# Patient Record
Sex: Female | Born: 1980 | Race: White | Hispanic: No | Smoking: Never smoker
Health system: Southern US, Community
[De-identification: ages and names within clinical notes are randomized; demographics above are authoritative.]

## PROBLEM LIST (undated history)

## (undated) DIAGNOSIS — IMO0002 Reserved for concepts with insufficient information to code with codable children: Secondary | ICD-10-CM

## (undated) DIAGNOSIS — D649 Anemia, unspecified: Secondary | ICD-10-CM

## (undated) DIAGNOSIS — Z800 Family history of malignant neoplasm of digestive organs: Secondary | ICD-10-CM

## (undated) DIAGNOSIS — D219 Benign neoplasm of connective and other soft tissue, unspecified: Secondary | ICD-10-CM

## (undated) DIAGNOSIS — R519 Headache, unspecified: Secondary | ICD-10-CM

## (undated) DIAGNOSIS — Z803 Family history of malignant neoplasm of breast: Secondary | ICD-10-CM

## (undated) DIAGNOSIS — R87619 Unspecified abnormal cytological findings in specimens from cervix uteri: Secondary | ICD-10-CM

## (undated) DIAGNOSIS — Z1501 Genetic susceptibility to malignant neoplasm of breast: Secondary | ICD-10-CM

## (undated) DIAGNOSIS — Z1509 Genetic susceptibility to other malignant neoplasm: Secondary | ICD-10-CM

## (undated) DIAGNOSIS — T7840XA Allergy, unspecified, initial encounter: Secondary | ICD-10-CM

## (undated) DIAGNOSIS — D500 Iron deficiency anemia secondary to blood loss (chronic): Secondary | ICD-10-CM

## (undated) DIAGNOSIS — I82890 Acute embolism and thrombosis of other specified veins: Secondary | ICD-10-CM

## (undated) HISTORY — DX: Headache, unspecified: R51.9

## (undated) HISTORY — DX: Allergy, unspecified, initial encounter: T78.40XA

## (undated) HISTORY — DX: Genetic susceptibility to other malignant neoplasm: Z15.09

## (undated) HISTORY — DX: Family history of malignant neoplasm of breast: Z80.3

## (undated) HISTORY — DX: Genetic susceptibility to malignant neoplasm of breast: Z15.01

## (undated) HISTORY — DX: Iron deficiency anemia secondary to blood loss (chronic): D50.0

## (undated) HISTORY — DX: Benign neoplasm of connective and other soft tissue, unspecified: D21.9

## (undated) HISTORY — PX: ABDOMINAL HYSTERECTOMY: SHX81

## (undated) HISTORY — DX: Family history of malignant neoplasm of digestive organs: Z80.0

## (undated) HISTORY — DX: Acute embolism and thrombosis of other specified veins: I82.890

## (undated) HISTORY — DX: Anemia, unspecified: D64.9

---

## 2004-10-27 LAB — RUBELLA IGG AB(REFL): Rubella: IMMUNE

## 2004-10-27 LAB — RUBEOLA ANTIBODY IGG: MEASLES (RUBEOLA) IGG,IFA: IMMUNE

## 2004-10-27 LAB — VARICELLA ZOSTER ANTIBODY, IGG: Varicella Zoster Ab IgM: IMMUNE

## 2004-10-27 LAB — CHG ANTIBODY MUMPS: Mumps IgG: IMMUNE

## 2006-09-08 ENCOUNTER — Other Ambulatory Visit: Admission: RE | Admit: 2006-09-08 | Discharge: 2006-09-08 | Payer: Self-pay | Admitting: Obstetrics and Gynecology

## 2006-10-12 ENCOUNTER — Emergency Department (HOSPITAL_COMMUNITY): Admission: EM | Admit: 2006-10-12 | Discharge: 2006-10-12 | Payer: Self-pay | Admitting: Emergency Medicine

## 2007-01-08 ENCOUNTER — Other Ambulatory Visit: Admission: RE | Admit: 2007-01-08 | Discharge: 2007-01-08 | Payer: Self-pay | Admitting: Obstetrics and Gynecology

## 2007-02-08 HISTORY — PX: COLPOSCOPY: SHX161

## 2008-05-14 ENCOUNTER — Ambulatory Visit: Payer: Self-pay | Admitting: Family Medicine

## 2008-05-14 DIAGNOSIS — J309 Allergic rhinitis, unspecified: Secondary | ICD-10-CM | POA: Insufficient documentation

## 2008-05-14 DIAGNOSIS — K219 Gastro-esophageal reflux disease without esophagitis: Secondary | ICD-10-CM | POA: Insufficient documentation

## 2008-05-22 ENCOUNTER — Telehealth: Payer: Self-pay | Admitting: Family Medicine

## 2008-05-22 DIAGNOSIS — R1084 Generalized abdominal pain: Secondary | ICD-10-CM | POA: Insufficient documentation

## 2008-06-18 ENCOUNTER — Ambulatory Visit: Payer: Self-pay | Admitting: Gastroenterology

## 2008-06-18 DIAGNOSIS — R1013 Epigastric pain: Secondary | ICD-10-CM | POA: Insufficient documentation

## 2008-06-18 DIAGNOSIS — K644 Residual hemorrhoidal skin tags: Secondary | ICD-10-CM | POA: Insufficient documentation

## 2008-06-18 DIAGNOSIS — K5900 Constipation, unspecified: Secondary | ICD-10-CM | POA: Insufficient documentation

## 2008-06-27 ENCOUNTER — Ambulatory Visit (HOSPITAL_COMMUNITY): Admission: RE | Admit: 2008-06-27 | Discharge: 2008-06-27 | Payer: Self-pay | Admitting: Gastroenterology

## 2008-11-28 ENCOUNTER — Ambulatory Visit: Payer: Self-pay | Admitting: Family Medicine

## 2008-11-28 DIAGNOSIS — R5383 Other fatigue: Secondary | ICD-10-CM

## 2008-11-28 DIAGNOSIS — R5381 Other malaise: Secondary | ICD-10-CM | POA: Insufficient documentation

## 2008-12-01 LAB — CONVERTED CEMR LAB
Albumin: 4.4 g/dL (ref 3.5–5.2)
BUN: 9 mg/dL (ref 6–23)
Basophils Absolute: 0 10*3/uL (ref 0.0–0.1)
Basophils Relative: 0 % (ref 0–1)
Calcium: 9.4 mg/dL (ref 8.4–10.5)
Direct LDL: 59 mg/dL
Eosinophils Relative: 1 % (ref 0–5)
Glucose, Bld: 96 mg/dL (ref 70–99)
HCT: 36.3 % (ref 36.0–46.0)
Hemoglobin: 12.1 g/dL (ref 12.0–15.0)
MCHC: 33.3 g/dL (ref 30.0–36.0)
Monocytes Absolute: 0.4 10*3/uL (ref 0.1–1.0)
Neutro Abs: 5.2 10*3/uL (ref 1.7–7.7)
RDW: 13.2 % (ref 11.5–15.5)
Total Bilirubin: 0.4 mg/dL (ref 0.3–1.2)

## 2009-01-19 ENCOUNTER — Telehealth: Payer: Self-pay | Admitting: Family Medicine

## 2009-01-19 ENCOUNTER — Ambulatory Visit: Payer: Self-pay | Admitting: Family Medicine

## 2009-01-19 DIAGNOSIS — J020 Streptococcal pharyngitis: Secondary | ICD-10-CM | POA: Insufficient documentation

## 2009-01-21 ENCOUNTER — Ambulatory Visit: Payer: Self-pay | Admitting: Family Medicine

## 2009-01-21 ENCOUNTER — Telehealth: Payer: Self-pay | Admitting: Family Medicine

## 2009-02-02 ENCOUNTER — Ambulatory Visit: Payer: Self-pay | Admitting: Family Medicine

## 2009-02-02 DIAGNOSIS — K120 Recurrent oral aphthae: Secondary | ICD-10-CM | POA: Insufficient documentation

## 2009-02-02 LAB — CONVERTED CEMR LAB
Specific Gravity, Urine: 1.02
Urobilinogen, UA: 0.2
WBC Urine, dipstick: NEGATIVE

## 2009-03-03 ENCOUNTER — Ambulatory Visit: Payer: Self-pay | Admitting: Family Medicine

## 2009-03-03 ENCOUNTER — Encounter: Payer: Self-pay | Admitting: Family Medicine

## 2009-06-04 ENCOUNTER — Ambulatory Visit: Payer: Self-pay | Admitting: Family Medicine

## 2009-06-04 DIAGNOSIS — S139XXA Sprain of joints and ligaments of unspecified parts of neck, initial encounter: Secondary | ICD-10-CM | POA: Insufficient documentation

## 2009-06-24 ENCOUNTER — Ambulatory Visit: Payer: Self-pay | Admitting: Family Medicine

## 2009-09-02 ENCOUNTER — Ambulatory Visit: Payer: Self-pay | Admitting: Family Medicine

## 2009-09-02 DIAGNOSIS — M25819 Other specified joint disorders, unspecified shoulder: Secondary | ICD-10-CM | POA: Insufficient documentation

## 2009-09-02 DIAGNOSIS — M7580 Other shoulder lesions, unspecified shoulder: Secondary | ICD-10-CM

## 2009-10-14 LAB — CONVERTED CEMR LAB: Pap Smear: NORMAL

## 2009-10-26 ENCOUNTER — Telehealth: Payer: Self-pay | Admitting: Family Medicine

## 2009-11-04 ENCOUNTER — Ambulatory Visit: Payer: Self-pay | Admitting: Family Medicine

## 2009-11-04 LAB — CONVERTED CEMR LAB
Basophils Absolute: 0 10*3/uL (ref 0.0–0.1)
Calcium: 9.2 mg/dL (ref 8.4–10.5)
Chloride: 105 meq/L (ref 96–112)
Cholesterol: 151 mg/dL (ref 0–200)
Creatinine, Ser: 0.8 mg/dL (ref 0.4–1.2)
Eosinophils Absolute: 0.1 10*3/uL (ref 0.0–0.7)
GFR calc non Af Amer: 98.35 mL/min (ref >60)
HCT: 36.4 % (ref 36.0–46.0)
LDL Cholesterol: 76 mg/dL (ref 0–99)
Lymphs Abs: 1.9 10*3/uL (ref 0.7–4.0)
MCHC: 33.9 g/dL (ref 30.0–36.0)
Monocytes Relative: 6.4 % (ref 3.0–12.0)
Platelets: 213 10*3/uL (ref 150.0–400.0)
RDW: 13.3 % (ref 11.5–14.6)
TSH: 1.36 microintl units/mL (ref 0.35–5.50)
Triglycerides: 49 mg/dL (ref 0.0–149.0)

## 2009-11-05 ENCOUNTER — Encounter: Payer: Self-pay | Admitting: Family Medicine

## 2010-02-07 NOTE — L&D Delivery Note (Signed)
Patient was C/C/+2 and pushed for 0.1 hours with epidural.   NSVD  female infant, Apgars 8,8, weight P.   The patient had no lacerations. Fundus was firm. EBL was expected. Placenta was delivered intact. Vagina was clear.  Baby was vigorous to bedside.

## 2010-03-04 LAB — RUBELLA ANTIBODY, IGM: Rubella: IMMUNE

## 2010-03-04 LAB — ABO/RH: RH Type: POSITIVE

## 2010-03-04 LAB — HIV ANTIBODY (ROUTINE TESTING W REFLEX): HIV: NONREACTIVE

## 2010-03-04 LAB — TYPE AND SCREEN: Antibody Screen: NEGATIVE

## 2010-03-09 NOTE — Progress Notes (Signed)
Summary: ? About lab work  Phone Note Call from Patient Call back at [phone]   Caller: Patient Call For: Dr. Dayton Martes Summary of Call: Patient is scheduled for a physical with you on 11/04/09 at 8:30 AM. Patient wants to know if you want her to have labs prior to this appt. or do you just want her to have them when she comes in for that appt? Please advise. Initial call taken by: Sydell Axon LPN,  October 26, 2009 1:46 PM  Follow-up for Phone Call        Since it's in the morning, we can just do them on the day of her appointment. Ruthe Mannan MD  October 26, 2009 1:51 PM   Additional Follow-up for Phone Call Additional follow up Details #1::        Patient notified as instructed by telephone. Additional Follow-up by: Sydell Axon LPN,  October 26, 2009 1:53 PM

## 2010-03-09 NOTE — Assessment & Plan Note (Signed)
Summary: CPX/rl   Vital Signs:  Patient profile:   30 year old female Height:      64 inches Weight:      130 pounds BMI:     22.40 Temp:     98.2 degrees F oral Pulse rate:   72 / minute Pulse rhythm:   regular BP sitting:   102 / 80  (left arm) Cuff size:   regular  Vitals Entered By: Linde Gillis CMA Duncan Dull) (November 04, 2009 8:17 AM) CC: adult physicial, no pap   History of Present Illness: 30 yo here for CPX.  Doing really well. Her son Ree Kida, 19 year old, doing well in school. Lurena Joiner and her husband are thinking about having another child.  Just had her pap at OBGYN, normal. No complaints.   Still tired at times.  No SOB, CP, LE edema. Denies any symptoms of hypo or hyperthyroidism.  Current Medications (verified): 1)  Multivitamins   Tabs (Multiple Vitamin) .... Take 1 Tablet By Mouth Once A Day 2)  Fish Oil 1000 Mg Caps (Omega-3 Fatty Acids) .... Take One Tablet By Mouth Daily 3)  Vitamin B-12 1000 Mcg Tabs (Cyanocobalamin) .... Take One Tablet By Mouth Daily  Allergies (verified): No Known Drug Allergies  Past History:  Past Medical History: Last updated: 11/28/2008 Allergic rhinitis h/o abnormal Paps in past, normal colpo  Past Surgical History: Last updated: 05/14/2008 NSVD x 1  Family History: Last updated: 06/18/2008 Alcoholism, Drug Addiction: 0 Colon CA: GP Ovarian/Uterine CA: 0 Breast CA: GP Lung CA: GP Prostate CA: 0 CAD: 0 CVA: 0 Sudden death < 50: 0 DM: 0 Mental Illness: 0  Colon cancer: MGM age 61 Pancreatic cancer: M aunt  Social History: Last updated: 11/04/2009 Marital Status: Married Children:62 year old son, Ree Kida Occupation: student,  CMA program Never Smoked Alcohol use-yes rare Drug use-no Regular exercise-yes  Risk Factors: Alcohol Use: <1 (11/28/2008) Diet: Good diet,  balanced, minimal junk food (11/28/2008) Exercise: yes (11/28/2008)  Risk Factors: Smoking Status: never (11/28/2008)  Social  History: Marital Status: Married Children:57 year old son, Ree Kida Occupation: Consulting civil engineer,  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 Eyes:  Denies blurring. ENT:  Denies difficulty swallowing. CV:  Denies chest pain or discomfort. Resp:  Denies shortness of breath. GI:  Denies abdominal pain, change in bowel habits, and dark tarry stools. GU:  Denies discharge, incontinence, urinary frequency, and urinary hesitancy. MS:  Denies joint pain, joint redness, and joint swelling. Derm:  Denies rash. Neuro:  Denies headaches. Psych:  Denies anxiety and depression. Endo:  Denies cold intolerance, heat intolerance, polyuria, and weight change. Heme:  Denies abnormal bruising and bleeding.  Physical Exam  General:  Well-developed,well-nourished,in no acute distress; alert,appropriate and cooperative throughout examination Head:  Normocephalic and atraumatic without obvious abnormalities. No apparent alopecia or balding. Eyes:  vision grossly intact.   Ears:  External ear exam shows no significant lesions or deformities.  Otoscopic examination reveals clear canals, tympanic membranes are intact bilaterally without bulging, retraction, inflammation or discharge. Hearing is grossly normal bilaterally. Nose:  External nasal examination shows no deformity or inflammation. Nasal mucosa are pink and moist without lesions or exudates. Mouth:  pharyngeal erythema approved. no exudates now. gums look less red, no foul odor. apthous ulcers on roof of mouth, nothing on gums. Neck:  No deformities, masses, or tenderness noted. Lungs:  Normal respiratory effort, chest expands symmetrically. Lungs are clear to auscultation, no crackles or wheezes. Heart:  Normal rate and regular rhythm. S1 and S2 normal without gallop, murmur, click, rub or other extra sounds. Abdomen:  Bowel sounds  positive,abdomen soft and non-tender without masses, organomegaly or hernias noted. Msk:  Left arm:  FROM, neg arch, neg empty can.  Extremities:  No clubbing, cyanosis, edema, or deformity noted with normal full range of motion of all joints.   Neurologic:  No cranial nerve deficits noted. Station and gait are normal. Plantar reflexes are down-going bilaterally. DTRs are symmetrical throughout. Sensory, motor and coordinative functions appear intact. Skin:  multiple nevi on shoulders and back, none appear dysplastic Cervical Nodes:  No lymphadenopathy noted Psych:  Cognition and judgment appear intact. Alert and cooperative with normal attention span and concentration. No apparent delusions, illusions, hallucinations   Impression & Recommendations:  Problem # 1:  PHYSICAL EXAMINATION (ICD-V70.0) Reviewed preventive care protocols, scheduled due services, and updated immunizations Discussed nutrition, exercise, diet, and healthy lifestyle.  FLP, BMET today. Deferred flu shot.  Problem # 2:  FATIGUE (ICD-780.79) Assessment: Unchanged Likely due to busy lifestyle but will check CBC, TSH, BMET, VIt D to rule out other reversible causes. Orders: TLB-CBC Platelet - w/Differential (85025-CBCD) TLB-TSH (Thyroid Stimulating Hormone) (84443-TSH) TLB-BMP (Basic Metabolic Panel-BMET) (80048-METABOL)  Complete Medication List: 1)  Multivitamins Tabs (Multiple vitamin) .... Take 1 tablet by mouth once a day 2)  Fish Oil 1000 Mg Caps (Omega-3 fatty acids) .... Take one tablet by mouth daily 3)  Vitamin B-12 1000 Mcg Tabs (Cyanocobalamin) .... Take one tablet by mouth daily  Other Orders: Venipuncture (18841) TLB-Lipid Panel (80061-LIPID) T-Vitamin D (25-Hydroxy) (66063-01601)  Current Allergies (reviewed today): No known allergies   TD Result Date:  10/23/2001 TD Result:  historical Last PAP:  normal (10/08/2008 3:49:57 PM) PAP Result Date:  10/14/2009 PAP Result:  normal

## 2010-03-09 NOTE — Letter (Signed)
Summary: Proof of TB Test Form  Proof of TB Test Form   Imported By: Lanelle Bal 03/11/2009 14:12:22  _____________________________________________________________________  External Attachment:    Type:   Image     Comment:   External Document

## 2010-03-09 NOTE — Letter (Signed)
Summary: Generic Letter  [facility]  [address] [place], [place] [address]   Phone: [phone]  Fax: [phone]    11/05/2009  [facility] [place] [address] [place], [place]  [address]  Dear Ms. Shells,     We have received your lab results and your Vitamin D level is normal.      Sincerely,        Linde Gillis CMA (AAMA)for Dr. Ruthe Mannan

## 2010-03-09 NOTE — Assessment & Plan Note (Signed)
Summary: 3:15 arm pain/dlo   Vital Signs:  Patient profile:   30 year old female Height:      64 inches Weight:      133 pounds BMI:     22.91 Temp:     98.3 degrees F oral Pulse rate:   72 / minute Pulse rhythm:   regular BP sitting:   122 / 70  (left arm) Cuff size:   regular  Vitals Entered By: Linde Gillis CMA Duncan Dull) (September 02, 2009 12:11 PM) CC: left arm pain   History of Present Illness: 30 yo here for left arm pain.  Was using a garden tool 2 weeks ago when pain started. started in left shoulder, pain radiates into 2nd and 3rd left finger. Sometimes some tingling, no numbness. Improved since she stopped doing same repetitive movement. Ibuprofen helps a little. No arm weakness.  Current Medications (verified): 1)  Multivitamins   Tabs (Multiple Vitamin) .... Take 1 Tablet By Mouth Once A Day 2)  Fish Oil 1000 Mg Caps (Omega-3 Fatty Acids) .... Take One Tablet By Mouth Daily 3)  Vitamin B-12 1000 Mcg Tabs (Cyanocobalamin) .... Take One Tablet By Mouth Daily 4)  Meloxicam 15 Mg Tabs (Meloxicam) .Marland Kitchen.. 1 Tab By Mouth Daily As Needed Pain  Allergies (verified): No Known Drug Allergies  Past History:  Past Medical History: Last updated: 11/28/2008 Allergic rhinitis h/o abnormal Paps in past, normal colpo  Past Surgical History: Last updated: 05/14/2008 NSVD x 1  Family History: Last updated: 06/18/2008 Alcoholism, Drug Addiction: 0 Colon CA: GP Ovarian/Uterine CA: 0 Breast CA: GP Lung CA: GP Prostate CA: 0 CAD: 0 CVA: 0 Sudden death < 50: 0 DM: 0 Mental Illness: 0  Colon cancer: MGM age 43 Pancreatic cancer: M aunt  Social History: Last updated: 06/18/2008 Marital Status: Married Children: 64 55 year old son Occupation: Consulting civil engineer,  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  Physical Exam  General:  Well-developed,well-nourished,in no acute distress; alert,appropriate and cooperative throughout examination Msk:  Left arm:  FROM, neg arch, neg empty can.  Neurologic:  No cranial nerve deficits noted. Station and gait are normal. Plantar reflexes are down-going bilaterally. DTRs are symmetrical throughout. Sensory, motor and coordinative functions appear intact. Psych:  Cognition and judgment appear intact. Alert and cooperative with normal attention span and concentration. 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 Advised to stop using that garden tool or at least try to use both arms. Pt in agreement with plan.  Complete Medication List: 1)  Multivitamins Tabs (Multiple vitamin) .... Take 1 tablet by mouth once a day 2)  Fish Oil 1000 Mg Caps (Omega-3 fatty acids) .... Take one tablet by mouth daily 3)  Vitamin B-12 1000 Mcg Tabs (Cyanocobalamin) .... Take one tablet by mouth daily 4)  Meloxicam 15 Mg Tabs (Meloxicam) .Marland Kitchen.. 1 tab by mouth daily as needed pain Prescriptions: MELOXICAM 15 MG TABS (MELOXICAM) 1 tab by mouth daily as needed pain  #30 x 0   Entered and Authorized by:   Ruthe Mannan MD   Signed  by:   Ruthe Mannan MD on 09/02/2009   Method used:   Electronically to        [facility] [address] (retail)       [address]       [place], [place]  [address]       Ph: [phone]       Fax: [phone]   RxID:   [id number]   Current Allergies (reviewed today): No known allergies

## 2010-03-09 NOTE — Assessment & Plan Note (Signed)
Summary: PULLED MUSCLE IN NECK/DLO   Vital Signs:  Patient profile:   30 year old female Height:      64 inches Weight:      135.13 pounds BMI:     23.28 Temp:     97.9 degrees F oral Pulse rate:   88 / minute Pulse rhythm:   regular BP sitting:   106 / 60  (left arm) Cuff size:   regular  Vitals Entered By: Delilah Shan CMA Duncan Dull) (June 04, 2009 10:48 AM) CC: Pulled muscle in neck   History of Present Illness: 30 yo here for pulled muscle in neck.  Has always carried her heavy back on her right shoulder. Noticed for past week that her neck is very stiff, hurts to turn head to right. No UE radiculapthy or muscle weakness. Icy hot not helping.  Current Medications (verified): 1)  Multivitamins   Tabs (Multiple Vitamin) .... Take 1 Tablet By Mouth Once A Day 2)  Cyclobenzaprine Hcl 10 Mg  Tabs (Cyclobenzaprine Hcl) .Marland Kitchen.. 1 By Mouth 2 Times Daily As Needed For Back Pain  Allergies (verified): No Known Drug Allergies  Review of Systems      See HPI Neuro:  Denies tingling and weakness.  Physical Exam  General:  Well-developed,well-nourished,in no acute distress; alert,appropriate and cooperative throughout examination Msk:  normal ROM.   Normal Stregth and sensation of UE. Non tender over cervical spine.   Impression & Recommendations:  Problem # 1:  NECK SPRAIN AND STRAIN (ICD-847.0) Assessment New Flexeril, continue warmth. Call me in 2 weeks to let me know how she is doing. Also discussed exercises. Her updated medication list for this problem includes:    Cyclobenzaprine Hcl 10 Mg Tabs (Cyclobenzaprine hcl) .Marland Kitchen... 1 by mouth 2 times daily as needed for back pain  Complete Medication List: 1)  Multivitamins Tabs (Multiple vitamin) .... Take 1 tablet by mouth once a day 2)  Cyclobenzaprine Hcl 10 Mg Tabs (Cyclobenzaprine hcl) .Marland Kitchen.. 1 by mouth 2 times daily as needed for back pain Prescriptions: CYCLOBENZAPRINE HCL 10 MG  TABS (CYCLOBENZAPRINE HCL) 1 by mouth 2  times daily as needed for back pain  #30 x 0   Entered and Authorized by:   Ruthe Mannan MD   Signed by:   Ruthe Mannan MD on 06/04/2009   Method used:   Electronically to        [facility] [address] (retail)       [address]       [place], [place]  [address]       Ph: [phone]       Fax: [phone]   RxID:   [id number]   Current Allergies (reviewed today): No known allergies

## 2010-03-09 NOTE — Assessment & Plan Note (Signed)
Summary: TB TEST/ARON/CLE  Nurse Visit   Allergies: No Known Drug Allergies  Immunizations Administered:  PPD Skin Test:    Vaccine Type: PPD    Site: left forearm    Mfr: Sanofi Pasteur    Dose: 0.1 ml    Route: ID    Given by: Linde Gillis CMA (AAMA)    Exp. Date: 04/15/2011    Lot #: [id number]  PPD Results    Date of reading: 03/05/2009    Results: < 5mm    Interpretation: negative  Orders Added: 1)  TB Skin Test [86580] 2)  Admin 1st Vaccine [45409]

## 2010-03-09 NOTE — Letter (Signed)
Summary: Generic Letter  [facility]  [address] [place], [place] [address]   Phone: [phone]  Fax: [phone]    11/04/2009  [facility] [place] [address] [place], [place]  [address]  Dear [name],     We have received your lab results and Dr. Dayton Martes says that all of your labs look great!!  Keep up the good work.  Enclosed is a copy of your lab results.      Sincerely,        Linde Gillis CMA (AAMA)for Dr. Ruthe Mannan

## 2010-08-22 ENCOUNTER — Encounter (HOSPITAL_COMMUNITY): Payer: Self-pay | Admitting: Anesthesiology

## 2010-08-22 ENCOUNTER — Inpatient Hospital Stay (HOSPITAL_COMMUNITY): Payer: 59 | Admitting: Anesthesiology

## 2010-08-22 ENCOUNTER — Encounter (HOSPITAL_COMMUNITY): Payer: Self-pay | Admitting: Obstetrics and Gynecology

## 2010-08-22 ENCOUNTER — Inpatient Hospital Stay (HOSPITAL_COMMUNITY)
Admission: AD | Admit: 2010-08-22 | Discharge: 2010-08-24 | DRG: 775 | Disposition: A | Discharge status: Home / Self Care | Payer: 59 | Source: Ambulatory Visit | Attending: Obstetrics and Gynecology | Admitting: Obstetrics and Gynecology

## 2010-08-22 HISTORY — DX: Reserved for concepts with insufficient information to code with codable children: IMO0002

## 2010-08-22 HISTORY — DX: Unspecified abnormal cytological findings in specimens from cervix uteri: R87.619

## 2010-08-22 LAB — CBC
HCT: 39.2 % (ref 36.0–46.0)
HCT: 37.4 % (ref 36.0–46.0)
Hemoglobin: 12.5 g/dL (ref 12.0–15.0)
MCHC: 33.4 g/dL (ref 30.0–36.0)
MCV: 89.1 fL (ref 78.0–100.0)
MCV: 89.5 fL (ref 78.0–100.0)
Platelets: 139 10*3/uL — ABNORMAL LOW (ref 150–400)
RBC: 4.18 MIL/uL (ref 3.87–5.11)
RDW: 14.4 % (ref 11.5–15.5)
WBC: 16.5 10*3/uL — ABNORMAL HIGH (ref 4.0–10.5)

## 2010-08-22 LAB — POCT FERN TEST: Fern Test: POSITIVE

## 2010-08-22 LAB — RPR: RPR Ser Ql: NONREACTIVE

## 2010-08-22 MED ORDER — SODIUM CHLORIDE 0.9 % IJ SOLN: 3.0000 mL | Freq: Two times a day (BID) | INTRAMUSCULAR | Status: DC

## 2010-08-22 MED ORDER — TERBUTALINE SULFATE 1 MG/ML IJ SOLN: 0.2500 mg | Freq: Once | INTRAMUSCULAR | Status: DC | PRN

## 2010-08-22 MED ORDER — LACTATED RINGERS IV SOLN
INTRAVENOUS | Status: DC
  Administered 2010-08-22: 11:00:00 via INTRAVENOUS

## 2010-08-22 MED ORDER — MEASLES, MUMPS & RUBELLA VAC ~~LOC~~ INJ
0.5000 mL | INJECTION | Freq: Once | SUBCUTANEOUS | Status: DC
  Filled 2010-08-22: qty 0.5

## 2010-08-22 MED ORDER — FENTANYL 2.5 MCG/ML BUPIVACAINE 1/10 % EPIDURAL INFUSION (WH - ANES)
14.0000 mL/h | INTRAMUSCULAR | Status: DC
  Administered 2010-08-22: 14 mL/h via EPIDURAL
  Filled 2010-08-22: qty 60

## 2010-08-22 MED ORDER — BENZOCAINE-MENTHOL 20-0.5 % EX AERO
1.0000 "application " | INHALATION_SPRAY | CUTANEOUS | Status: DC | PRN
  Administered 2010-08-22: 1 via TOPICAL
  Filled 2010-08-22: qty 56

## 2010-08-22 MED ORDER — SODIUM CHLORIDE 0.9 % IV SOLN: 250.0000 mL | INTRAVENOUS | Status: DC

## 2010-08-22 MED ORDER — DIPHENHYDRAMINE HCL 50 MG/ML IJ SOLN: 12.5000 mg | INTRAMUSCULAR | Status: DC | PRN

## 2010-08-22 MED ORDER — BENZOCAINE-MENTHOL 20-0.5 % EX AERO
INHALATION_SPRAY | CUTANEOUS | Status: AC
  Administered 2010-08-22: 1 via TOPICAL
  Filled 2010-08-22: qty 56

## 2010-08-22 MED ORDER — SENNOSIDES-DOCUSATE SODIUM 8.6-50 MG PO TABS
1.0000 | ORAL_TABLET | Freq: Every day | ORAL | Status: DC
  Administered 2010-08-22: 1 via ORAL
  Administered 2010-08-23: 2 via ORAL

## 2010-08-22 MED ORDER — OXYTOCIN 20 UNITS IN LACTATED RINGERS INFUSION - SIMPLE
1.0000 m[IU]/min | INTRAVENOUS | Status: DC
  Administered 2010-08-22: 2 m[IU]/min via INTRAVENOUS
  Filled 2010-08-22: qty 1000

## 2010-08-22 MED ORDER — WITCH HAZEL-GLYCERIN EX PADS: MEDICATED_PAD | CUTANEOUS | Status: DC | PRN

## 2010-08-22 MED ORDER — OXYCODONE-ACETAMINOPHEN 5-325 MG PO TABS
1.0000 | ORAL_TABLET | ORAL | Status: DC | PRN
  Administered 2010-08-23: 1 via ORAL
  Filled 2010-08-22 (×6): qty 2

## 2010-08-22 MED ORDER — OXYTOCIN 20 UNITS IN LACTATED RINGERS INFUSION - SIMPLE: 125.0000 mL/h | Freq: Once | INTRAVENOUS | Status: DC

## 2010-08-22 MED ORDER — OXYCODONE-ACETAMINOPHEN 5-325 MG PO TABS: 2.0000 | ORAL_TABLET | ORAL | Status: DC | PRN

## 2010-08-22 MED ORDER — LANOLIN HYDROUS EX OINT
TOPICAL_OINTMENT | CUTANEOUS | Status: DC | PRN
  Administered 2010-08-23: 3 via TOPICAL

## 2010-08-22 MED ORDER — PHENYLEPHRINE 40 MCG/ML (10ML) SYRINGE FOR IV PUSH (FOR BLOOD PRESSURE SUPPORT)
80.0000 ug | PREFILLED_SYRINGE | INTRAVENOUS | Status: DC | PRN
  Filled 2010-08-22 (×2): qty 5

## 2010-08-22 MED ORDER — SODIUM CHLORIDE 0.9 % IJ SOLN: 3.0000 mL | INTRAMUSCULAR | Status: DC | PRN

## 2010-08-22 MED ORDER — LIDOCAINE HCL (PF) 1 % IJ SOLN
30.0000 mL | INTRAMUSCULAR | Status: DC | PRN
  Filled 2010-08-22: qty 30

## 2010-08-22 MED ORDER — ONDANSETRON HCL 4 MG/2ML IJ SOLN: 4.0000 mg | INTRAMUSCULAR | Status: DC | PRN

## 2010-08-22 MED ORDER — LACTATED RINGERS IV SOLN: 500.0000 mL | Freq: Once | INTRAVENOUS | Status: DC

## 2010-08-22 MED ORDER — IBUPROFEN 800 MG PO TABS
800.0000 mg | ORAL_TABLET | Freq: Three times a day (TID) | ORAL | Status: DC
  Administered 2010-08-22 – 2010-08-24 (×5): 800 mg via ORAL
  Filled 2010-08-22 (×6): qty 1

## 2010-08-22 MED ORDER — CITRIC ACID-SODIUM CITRATE 334-500 MG/5ML PO SOLN: 30.0000 mL | ORAL | Status: DC | PRN

## 2010-08-22 MED ORDER — TETANUS-DIPHTH-ACELL PERTUSSIS 5-2.5-18.5 LF-MCG/0.5 IM SUSP
0.5000 mL | Freq: Once | INTRAMUSCULAR | Status: AC
  Administered 2010-08-23: 0.5 mL via INTRAMUSCULAR
  Filled 2010-08-22: qty 0.5

## 2010-08-22 MED ORDER — LIDOCAINE HCL 1.5 % IJ SOLN
INTRAMUSCULAR | Status: DC | PRN
  Administered 2010-08-22 (×2): 4 mL

## 2010-08-22 MED ORDER — ACETAMINOPHEN 325 MG PO TABS: 650.0000 mg | ORAL_TABLET | ORAL | Status: DC | PRN

## 2010-08-22 MED ORDER — FLEET ENEMA 7-19 GM/118ML RE ENEM: 1.0000 | ENEMA | RECTAL | Status: DC | PRN

## 2010-08-22 MED ORDER — DIPHENHYDRAMINE HCL 25 MG PO CAPS: 25.0000 mg | ORAL_CAPSULE | Freq: Four times a day (QID) | ORAL | Status: DC | PRN

## 2010-08-22 MED ORDER — ONDANSETRON HCL 4 MG PO TABS: 4.0000 mg | ORAL_TABLET | ORAL | Status: DC | PRN

## 2010-08-22 MED ORDER — ONDANSETRON HCL 4 MG/2ML IJ SOLN: 4.0000 mg | Freq: Four times a day (QID) | INTRAMUSCULAR | Status: DC | PRN

## 2010-08-22 MED ORDER — NALBUPHINE SYRINGE 5 MG/0.5 ML
5.0000 mg | INJECTION | INTRAMUSCULAR | Status: DC | PRN
  Administered 2010-08-22: 5 mg via INTRAVENOUS
  Filled 2010-08-22 (×2): qty 0.5

## 2010-08-22 MED ORDER — LACTATED RINGERS IV SOLN: 500.0000 mL | INTRAVENOUS | Status: DC | PRN

## 2010-08-22 MED ORDER — ZOLPIDEM TARTRATE 10 MG PO TABS: 10.0000 mg | ORAL_TABLET | Freq: Every evening | ORAL | Status: DC | PRN

## 2010-08-22 MED ORDER — MAGNESIUM HYDROXIDE 400 MG/5ML PO SUSP: 30.0000 mL | ORAL | Status: DC | PRN

## 2010-08-22 MED ORDER — EPHEDRINE 5 MG/ML INJ
10.0000 mg | INTRAVENOUS | Status: DC | PRN
  Filled 2010-08-22 (×2): qty 4

## 2010-08-22 MED ORDER — FERROUS SULFATE 325 (65 FE) MG PO TABS
325.0000 mg | ORAL_TABLET | Freq: Two times a day (BID) | ORAL | Status: DC
  Administered 2010-08-23 – 2010-08-24 (×3): 325 mg via ORAL
  Filled 2010-08-22 (×5): qty 1

## 2010-08-22 MED ORDER — EPHEDRINE 5 MG/ML INJ
10.0000 mg | INTRAVENOUS | Status: DC | PRN
  Filled 2010-08-22: qty 4

## 2010-08-22 MED ORDER — ZOLPIDEM TARTRATE 5 MG PO TABS: 5.0000 mg | ORAL_TABLET | Freq: Every evening | ORAL | Status: DC | PRN

## 2010-08-22 MED ORDER — SIMETHICONE 80 MG PO CHEW: 80.0000 mg | CHEWABLE_TABLET | ORAL | Status: DC | PRN

## 2010-08-22 MED ORDER — PRENATAL PLUS 27-1 MG PO TABS
1.0000 | ORAL_TABLET | Freq: Every day | ORAL | Status: DC
  Administered 2010-08-23 – 2010-08-24 (×3): 1 via ORAL
  Filled 2010-08-22 (×2): qty 1

## 2010-08-22 MED ORDER — PHENYLEPHRINE 40 MCG/ML (10ML) SYRINGE FOR IV PUSH (FOR BLOOD PRESSURE SUPPORT)
80.0000 ug | PREFILLED_SYRINGE | INTRAVENOUS | Status: DC | PRN
  Filled 2010-08-22: qty 5

## 2010-08-22 MED ORDER — IBUPROFEN 600 MG PO TABS: 600.0000 mg | ORAL_TABLET | Freq: Four times a day (QID) | ORAL | Status: DC | PRN

## 2010-08-22 NOTE — Anesthesia Procedure Notes (Signed)
Epidural Patient location during procedure: OB Start time: 08/22/2010 1:59 PM  Staffing Anesthesiologist: Daiwik Buffalo A. Performed by: anesthesiologist   Preanesthetic Checklist Completed: patient identified, site marked, surgical consent, pre-op evaluation, timeout performed, IV checked, risks and benefits discussed and monitors and equipment checked  Epidural Patient position: sitting Prep: site prepped and draped and DuraPrep Patient monitoring: continuous pulse ox and blood pressure Approach: midline Injection technique: LOR air  Needle Needle type: Tuohy  Needle gauge: 17 G Needle length: 9 cm Catheter type: closed end flexible Catheter size: 19 Gauge Test dose: negative and 1.5% lidocaine  Assessment Events: blood not aspirated, injection not painful, no injection resistance, negative IV test and no paresthesia  Additional Notes Pt. Tolerated procedure well and feeling less pain. Refer to RN notes for Pt. VS & FHR

## 2010-08-22 NOTE — Progress Notes (Signed)
Trans to [facility], Rm 105 via wheelchair, baby in arms.  Accompanied by RN & FOB. Stable s s/s of acute distress.  Report & pt care given to MB RN.

## 2010-08-22 NOTE — Progress Notes (Signed)
No change in cervix for 4 hours. NST R. Pitocin augmentation.

## 2010-08-22 NOTE — H&P (Signed)
Patient comes in c/o labor and SROM clear @ 0200.  Otherwise has good fetal movement and no bleeding.  Past Medical History  Diagnosis Date  . Abnormal Pap smear     Past Surgical History  Procedure Date  . Colposcopy 2009    OB History    Grav Para Term Preterm Abortions TAB SAB Ect Mult Living   2 1 1  0 0 0 0 0 0 1     # Outc Date GA Lbr Len/2nd Wgt Sex Del Anes PTL Lv   1 TRM            2 CUR               History   Social History  . Marital Status: Married    Spouse Name: N/A    Number of Children: N/A  . Years of Education: N/A   Occupational History  . Not on file.   Social History Main Topics  . Smoking status: Never Smoker   . Smokeless tobacco: Not on file  . Alcohol Use: No  . Drug Use: No  . Sexually Active: Yes    Birth Control/ Protection: None   Other Topics Concern  . Not on file   Social History Narrative  . No narrative on file   Review of patient's allergies indicates no known allergies.   Prenatal Course:  uncomp.  Filed Vitals:   08/22/10 0736  BP: 103/58  Pulse: 81  Temp: 97.5 F (36.4 C)  Resp: 18     Lungs/Cor:  NAD Abdomen:  soft, gravid Ex:  no cords, erythema SVE:  3/50/-2 FHTs:  140s, good STV, NST R Toco:  q3   A/P   Admit for labor.  GBS neg.  Idalee Foxworthy A

## 2010-08-22 NOTE — Progress Notes (Signed)
(+)   Fern slide

## 2010-08-22 NOTE — Progress Notes (Signed)
Asked Colin Mulders, RNC (charge RN) to monitor pt while I admit another pt.

## 2010-08-22 NOTE — Anesthesia Preprocedure Evaluation (Signed)
Anesthesia Evaluation  Name, MR# and DOB Patient awake  General Assessment Comment  Reviewed: Allergy & Precautions, H&P  and Patient's Chart, lab work & pertinent test results  Airway Mallampati: II TM Distance: >3 FB Neck ROM: full    Dental No notable dental hx (+) Teeth Intact   Pulmonaryneg pulmonary ROS    clear to auscultation  pulmonary exam normal   Cardiovascular regular Normal   Neuro/Psych  GI/Hepatic/Renal negative GI ROS, negative Liver ROS, and negative Renal ROS (+)       Endo/Other  Negative Endocrine ROS (+)   Abdominal   Musculoskeletal  Hematology negative hematology ROS (+)   Peds  Reproductive/Obstetrics (+) Pregnancy   Anesthesia Other Findings             Anesthesia Physical Anesthesia Plan  ASA: II  Anesthesia Plan: Epidural   Post-op Pain Management:    Induction:   Airway Management Planned:   Additional Equipment:   Intra-op Plan:   Post-operative Plan:   Informed Consent: I have reviewed the patients History and Physical, chart, labs and discussed the procedure including the risks, benefits and alternatives for the proposed anesthesia with the patient or authorized representative who has indicated his/her understanding and acceptance.     Plan Discussed with: Anesthesiologist  Anesthesia Plan Comments:         Anesthesia Quick Evaluation

## 2010-08-22 NOTE — Progress Notes (Cosign Needed)
"  I felt a pop and then leaking started about 0230.  UC's started a little before then.  UC's 3-5 mins."

## 2010-08-23 LAB — CBC
HCT: 33.3 % — ABNORMAL LOW (ref 36.0–46.0)
Hemoglobin: 11.2 g/dL — ABNORMAL LOW (ref 12.0–15.0)
MCH: 29.9 pg (ref 26.0–34.0)
MCHC: 33.6 g/dL (ref 30.0–36.0)

## 2010-08-23 NOTE — Progress Notes (Signed)
Mom has positional stripe/bruise bil nipples, assisted with deeper latch and positioning. Brochure from lactation dept reviewed with patient. Community resources for breastfeeding support given to mom. Advised of outpatient services available if needed.

## 2010-08-24 MED ORDER — IBUPROFEN 600 MG PO TABS: 600.0000 mg | ORAL_TABLET | Freq: Four times a day (QID) | ORAL | Status: AC | PRN

## 2010-08-24 NOTE — Addendum Note (Signed)
Addendum  created 08/24/10 0805 by Tyrone Apple. Marissa Salazar   Modules edited:Notes Section

## 2010-08-24 NOTE — Discharge Summary (Signed)
Obstetric Discharge Summary Reason for Admission: onset of labor Prenatal Procedures: ultrasound Intrapartum Procedures: spontaneous vaginal delivery Postpartum Procedures: none Complications-Operative and Postpartum: none  Hemoglobin  Date Value Range Status  08/23/2010 11.2* 12.0-15.0 (g/dL) Final     HCT  Date Value Range Status  08/23/2010 33.3* 36.0-46.0 (%) Final    Discharge Diagnoses: Term Pregnancy-delivered  Discharge Information: Date: 08/24/2010 Activity: unrestricted Diet:  Medications: Ibuprophen Condition: stable Instructions:  Discharge to: home   Newborn Data: Live born  Information for the patient's newborn:  Sahaana, Weitman [[id number]]  female ; APGAR , ; weight ;  Home with mother.  Mailyn Steichen E 08/24/2010, 8:30 AM

## 2010-08-24 NOTE — Progress Notes (Signed)
Experienced BF mom. States BF going well. Baby cluster fed through night. Mom reports breasts are feeling fuller this morning. No questions at present.

## 2010-08-24 NOTE — Anesthesia Postprocedure Evaluation (Signed)
  Post-op Vital Signs: Reviewed and stable  Complications: No apparent anesthesia complications

## 2010-08-26 ENCOUNTER — Other Ambulatory Visit (HOSPITAL_COMMUNITY): Payer: Self-pay

## 2010-09-14 ENCOUNTER — Encounter: Payer: Self-pay | Admitting: Family Medicine

## 2010-09-14 ENCOUNTER — Ambulatory Visit (INDEPENDENT_AMBULATORY_CARE_PROVIDER_SITE_OTHER)
Admission: RE | Admit: 2010-09-14 | Discharge: 2010-09-14 | Disposition: A | Discharge status: Home / Self Care | Payer: 59 | Source: Ambulatory Visit | Attending: Family Medicine | Admitting: Family Medicine

## 2010-09-14 ENCOUNTER — Ambulatory Visit (INDEPENDENT_AMBULATORY_CARE_PROVIDER_SITE_OTHER): Payer: 59 | Admitting: Family Medicine

## 2010-09-14 VITALS — BP 118/74 | HR 84 | Temp 98.2°F | Wt 161.0 lb

## 2010-09-14 DIAGNOSIS — M79671 Pain in right foot: Secondary | ICD-10-CM | POA: Insufficient documentation

## 2010-09-14 DIAGNOSIS — M79609 Pain in unspecified limb: Secondary | ICD-10-CM

## 2010-09-14 NOTE — Assessment & Plan Note (Addendum)
Anticipate bony contusion after metal rail vs foot.   xrays to r/o fracture of navicular and fibula - ok on my read. Supportive care for now, update if not improving as expected. Had kim wrap foot with ace bandage. currently breast feeding.

## 2010-09-14 NOTE — Progress Notes (Signed)
  Subjective:    Patient ID: Marissa Salazar, female    DOB: 1980-10-06, 30 y.o.   MRN: [id number]  HPI CC: foot injury  DOI: 09/14/2010 at ~1pm.  Shopping at [employer], metal rail-guard fell on right foot, hit lower leg and upper anterior ankle.  Able to walk afterwards but tender.  Walking with limp.  Some swelling.  Placed ice on it store.    Hasn't taken any tylenol or other med.  No right foot injury in past.  H/o L foot fracture at 5th toe.  currently breast feeding 3wk old son.  Review of Systems Per HPI    Objective:   Physical Exam  Nursing note and vitals reviewed. Constitutional: She appears well-developed and well-nourished. No distress.  Cardiovascular:  Pulses:      Dorsalis pedis pulses are 2+ on the right side, and 2+ on the left side.       Posterior tibial pulses are 2+ on the right side, and 2+ on the left side.  Pulmonary/Chest: Breath sounds normal.  Musculoskeletal: Normal range of motion. She exhibits no edema.       Right ankle: She exhibits normal range of motion and no swelling. Achilles tendon normal. Achilles tendon exhibits no pain.       Left ankle: Normal.       Right lower leg: She exhibits tenderness and swelling. She exhibits no bony tenderness and no edema.       Left lower leg: Normal.       Right foot: She exhibits tenderness, bony tenderness and swelling. She exhibits normal range of motion and normal capillary refill.       Left foot: Normal.       Feet:       R soft tissue swelling 2 in diameter lateral lower leg about 4in above lateral malleolus. R anterior foot with tenderness anterior navicular and cuneiforms Able to walk with limp.  No pain base of 5th or bilateral malleoli. Small abrasions R anterior foot  Neurological: No sensory deficit.          Assessment & Plan:

## 2010-09-14 NOTE — Patient Instructions (Signed)
xrays looking ok on my read. If any change based on radiology report, will call you with update. For now, treat with ice 3-4 times a day for 10 min at a time (wrapped in towel), elevate leg, tylenol or ibuprofen for inflammation/pain. May also wrap with ace bandage to provide extra support, compression. Update [place] if not improving as expected.

## 2011-02-08 HISTORY — PX: BUNIONECTOMY: SHX129

## 2011-07-18 ENCOUNTER — Encounter: Payer: Self-pay | Admitting: Family Medicine

## 2011-07-18 ENCOUNTER — Ambulatory Visit (INDEPENDENT_AMBULATORY_CARE_PROVIDER_SITE_OTHER): Payer: 59 | Admitting: Family Medicine

## 2011-07-18 VITALS — BP 108/72 | HR 64 | Temp 98.3°F | Wt 147.0 lb

## 2011-07-18 DIAGNOSIS — Z136 Encounter for screening for cardiovascular disorders: Secondary | ICD-10-CM

## 2011-07-18 DIAGNOSIS — Z0000 Encounter for general adult medical examination without abnormal findings: Secondary | ICD-10-CM | POA: Insufficient documentation

## 2011-07-18 DIAGNOSIS — N898 Other specified noninflammatory disorders of vagina: Secondary | ICD-10-CM | POA: Insufficient documentation

## 2011-07-18 LAB — POCT URINALYSIS DIPSTICK
Bilirubin, UA: NEGATIVE
Ketones, UA: NEGATIVE
Protein, UA: NEGATIVE
Spec Grav, UA: <=1.005

## 2011-07-18 MED ORDER — TERCONAZOLE 0.8 % VA CREA: 1.0000 | TOPICAL_CREAM | Freq: Every day | VAGINAL | Status: DC

## 2011-07-18 MED ORDER — NYSTATIN-TRIAMCINOLONE 100000-0.1 UNIT/GM-% EX OINT: TOPICAL_OINTMENT | Freq: Two times a day (BID) | CUTANEOUS | Status: DC

## 2011-07-18 NOTE — Patient Instructions (Signed)
Great to see you. We will call you with your lab results. Try mycolog on your bottom and terazol in your vaginal area. Please call me next week.

## 2011-07-18 NOTE — Progress Notes (Signed)
Subjective:    Patient ID: Marissa Salazar, female    DOB: 07/17/1980, 31 y.o.   MRN: [id number]  HPI  31 yo G2P2 here for CPX with complaint of vaginal itching.  Past two months, right after she finishes her period, having thick, white, itchy vaginal discharge. She is still breast feeding. Used topical monistat this month and last month, eventually resolved but very itchy. No dysuria.  Otherwise doing well without complaints.  She and her husband are doing well. Older son, Ree Kida, is adjusting to being a big brother.  Patient Active Problem List  Diagnoses  . PHARYNGITIS, STREPTOCOCCAL  . HEMORRHOIDS-EXTERNAL  . ALLERGIC RHINITIS  . APHTHOUS ULCERS  . GERD  . CONSTIPATION  . SHOULDER IMPINGEMENT SYNDROME, LEFT  . FATIGUE  . ABDOMINAL PAIN, EPIGASTRIC  . ABDOMINAL PAIN, GENERALIZED  . NECK SPRAIN AND STRAIN  . Normal labor and delivery  . Right foot pain  . Routine general medical examination at a health care facility  . Vaginal Discharge   Past Medical History  Diagnosis Date  . Abnormal Pap smear     in past; normal colpo  . Allergic rhinitis    Past Surgical History  Procedure Date  . Colposcopy 2009   History  Substance Use Topics  . Smoking status: Never Smoker   . Smokeless tobacco: Not on file  . Alcohol Use: Yes     Rare   Family History  Problem Relation Age of Onset  . Pancreatic cancer Maternal Aunt   . Colon cancer Maternal Grandmother 50  . Breast cancer      GP  . Lung cancer      GP   No Known Allergies Current Outpatient Prescriptions on File Prior to Visit  Medication Sig Dispense Refill  . prenatal vitamin w/FE, FA (PRENATAL 1 + 1) 27-1 MG TABS Take 1 tablet by mouth daily.       . vitamin B-12 (CYANOCOBALAMIN) 1000 MCG tablet Take 1,000 mcg by mouth daily.         The PMH, PSH, Social History, Family History, Medications, and allergies have been reviewed in [facility], and have been updated if relevant.    Review of Systems See  HPI Patient reports no  vision/ hearing changes,anorexia, weight change, fever ,adenopathy, persistant / recurrent hoarseness, swallowing issues, chest pain, edema,persistant / recurrent cough, hemoptysis, dyspnea(rest, exertional, paroxysmal nocturnal), gastrointestinal  bleeding (melena, rectal bleeding), abdominal pain, excessive heart burn, GU symptoms(dysuria, hematuria, pyuria, voiding/incontinence  Issues) syncope, focal weakness, severe memory loss, concerning skin lesions, depression, anxiety, abnormal bruising/bleeding, major joint swelling, breast masses or abnormal vaginal bleeding.       Objective:   Physical Exam BP 108/72  Pulse 64  Temp 98.3 F (36.8 C)  Wt 147 lb (66.679 kg)  Breastfeeding? Yes   General:  Well-developed,well-nourished,in no acute distress; alert,appropriate and cooperative throughout examination Head:  normocephalic and atraumatic.   Eyes:  vision grossly intact, pupils equal, pupils round, and pupils reactive to light.   Ears:  R ear normal and L ear normal.   Nose:  no external deformity.   Mouth:  good dentition.   Neck:  No deformities, masses, or tenderness noted. Breasts:  No mass, nodules, thickening, tenderness, bulging, retraction, inflamation, nipple discharge or skin changes noted.   Lungs:  Normal respiratory effort, chest expands symmetrically. Lungs are clear to auscultation, no crackles or wheezes. Heart:  Normal rate and regular rhythm. S1 and S2 normal without gallop, murmur, click, rub or other  extra sounds. Abdomen:  Bowel sounds positive,abdomen soft and non-tender without masses, organomegaly or hernias noted. Rectal: no external abnormalities Erythema with satellite lesions are gluteal folds.   Genitalia:  Pelvic Exam:        External: thick, white discharge in vault        Vagina: normal without lesions or masses            Pap smear: not due- not performed Msk:  No deformity or scoliosis noted of thoracic or lumbar spine.    Extremities:  No clubbing, cyanosis, edema, or deformity noted with normal full range of motion of all joints.   Neurologic:  alert & oriented X3 and gait normal.   Skin:  Intact without suspicious lesions or rashes Cervical Nodes:  No lymphadenopathy noted Axillary Nodes:  No palpable lymphadenopathy Psych:  Cognition and judgment appear intact. Alert and cooperative with normal attention span and concentration. No apparent delusions, illusions, hallucinations      Assessment & Plan:   1. Routine general medical examination at a health care facility  Reviewed preventive care protocols, scheduled due services, and updated immunizations Discussed nutrition, exercise, diet, and healthy lifestyle.  Comprehensive metabolic panel  2. Vaginal Discharge  New- wet prep positive for yeast. Discussed treatment options with Lurena Joiner- there is some absorption systemically even with topical antifungals. Since she is breast feeding, she would prefer to try topical first. Rx for terazol sent to pharmacy.  She will call me with an update. Hemoglobin A1c, POCT urinalysis dipstick, Urine culture  3. Screening for ischemic heart disease  Lipid Panel

## 2011-07-19 ENCOUNTER — Telehealth: Payer: Self-pay

## 2011-07-19 LAB — COMPREHENSIVE METABOLIC PANEL
Albumin: 4 g/dL (ref 3.5–5.2)
Alkaline Phosphatase: 54 U/L (ref 39–117)
BUN: 18 mg/dL (ref 6–23)
CO2: 27 mEq/L (ref 19–32)
GFR: 106.96 mL/min (ref >60.00)
Glucose, Bld: 73 mg/dL (ref 70–99)
Total Bilirubin: 0.3 mg/dL (ref 0.3–1.2)

## 2011-07-19 LAB — LIPID PANEL
Cholesterol: 144 mg/dL (ref 0–200)
LDL Cholesterol: 59 mg/dL (ref 0–99)
Triglycerides: 50 mg/dL (ref 0.0–149.0)

## 2011-07-19 NOTE — Telephone Encounter (Signed)
Ok to send

## 2011-07-19 NOTE — Telephone Encounter (Signed)
She can get triamcinolone and nystatin as separate creams for $ 4.00 a piece, per pharmacy.

## 2011-07-19 NOTE — Telephone Encounter (Signed)
Mycolog combination drug was over $100; pt could not afford. [facility] told pt plain Mycolog would cost $4. Pt request plain Mycolog to [facility].Please advise.

## 2011-07-19 NOTE — Telephone Encounter (Signed)
Pharmacist will fill 15 grams of each, with same directions as mucolog.

## 2011-07-19 NOTE — Telephone Encounter (Signed)
Does she mean nystatin?  Mycolog is a combo.  Please call pharmacy to verify.

## 2011-07-20 ENCOUNTER — Other Ambulatory Visit: Payer: Self-pay | Admitting: Family Medicine

## 2011-07-20 LAB — URINE CULTURE: Colony Count: 25000

## 2011-07-20 NOTE — Telephone Encounter (Signed)
Ok to refill 

## 2011-09-07 ENCOUNTER — Ambulatory Visit (INDEPENDENT_AMBULATORY_CARE_PROVIDER_SITE_OTHER): Payer: 59 | Admitting: Family Medicine

## 2011-09-07 ENCOUNTER — Encounter: Payer: Self-pay | Admitting: Family Medicine

## 2011-09-07 VITALS — BP 100/70 | HR 68 | Temp 98.0°F | Wt 146.0 lb

## 2011-09-07 DIAGNOSIS — J029 Acute pharyngitis, unspecified: Secondary | ICD-10-CM

## 2011-09-07 LAB — POCT RAPID STREP A (OFFICE): Rapid Strep A Screen: POSITIVE — AB

## 2011-09-07 MED ORDER — AMOXICILLIN 500 MG PO CAPS: 500.0000 mg | ORAL_CAPSULE | Freq: Two times a day (BID) | ORAL | Status: AC

## 2011-09-07 NOTE — Patient Instructions (Addendum)

## 2011-09-07 NOTE — Progress Notes (Signed)
SUBJECTIVE: 31 y.o. female with sore throat, myalgias, swollen glands, headache and fever for 1 day. No history of rheumatic fever. Other symptoms: myalgias.  She is currently breast feeding. Denies any breast pain or cracking. Patient Active Problem List  Diagnosis  . PHARYNGITIS, STREPTOCOCCAL  . HEMORRHOIDS-EXTERNAL  . ALLERGIC RHINITIS  . APHTHOUS ULCERS  . GERD  . CONSTIPATION  . SHOULDER IMPINGEMENT SYNDROME, LEFT  . FATIGUE  . ABDOMINAL PAIN, EPIGASTRIC  . ABDOMINAL PAIN, GENERALIZED  . NECK SPRAIN AND STRAIN  . Normal labor and delivery  . Right foot pain  . Routine general medical examination at a health care facility  . Vaginal Discharge   Past Medical History  Diagnosis Date  . Abnormal Pap smear     in past; normal colpo  . Allergic rhinitis    Past Surgical History  Procedure Date  . Colposcopy 2009   History  Substance Use Topics  . Smoking status: Never Smoker   . Smokeless tobacco: Not on file  . Alcohol Use: Yes     Rare   Family History  Problem Relation Age of Onset  . Pancreatic cancer Maternal Aunt   . Colon cancer Maternal Grandmother 50  . Breast cancer      GP  . Lung cancer      GP   No Known Allergies No current outpatient prescriptions on file prior to visit.   The PMH, PSH, Social History, Family History, Medications, and allergies have been reviewed in [facility], and have been updated if relevant.  OBJECTIVE:  BP 100/70  Pulse 68  Temp 98 F (36.7 C)  Wt 146 lb (66.225 kg)  Breastfeeding? Yes  Appears alert, well appearing, and in no distress. Ears: bilateral TM's and external ear canals normal Oropharynx: erythematous Neck: supple, shotty cervical lymphadenopathy Lungs: clear to auscultation, no wheezes, rales or rhonchi, symmetric air entry Rapid Strep test is positive  ASSESSMENT: Streptococcal pharyngitis  PLAN: Per orders. Amoxicillin 500 mg- 1 tab twice daily x 10 days.  Discussed excretion into breast milk but  preferred over PCN in terms of amount excreted into breast milk.  Pt is aware.Gargle, use acetaminophen or other OTC analgesic, and take Rx fully as prescribed. Call if other family members develop similar symptoms. See prn.

## 2011-09-08 ENCOUNTER — Ambulatory Visit: Payer: 59 | Admitting: Family Medicine

## 2011-11-17 ENCOUNTER — Encounter: Payer: Self-pay | Admitting: Family Medicine

## 2011-11-17 ENCOUNTER — Ambulatory Visit (INDEPENDENT_AMBULATORY_CARE_PROVIDER_SITE_OTHER): Payer: 59 | Admitting: Family Medicine

## 2011-11-17 VITALS — BP 110/70 | HR 68 | Temp 97.7°F | Wt 143.0 lb

## 2011-11-17 DIAGNOSIS — M542 Cervicalgia: Secondary | ICD-10-CM

## 2011-11-17 MED ORDER — MELOXICAM 7.5 MG PO TABS: 7.5000 mg | ORAL_TABLET | Freq: Every day | ORAL | Status: DC

## 2011-11-17 MED ORDER — CYCLOBENZAPRINE HCL 5 MG PO TABS: 5.0000 mg | ORAL_TABLET | Freq: Three times a day (TID) | ORAL | Status: DC | PRN

## 2011-11-17 NOTE — Progress Notes (Signed)
SUBJECTIVE:  Marissa Salazar is a 31 y.o. female who complains of neck pain 1 week(s) ago. The pain is positional with movement of neck without radiation of pain down the arms. Mechanism of injury: sleeping in odd position- weaning baby off of night time breast feeding.  Symptoms have been intermittent since that time. Prior history of neck problems: no prior neck problems. There is no numbness, tingling, weakness in the arms. Patient Active Problem List  Diagnosis  . PHARYNGITIS, STREPTOCOCCAL  . HEMORRHOIDS-EXTERNAL  . ALLERGIC RHINITIS  . APHTHOUS ULCERS  . GERD  . CONSTIPATION  . SHOULDER IMPINGEMENT SYNDROME, LEFT  . FATIGUE  . ABDOMINAL PAIN, EPIGASTRIC  . ABDOMINAL PAIN, GENERALIZED  . NECK SPRAIN AND STRAIN  . Normal labor and delivery  . Right foot pain  . Routine general medical examination at a health care facility  . Vaginal Discharge   Past Medical History  Diagnosis Date  . Abnormal Pap smear     in past; normal colpo  . Allergic rhinitis    Past Surgical History  Procedure Date  . Colposcopy 2009   History  Substance Use Topics  . Smoking status: Never Smoker   . Smokeless tobacco: Not on file  . Alcohol Use: Yes     Rare   Family History  Problem Relation Age of Onset  . Pancreatic cancer Maternal Aunt   . Colon cancer Maternal Grandmother 50  . Breast cancer      GP  . Lung cancer      GP   No Known Allergies Current Outpatient Prescriptions on File Prior to Visit  Medication Sig Dispense Refill  . PRENATAL VITAMINS PO Take by mouth.       The PMH, PSH, Social History, Family History, Medications, and allergies have been reviewed in [facility], and have been updated if relevant.  OBJECTIVE: BP 110/70  Pulse 68  Temp 97.7 F (36.5 C)  Wt 143 lb (64.864 kg)  Breastfeeding? Yes  Vital signs as noted above. Patient appears to be in mild to moderate pain.  Neck exam: normal C-spine, no tenderness, full ROM without pain, tenderness over trapezial  muscles. X-Ray: not indicated.  ASSESSMENT:  Trapezius spasm  PLAN: rest the injured area as much as practical, apply heat (warned not to sleep on heating pad), prescription for analgesic given Consider Physical Therapy and XRay studies if not improving.  Call or return to clinic prn if these symptoms worsen or fail to improve as anticipated.

## 2011-11-17 NOTE — Patient Instructions (Addendum)
Great to see you. You have a trapezius spasm. Continue heat, massage (doctor's orders), meloxicam and flexeril. Call me next week if no better.

## 2011-11-18 ENCOUNTER — Ambulatory Visit: Payer: 59 | Admitting: Family Medicine

## 2011-11-25 LAB — HM PAP SMEAR

## 2012-01-13 ENCOUNTER — Telehealth: Payer: Self-pay | Admitting: Family Medicine

## 2012-01-13 MED ORDER — CYCLOBENZAPRINE HCL 10 MG PO TABS: 10.0000 mg | ORAL_TABLET | Freq: Three times a day (TID) | ORAL | Status: DC | PRN

## 2012-01-13 NOTE — Telephone Encounter (Signed)
Higher dose of flexeril sent to her pharmacy.

## 2012-01-13 NOTE — Telephone Encounter (Signed)
Advised patient

## 2012-01-13 NOTE — Telephone Encounter (Signed)
Pt called and stated that Dr Dayton Martes had prescribed flexeril to her for neck pain but it is no longer effective in treating her pain.  [facility] uses [facility] on [address]  Please advise.

## 2012-01-19 ENCOUNTER — Ambulatory Visit (INDEPENDENT_AMBULATORY_CARE_PROVIDER_SITE_OTHER)
Admission: RE | Admit: 2012-01-19 | Discharge: 2012-01-19 | Disposition: A | Discharge status: Home / Self Care | Payer: 59 | Source: Ambulatory Visit | Attending: Family Medicine | Admitting: Family Medicine

## 2012-01-19 ENCOUNTER — Ambulatory Visit (INDEPENDENT_AMBULATORY_CARE_PROVIDER_SITE_OTHER): Payer: 59 | Admitting: Family Medicine

## 2012-01-19 ENCOUNTER — Encounter: Payer: Self-pay | Admitting: Family Medicine

## 2012-01-19 ENCOUNTER — Ambulatory Visit: Payer: 59 | Admitting: Family Medicine

## 2012-01-19 ENCOUNTER — Other Ambulatory Visit: Payer: Self-pay | Admitting: Family Medicine

## 2012-01-19 VITALS — BP 110/80 | HR 76 | Temp 97.9°F | Wt 140.0 lb

## 2012-01-19 DIAGNOSIS — M62838 Other muscle spasm: Secondary | ICD-10-CM

## 2012-01-19 DIAGNOSIS — M542 Cervicalgia: Secondary | ICD-10-CM

## 2012-01-19 NOTE — Patient Instructions (Addendum)
Let's get the xray today. If it's normal, I will send you to physical therapy.  Continue flexeril and Mobic.

## 2012-01-19 NOTE — Progress Notes (Signed)
SUBJECTIVE:  Marissa Salazar is a 31 y.o. female who complains of persistent neck pain.  Saw her two months ago for right neck pain of 1 week duration without radiation or numbness of UE.    Mechanism of injury: sleeping in odd position- weaning baby off of night time breast feeding.  Symptoms have been intermittent since that time. Last week had a little tingling in right upper arm, that resolved.  No RUE weakness.  Given mobic and flexeril which "takes the edge off."  Here because pain has persisted.  Patient Active Problem List  Diagnosis  . PHARYNGITIS, STREPTOCOCCAL  . HEMORRHOIDS-EXTERNAL  . ALLERGIC RHINITIS  . APHTHOUS ULCERS  . GERD  . CONSTIPATION  . SHOULDER IMPINGEMENT SYNDROME, LEFT  . FATIGUE  . ABDOMINAL PAIN, EPIGASTRIC  . ABDOMINAL PAIN, GENERALIZED  . NECK SPRAIN AND STRAIN  . Normal labor and delivery  . Right foot pain  . Routine general medical examination at a health care facility  . Vaginal Discharge   Past Medical History  Diagnosis Date  . Abnormal Pap smear     in past; normal colpo  . Allergic rhinitis    Past Surgical History  Procedure Date  . Colposcopy 2009   History  Substance Use Topics  . Smoking status: Never Smoker   . Smokeless tobacco: Not on file  . Alcohol Use: Yes     Comment: Rare   Family History  Problem Relation Age of Onset  . Pancreatic cancer Maternal Aunt   . Colon cancer Maternal Grandmother 50  . Breast cancer      GP  . Lung cancer      GP   No Known Allergies Current Outpatient Prescriptions on File Prior to Visit  Medication Sig Dispense Refill  . cyclobenzaprine (FLEXERIL) 10 MG tablet Take 1 tablet (10 mg total) by mouth 3 (three) times daily as needed for muscle spasms.  30 tablet  0  . meloxicam (MOBIC) 7.5 MG tablet Take 1 tablet (7.5 mg total) by mouth daily.  30 tablet  0  . PRENATAL VITAMINS PO Take by mouth.       The PMH, PSH, Social History, Family History, Medications, and allergies have been  reviewed in [facility], and have been updated if relevant.  OBJECTIVE: BP 110/80  Pulse 76  Temp 97.9 F (36.6 C)  Wt 140 lb (63.504 kg)  Breastfeeding? No  Vital signs as noted above. NAD Neck exam: normal C-spine, no tenderness, full ROM without pain, tenderness over trapezial muscles. FROM of right shoulder- neg arch, neg empty can X-Ray: ordered  ASSESSMENT:  Trapezius spasm vs cervical impingement  PLAN: Given duration of symptoms, xray today. rest the injured area as much as practical, apply heat (warned not to sleep on heating pad), prescription for analgesic given Consider Physical Therapy and further imaging. Call or return to clinic prn if these symptoms worsen or fail to improve as anticipated.

## 2012-01-30 ENCOUNTER — Other Ambulatory Visit (HOSPITAL_COMMUNITY): Payer: Self-pay | Admitting: Orthopedic Surgery

## 2012-01-30 DIAGNOSIS — R29898 Other symptoms and signs involving the musculoskeletal system: Secondary | ICD-10-CM

## 2012-01-30 DIAGNOSIS — M542 Cervicalgia: Secondary | ICD-10-CM

## 2012-02-07 ENCOUNTER — Ambulatory Visit (HOSPITAL_COMMUNITY)
Admission: RE | Admit: 2012-02-07 | Discharge: 2012-02-07 | Disposition: A | Discharge status: Home / Self Care | Payer: 59 | Source: Ambulatory Visit | Attending: Orthopedic Surgery | Admitting: Orthopedic Surgery

## 2012-02-07 DIAGNOSIS — R29898 Other symptoms and signs involving the musculoskeletal system: Secondary | ICD-10-CM | POA: Insufficient documentation

## 2012-02-07 DIAGNOSIS — M542 Cervicalgia: Secondary | ICD-10-CM | POA: Insufficient documentation

## 2012-05-03 ENCOUNTER — Ambulatory Visit (INDEPENDENT_AMBULATORY_CARE_PROVIDER_SITE_OTHER): Payer: 59 | Admitting: Family Medicine

## 2012-05-03 VITALS — BP 100/70 | HR 80 | Temp 97.9°F | Wt 143.0 lb

## 2012-05-03 DIAGNOSIS — N390 Urinary tract infection, site not specified: Secondary | ICD-10-CM

## 2012-05-03 LAB — POCT URINALYSIS DIPSTICK
Bilirubin, UA: NEGATIVE
Nitrite, UA: NEGATIVE
Urobilinogen, UA: NEGATIVE
pH, UA: 6.5

## 2012-05-03 MED ORDER — CIPROFLOXACIN HCL 500 MG PO TABS: 500.0000 mg | ORAL_TABLET | Freq: Two times a day (BID) | ORAL | Status: DC

## 2012-05-03 NOTE — Progress Notes (Signed)
SUBJECTIVE: Marissa Salazar is a 32 y.o. female who complains of urinary frequency, urgency and dysuria x 2 days, without  fever, chills, or abnormal vaginal discharge or bleeding.  She did have some right flank pain this morning.  Patient Active Problem List  Diagnosis  . HEMORRHOIDS-EXTERNAL  . ALLERGIC RHINITIS  . APHTHOUS ULCERS  . GERD  . CONSTIPATION  . FATIGUE  . NECK SPRAIN AND STRAIN  . Normal labor and delivery  . Right foot pain   Past Medical History  Diagnosis Date  . Abnormal Pap smear     in past; normal colpo  . Allergic rhinitis    Past Surgical History  Procedure Laterality Date  . Colposcopy  2009   History  Substance Use Topics  . Smoking status: Never Smoker   . Smokeless tobacco: Not on file  . Alcohol Use: Yes     Comment: Rare   Family History  Problem Relation Age of Onset  . Pancreatic cancer Maternal Aunt   . Colon cancer Maternal Grandmother 50  . Breast cancer      GP  . Lung cancer      GP   No Known Allergies Current Outpatient Prescriptions on File Prior to Visit  Medication Sig Dispense Refill  . cyclobenzaprine (FLEXERIL) 10 MG tablet Take 1 tablet (10 mg total) by mouth 3 (three) times daily as needed for muscle spasms.  30 tablet  0  . meloxicam (MOBIC) 7.5 MG tablet Take 1 tablet (7.5 mg total) by mouth daily.  30 tablet  0  . PRENATAL VITAMINS PO Take by mouth.       No current facility-administered medications on file prior to visit.   The PMH, PSH, Social History, Family History, Medications, and allergies have been reviewed in [facility], and have been updated if relevant.  OBJECTIVE:  BP 100/70  Pulse 80  Temp(Src) 97.9 F (36.6 C)  Wt 143 lb (64.864 kg)  BMI 24.53 kg/m2 Appears well, in no apparent distress.  Vital signs are normal. The abdomen is soft without tenderness, guarding, mass, rebound or organomegaly. Mild CVA tenderness right, no  inguinal adenopathy noted. Urine dipstick shows positive for RBC's and positive for  leukocytes.    ASSESSMENT: UTI complicated  PLAN: Treatment per orders - cipro 500 mg twice daily x 7 days, also push fluids, may use Pyridium OTC prn. Call or return to clinic prn if these symptoms worsen or fail to improve as anticipated.

## 2012-05-03 NOTE — Patient Instructions (Signed)
Good to see you. Please take cipro as directed- 1 tablet twice daily for 7 days. Drink plenty of fluids.  We will call you with your urine culture results.

## 2012-05-03 NOTE — Addendum Note (Signed)
Addended by: Eliezer Bottom on: 05/03/2012 09:36 AM   Modules accepted: Orders

## 2012-05-05 LAB — URINE CULTURE

## 2012-12-26 LAB — HM PAP SMEAR: HM Pap smear: NEGATIVE

## 2013-01-30 ENCOUNTER — Encounter: Payer: Self-pay | Admitting: Family Medicine

## 2013-01-30 ENCOUNTER — Ambulatory Visit (INDEPENDENT_AMBULATORY_CARE_PROVIDER_SITE_OTHER): Payer: 59 | Admitting: Family Medicine

## 2013-01-30 VITALS — BP 108/64 | HR 97 | Temp 97.7°F | Ht 64.0 in | Wt 133.2 lb

## 2013-01-30 DIAGNOSIS — R079 Chest pain, unspecified: Secondary | ICD-10-CM

## 2013-01-30 DIAGNOSIS — M546 Pain in thoracic spine: Secondary | ICD-10-CM | POA: Insufficient documentation

## 2013-01-30 DIAGNOSIS — R0789 Other chest pain: Secondary | ICD-10-CM | POA: Insufficient documentation

## 2013-01-30 DIAGNOSIS — R0781 Pleurodynia: Secondary | ICD-10-CM | POA: Insufficient documentation

## 2013-01-30 NOTE — Patient Instructions (Signed)
I think you have some back strain - and you may be out of alignment as well  Stretch  Use heat on affected area  Try to carry things on the right instead of the left side  Flexeril is ok at night  Try aleve 1-2 pills with food up to twice daily  Update if not starting to improve in a week or if worsening

## 2013-01-30 NOTE — Progress Notes (Signed)
Pre-visit discussion using our clinic review tool. No additional management support is needed unless otherwise documented below in the visit note.  

## 2013-01-30 NOTE — Progress Notes (Signed)
Subjective:    Patient ID: Marissa Salazar, female    DOB: 06/19/1980, 32 y.o.   MRN: [id number]  HPI 1-2 weeks ago - began having pain in L axilla -she could not feel any lumps  Pain comes and goes Also L ribs laterally are sore and seem to be sticking out more   Non smoker  Had a cold in Nov- better  Not a lot of coughing    No trauma   No hx of mammograms  MGM had breast and colon cancer   Carries a 30 year old sometimes - moreso on the L side Also carries wood on the L side   Patient Active Problem List   Diagnosis Date Noted  . Right foot pain 09/14/2010  . Normal labor and delivery 08/22/2010  . NECK SPRAIN AND STRAIN 06/04/2009  . APHTHOUS ULCERS 02/02/2009  . FATIGUE 11/28/2008  . HEMORRHOIDS-EXTERNAL 06/18/2008  . CONSTIPATION 06/18/2008  . ALLERGIC RHINITIS 05/14/2008  . GERD 05/14/2008   Past Medical History  Diagnosis Date  . Abnormal Pap smear     in past; normal colpo  . Allergic rhinitis    Past Surgical History  Procedure Laterality Date  . Colposcopy  2009   History  Substance Use Topics  . Smoking status: Never Smoker   . Smokeless tobacco: Not on file  . Alcohol Use: Yes     Comment: Rare   Family History  Problem Relation Age of Onset  . Pancreatic cancer Maternal Aunt   . Colon cancer Maternal Grandmother 50  . Breast cancer      GP  . Lung cancer      GP   No Known Allergies Current Outpatient Prescriptions on File Prior to Visit  Medication Sig Dispense Refill  . cyclobenzaprine (FLEXERIL) 10 MG tablet Take 1 tablet (10 mg total) by mouth 3 (three) times daily as needed for muscle spasms.  30 tablet  0  . PRENATAL VITAMINS PO Take by mouth.       No current facility-administered medications on file prior to visit.    Review of Systems Review of Systems  Constitutional: Negative for fever, appetite change, fatigue and unexpected weight change.  Eyes: Negative for pain and visual disturbance.  Respiratory: Negative for cough  and shortness of breath.  pos for L chest wall pain  Cardiovascular: Negative for palpitations   Or cp on exertion Gastrointestinal: Negative for nausea, diarrhea and constipation.  Genitourinary: Negative for urgency and frequency.  Skin: Negative for pallor or rash   Neurological: Negative for weakness, light-headedness, numbness and headaches.  Hematological: Negative for adenopathy. Does not bruise/bleed easily.  Psychiatric/Behavioral: Negative for dysphoric mood. The patient is not nervous/anxious.         Objective:   Physical Exam  Constitutional: She appears well-developed and well-nourished. No distress.  HENT:  Head: Normocephalic and atraumatic.  Mouth/Throat: Oropharynx is clear and moist.  Eyes: Conjunctivae and EOM are normal. Pupils are equal, round, and reactive to light. No scleral icterus.  Neck: Normal range of motion. Neck supple. No tracheal deviation present. No thyromegaly present.  Cardiovascular: Normal rate, regular rhythm and normal heart sounds.  Exam reveals no gallop.   Pulmonary/Chest: Effort normal and breath sounds normal. No respiratory distress. She has no wheezes. She has no rales. She exhibits tenderness.  Tender over L anterolat ribs No skin change/ecchymosis or crepitus   Nl rom neck and UEs   Musculoskeletal: She exhibits tenderness.  Mild tenderness lat to  thoracic spine in L sided musculature   Lymphadenopathy:    She has no cervical adenopathy.  Neurological: She is alert. She has normal reflexes. She exhibits normal muscle tone.  Skin: Skin is warm and dry. No rash noted.  Psychiatric: She has a normal mood and affect.          Assessment & Plan:

## 2013-01-31 NOTE — Assessment & Plan Note (Signed)
Suspect due to ergonomics of carrying child and heavy objects on this side- disc changing this  ? If TS could be out of alignment  Will tx with nsaid otc Warm compresses and stretching  Update if not starting to improve in a week or if worsening

## 2013-01-31 NOTE — Assessment & Plan Note (Signed)
This may add to rib discomfort Disc nsaid/ warm compress /stretching  Update if not starting to improve in a week or if worsening

## 2013-02-06 ENCOUNTER — Ambulatory Visit (INDEPENDENT_AMBULATORY_CARE_PROVIDER_SITE_OTHER)
Admission: RE | Admit: 2013-02-06 | Discharge: 2013-02-06 | Disposition: A | Discharge status: Home / Self Care | Payer: 59 | Source: Ambulatory Visit | Attending: Family Medicine | Admitting: Family Medicine

## 2013-02-06 ENCOUNTER — Encounter: Payer: Self-pay | Admitting: Family Medicine

## 2013-02-06 ENCOUNTER — Ambulatory Visit (INDEPENDENT_AMBULATORY_CARE_PROVIDER_SITE_OTHER): Payer: 59 | Admitting: Family Medicine

## 2013-02-06 VITALS — BP 102/60 | HR 79 | Temp 97.3°F | Ht 64.0 in | Wt 136.5 lb

## 2013-02-06 DIAGNOSIS — R0781 Pleurodynia: Secondary | ICD-10-CM

## 2013-02-06 DIAGNOSIS — R079 Chest pain, unspecified: Secondary | ICD-10-CM

## 2013-02-06 DIAGNOSIS — M546 Pain in thoracic spine: Secondary | ICD-10-CM

## 2013-02-06 NOTE — Progress Notes (Signed)
   Subjective:    Patient ID: Marissa Salazar, female    DOB: July 16, 1980, 32 y.o.   MRN: [id number]  HPI  Pleasant 32 yo female here for follow up rib pain.  Saw Dr. Milinda Antis on 12/24 for this complaint.  Note reviewed.    Was having 1-2 weeks of pain in left axilla and thoracic back that would come and go.  Sharp, can "take breath away."  Can radiate to sternum.  Dr. Milinda Antis felt this may be due to how she was holding heavy objects and 30 year old whom she usually holds on her left side.  Advised NSAIDs and supportive care.  NSAIDs do take the edge off but symptoms have still persist.  No trauma   No hx of mammograms  MGM had breast and colon cancer    Patient Active Problem List   Diagnosis Date Noted  . Rib pain on left side 01/30/2013  . Thoracic back pain 01/30/2013  . GERD 05/14/2008   Past Medical History  Diagnosis Date  . Abnormal Pap smear     in past; normal colpo  . Allergic rhinitis    Past Surgical History  Procedure Laterality Date  . Colposcopy  2009   History  Substance Use Topics  . Smoking status: Never Smoker   . Smokeless tobacco: Not on file  . Alcohol Use: Yes     Comment: Rare   Family History  Problem Relation Age of Onset  . Pancreatic cancer Maternal Aunt   . Colon cancer Maternal Grandmother 50  . Breast cancer      GP  . Lung cancer      GP   No Known Allergies Current Outpatient Prescriptions on File Prior to Visit  Medication Sig Dispense Refill  . cyclobenzaprine (FLEXERIL) 10 MG tablet Take 1 tablet (10 mg total) by mouth 3 (three) times daily as needed for muscle spasms.  30 tablet  0  . PRENATAL VITAMINS PO Take by mouth.       No current facility-administered medications on file prior to visit.    Review of Systems See HPI No fevers, chills No nausea or vomiting No changes in appetite         Objective:   Physical Exam  BP 102/60  Pulse 79  Temp(Src) 97.3 F (36.3 C) (Oral)  Ht 5\' 4"  (1.626 m)  Wt 136 lb 8 oz (61.916  kg)  BMI 23.42 kg/m2  SpO2 98%  LMP 01/27/2013  Constitutional: She appears well-developed and well-nourished. No distress.  HENT:  Breast:  No masses or skin changes bilaterally Head: Normocephalic and atraumatic.  Mouth/Throat: Oropharynx is clear and moist.  Eyes: Conjunctivae and EOM are normal. Pupils are equal, round, and reactive to light. No scleral icterus.  Neck: Normal range of motion. Neck supple. No tracheal deviation present. No thyromegaly present.  Cardiovascular: Normal rate, regular rhythm and normal heart sounds.  Exam reveals no gallop.   Pulmonary/Chest: Effort normal and breath sounds normal. No respiratory distress. She has no wheezes. She has no rales. She exhibits tenderness.  No skin change/ecchymosis  Musculoskeletal: She exhibits tenderness.  Lymphadenopathy:    She has no cervical adenopathy.  Neurological: She is alert. She has normal reflexes. She exhibits normal muscle tone.  Skin: Skin is warm and dry. No rash noted.  Psychiatric: She has a normal mood and affect.      Assessment & Plan:

## 2013-02-06 NOTE — Patient Instructions (Signed)
Good to see you. Let's start out with an xray.  I will call you with those results.  Happy New Year.  Costochondritis Costochondritis (Tietze syndrome), or costochondral separation, is a swelling and irritation (inflammation) of the tissue (cartilage) that connects your ribs with your breastbone (sternum). It may occur on its own (spontaneously), through damage caused by an accident (trauma), or simply from coughing or minor exercise. It may take up to 6 weeks to get better and longer if you are unable to be conservative in your activities. HOME CARE INSTRUCTIONS   Avoid exhausting physical activity. Try not to strain your ribs during normal activity. This would include any activities using chest, belly (abdominal), and side muscles, especially if heavy weights are used.  Use ice for 15-20 minutes per hour while awake for the first 2 days. Place the ice in a plastic bag, and place a towel between the bag of ice and your skin.  Only take over-the-counter or prescription medicines for pain, discomfort, or fever as directed by your caregiver. SEEK IMMEDIATE MEDICAL CARE IF:   Your pain increases or you are very uncomfortable.  You have a fever.  You develop difficulty with your breathing.  You cough up blood.  You develop worse chest pains, shortness of breath, sweating, or vomiting.  You develop new, unexplained problems (symptoms). MAKE SURE YOU:   Understand these instructions.  Will watch your condition.  Will get help right away if you are not doing well or get worse. Document Released: 11/03/2004 Document Revised: 04/18/2011 Document Reviewed: 08/28/2012 [facility] Patient Information 2014 [place], [place].

## 2013-02-06 NOTE — Progress Notes (Signed)
Pre-visit discussion using our clinic review tool. No additional management support is needed unless otherwise documented below in the visit note.  

## 2013-02-06 NOTE — Assessment & Plan Note (Signed)
?  costochondritis but she pt is very concerned there is something more occuring. Will check cxr today.  Discussed costochondritis and given handout for supportive care- could last for several more weeks. If symptoms persist and CXR unremarkable, consider further imagine. The patient indicates understanding of these issues and agrees with the plan.

## 2013-02-06 NOTE — Assessment & Plan Note (Signed)
Likely MSK. See below.

## 2013-02-11 ENCOUNTER — Encounter: Payer: Self-pay | Admitting: Family Medicine

## 2013-02-12 ENCOUNTER — Other Ambulatory Visit: Payer: 59

## 2013-02-12 ENCOUNTER — Other Ambulatory Visit: Payer: Self-pay | Admitting: Family Medicine

## 2013-02-12 ENCOUNTER — Other Ambulatory Visit (INDEPENDENT_AMBULATORY_CARE_PROVIDER_SITE_OTHER): Payer: 59

## 2013-02-12 ENCOUNTER — Ambulatory Visit (INDEPENDENT_AMBULATORY_CARE_PROVIDER_SITE_OTHER)
Admission: RE | Admit: 2013-02-12 | Discharge: 2013-02-12 | Disposition: A | Discharge status: Home / Self Care | Payer: 59 | Source: Ambulatory Visit | Attending: Family Medicine | Admitting: Family Medicine

## 2013-02-12 ENCOUNTER — Ambulatory Visit: Payer: 59

## 2013-02-12 DIAGNOSIS — R0781 Pleurodynia: Secondary | ICD-10-CM

## 2013-02-12 DIAGNOSIS — R109 Unspecified abdominal pain: Secondary | ICD-10-CM

## 2013-02-12 DIAGNOSIS — M546 Pain in thoracic spine: Secondary | ICD-10-CM

## 2013-02-12 DIAGNOSIS — R079 Chest pain, unspecified: Secondary | ICD-10-CM

## 2013-02-12 LAB — CBC WITH DIFFERENTIAL/PLATELET
BASOS PCT: 0.3 % (ref 0.0–3.0)
Basophils Absolute: 0 10*3/uL (ref 0.0–0.1)
EOS ABS: 0.2 10*3/uL (ref 0.0–0.7)
Eosinophils Relative: 2.7 % (ref 0.0–5.0)
HCT: 36.4 % (ref 36.0–46.0)
Hemoglobin: 12.3 g/dL (ref 12.0–15.0)
Lymphocytes Relative: 26.7 % (ref 12.0–46.0)
Lymphs Abs: 2.2 10*3/uL (ref 0.7–4.0)
MCHC: 33.7 g/dL (ref 30.0–36.0)
MCV: 85.9 fl (ref 78.0–100.0)
MONO ABS: 0.4 10*3/uL (ref 0.1–1.0)
Monocytes Relative: 5.5 % (ref 3.0–12.0)
NEUTROS PCT: 64.8 % (ref 43.0–77.0)
Neutro Abs: 5.3 10*3/uL (ref 1.4–7.7)
PLATELETS: 243 10*3/uL (ref 150.0–400.0)
RBC: 4.24 Mil/uL (ref 3.87–5.11)
RDW: 14.2 % (ref 11.5–14.6)
WBC: 8.1 10*3/uL (ref 4.5–10.5)

## 2013-02-12 LAB — COMPREHENSIVE METABOLIC PANEL
ALBUMIN: 4.1 g/dL (ref 3.5–5.2)
ALK PHOS: 31 U/L — AB (ref 39–117)
ALT: 18 U/L (ref 0–35)
AST: 23 U/L (ref 0–37)
BUN: 13 mg/dL (ref 6–23)
CO2: 29 mEq/L (ref 19–32)
Calcium: 9.1 mg/dL (ref 8.4–10.5)
Chloride: 102 mEq/L (ref 96–112)
Creatinine, Ser: 0.7 mg/dL (ref 0.4–1.2)
GFR: 111.39 mL/min (ref >60.00)
Glucose, Bld: 89 mg/dL (ref 70–99)
POTASSIUM: 3.9 meq/L (ref 3.5–5.1)
SODIUM: 137 meq/L (ref 135–145)
TOTAL PROTEIN: 7 g/dL (ref 6.0–8.3)
Total Bilirubin: 0.8 mg/dL (ref 0.3–1.2)

## 2013-02-13 ENCOUNTER — Encounter: Payer: Self-pay | Admitting: Family Medicine

## 2013-02-14 ENCOUNTER — Other Ambulatory Visit: Payer: Self-pay | Admitting: Family Medicine

## 2013-02-14 MED ORDER — CYCLOBENZAPRINE HCL 10 MG PO TABS: 10.0000 mg | ORAL_TABLET | Freq: Three times a day (TID) | ORAL | Status: DC | PRN

## 2013-10-02 ENCOUNTER — Encounter: Payer: Self-pay | Admitting: Internal Medicine

## 2013-10-02 ENCOUNTER — Ambulatory Visit (INDEPENDENT_AMBULATORY_CARE_PROVIDER_SITE_OTHER): Payer: 59 | Admitting: Internal Medicine

## 2013-10-02 VITALS — BP 108/70 | HR 58 | Temp 97.7°F | Wt 135.0 lb

## 2013-10-02 DIAGNOSIS — L247 Irritant contact dermatitis due to plants, except food: Secondary | ICD-10-CM | POA: Insufficient documentation

## 2013-10-02 DIAGNOSIS — L255 Unspecified contact dermatitis due to plants, except food: Secondary | ICD-10-CM

## 2013-10-02 MED ORDER — TRIAMCINOLONE ACETONIDE 0.1 % EX CREA: 1.0000 "application " | TOPICAL_CREAM | Freq: Two times a day (BID) | CUTANEOUS | Status: DC | PRN

## 2013-10-02 MED ORDER — PREDNISONE 20 MG PO TABS: 40.0000 mg | ORAL_TABLET | Freq: Every day | ORAL | Status: DC

## 2013-10-02 NOTE — Progress Notes (Signed)
Pre visit review using our clinic review tool, if applicable. No additional management support is needed unless otherwise documented below in the visit note. 

## 2013-10-02 NOTE — Progress Notes (Signed)
   Subjective:    Patient ID: Marissa Salazar, female    DOB: [birth year], 33 y.o.   MRN: [id number]  HPI Has poison ivy or something all over Had been out cleaning on Saturday --cutting weeds, etc Wasn't wearing gloves Did wash hands just after  Rash came out yesterday Around eyes are swollen  Arms, abdomen, fingers  benedryl didn't do much-- but did help her sleep Current Outpatient Prescriptions on File Prior to Visit  Medication Sig Dispense Refill  . cyclobenzaprine (FLEXERIL) 10 MG tablet Take 1 tablet (10 mg total) by mouth 3 (three) times daily as needed for muscle spasms.  30 tablet  0   No current facility-administered medications on file prior to visit.    No Known Allergies  Past Medical History  Diagnosis Date  . Abnormal Pap smear     in past; normal colpo  . Allergic rhinitis     Past Surgical History  Procedure Laterality Date  . Colposcopy  2009    Family History  Problem Relation Age of Onset  . Pancreatic cancer Maternal Aunt   . Colon cancer Maternal Grandmother 42  . Breast cancer      GP  . Lung cancer      GP    History   Social History  . Marital Status: Married    Spouse Name: N/A    Number of Children: 1  . Years of Education: N/A   Occupational History  . Student    Social History Main Topics  . Smoking status: Never Smoker   . Smokeless tobacco: Not on file  . Alcohol Use: Yes     Comment: Rare  . Drug Use: No  . Sexual Activity: Yes    Birth Control/ Protection: None   Other Topics Concern  . Not on file   Social History Narrative   Married      [name]; 95 y/o son      Ship broker; CMA program      Regular exercise         Review of Systems No cough or wheezing Scratchy throat but no swelling     Objective:   Physical Exam  Constitutional: She appears well-developed and well-nourished. No distress.  HENT:  Mouth/Throat: Oropharynx is clear and moist. No oropharyngeal exudate.  Eyes:  Mild periorbital swelling    Skin:  Scattered papulovesicular rash on shoulders, arms, abdomen          Assessment & Plan:

## 2013-10-02 NOTE — Assessment & Plan Note (Signed)
Not too bad but could worsen around eyes Will give prednisone and cortisone cream OTC antihistamines

## 2013-10-02 NOTE — Patient Instructions (Signed)
Please try cetirizine 10mg  daily or fexofenadine daily for the itching. You can still try benedryl at bedtime.

## 2013-12-06 ENCOUNTER — Other Ambulatory Visit: Payer: Self-pay | Admitting: Family Medicine

## 2013-12-06 DIAGNOSIS — Z01419 Encounter for gynecological examination (general) (routine) without abnormal findings: Secondary | ICD-10-CM

## 2013-12-09 ENCOUNTER — Encounter: Payer: Self-pay | Admitting: Internal Medicine

## 2013-12-12 ENCOUNTER — Other Ambulatory Visit (INDEPENDENT_AMBULATORY_CARE_PROVIDER_SITE_OTHER): Payer: 59

## 2013-12-12 DIAGNOSIS — Z01419 Encounter for gynecological examination (general) (routine) without abnormal findings: Secondary | ICD-10-CM

## 2013-12-12 DIAGNOSIS — Z0000 Encounter for general adult medical examination without abnormal findings: Secondary | ICD-10-CM

## 2013-12-12 LAB — CBC WITH DIFFERENTIAL/PLATELET
BASOS ABS: 0 10*3/uL (ref 0.0–0.1)
Basophils Relative: 0.6 % (ref 0.0–3.0)
Eosinophils Absolute: 0.3 10*3/uL (ref 0.0–0.7)
Eosinophils Relative: 4.3 % (ref 0.0–5.0)
HCT: 38.2 % (ref 36.0–46.0)
Hemoglobin: 12.7 g/dL (ref 12.0–15.0)
LYMPHS ABS: 2.5 10*3/uL (ref 0.7–4.0)
LYMPHS PCT: 40.4 % (ref 12.0–46.0)
MCHC: 33.3 g/dL (ref 30.0–36.0)
MCV: 85.8 fl (ref 78.0–100.0)
MONOS PCT: 5.3 % (ref 3.0–12.0)
Monocytes Absolute: 0.3 10*3/uL (ref 0.1–1.0)
Neutro Abs: 3.1 10*3/uL (ref 1.4–7.7)
Neutrophils Relative %: 49.4 % (ref 43.0–77.0)
PLATELETS: 197 10*3/uL (ref 150.0–400.0)
RBC: 4.46 Mil/uL (ref 3.87–5.11)
RDW: 13.9 % (ref 11.5–15.5)
WBC: 6.3 10*3/uL (ref 4.0–10.5)

## 2013-12-12 LAB — LIPID PANEL
Cholesterol: 172 mg/dL (ref 0–200)
HDL: 68 mg/dL (ref >39.00)
LDL Cholesterol: 90 mg/dL (ref 0–99)
NonHDL: 104
Total CHOL/HDL Ratio: 3
Triglycerides: 71 mg/dL (ref 0.0–149.0)
VLDL: 14.2 mg/dL (ref 0.0–40.0)

## 2013-12-12 LAB — COMPREHENSIVE METABOLIC PANEL
ALBUMIN: 3.3 g/dL — AB (ref 3.5–5.2)
ALT: 19 U/L (ref 0–35)
AST: 29 U/L (ref 0–37)
Alkaline Phosphatase: 36 U/L — ABNORMAL LOW (ref 39–117)
BILIRUBIN TOTAL: 0.6 mg/dL (ref 0.2–1.2)
BUN: 17 mg/dL (ref 6–23)
CHLORIDE: 106 meq/L (ref 96–112)
CO2: 26 meq/L (ref 19–32)
Calcium: 9.1 mg/dL (ref 8.4–10.5)
Creatinine, Ser: 0.8 mg/dL (ref 0.4–1.2)
GFR: 92.73 mL/min (ref >60.00)
Glucose, Bld: 80 mg/dL (ref 70–99)
POTASSIUM: 3.9 meq/L (ref 3.5–5.1)
SODIUM: 140 meq/L (ref 135–145)
Total Protein: 6.8 g/dL (ref 6.0–8.3)

## 2013-12-12 LAB — TSH: TSH: 2.14 u[IU]/mL (ref 0.35–4.50)

## 2013-12-17 ENCOUNTER — Ambulatory Visit (INDEPENDENT_AMBULATORY_CARE_PROVIDER_SITE_OTHER): Payer: 59 | Admitting: Family Medicine

## 2013-12-17 ENCOUNTER — Encounter: Payer: Self-pay | Admitting: Family Medicine

## 2013-12-17 VITALS — BP 104/66 | HR 59 | Temp 97.6°F | Ht 64.0 in | Wt 135.2 lb

## 2013-12-17 DIAGNOSIS — M25362 Other instability, left knee: Secondary | ICD-10-CM

## 2013-12-17 DIAGNOSIS — Z0000 Encounter for general adult medical examination without abnormal findings: Secondary | ICD-10-CM

## 2013-12-17 DIAGNOSIS — M25562 Pain in left knee: Secondary | ICD-10-CM | POA: Insufficient documentation

## 2013-12-17 NOTE — Assessment & Plan Note (Signed)
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 Given handout from sports med advisor.  Advised strengthening her upper thigh muscles. Call or return to clinic prn if these symptoms worsen or fail to improve as anticipated. The patient indicates understanding of these issues and agrees with the plan.

## 2013-12-17 NOTE — Progress Notes (Signed)
Subjective:    Patient ID: Marissa Salazar, female    DOB: 24-Sep-1980, 33 y.o.   MRN: [id number]  Knee Pain     33 yo pleasant G2P2 here for CPX .  Sees Dr. Harrington Challenger, OBGYN, appt in 2 weeks.  She and her husband are doing well, along with her two boys. Older son, [name], is adjusting to being a big brother.  She just ran her first half marathon. Since that race, she has had some left knee pain/popping. No swelling.  Knee not giving out on her.  Seems to be worse after she has been running for few miles.  She has not noticed if worse after sitting long periods of time.  Lab Results  Component Value Date   CHOL 172 12/12/2013   HDL 68.00 12/12/2013   LDLCALC 90 12/12/2013   LDLDIRECT 59 11/28/2008   TRIG 71.0 12/12/2013   CHOLHDL 3 12/12/2013   Lab Results  Component Value Date   WBC 6.3 12/12/2013   HGB 12.7 12/12/2013   HCT 38.2 12/12/2013   MCV 85.8 12/12/2013   PLT 197.0 12/12/2013   Lab Results  Component Value Date   NA 140 12/12/2013   K 3.9 12/12/2013   CL 106 12/12/2013   CO2 26 12/12/2013   Lab Results  Component Value Date   CREATININE 0.8 12/12/2013     Patient Active Problem List   Diagnosis Date Noted  . Well woman exam (no gynecological exam) 12/17/2013  . 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 GERD 05/14/2008   Past Medical History  Diagnosis Date  . Abnormal Pap smear     in past; normal colpo  . Allergic rhinitis    Past Surgical History  Procedure Laterality Date  . Colposcopy  2009   History  Substance Use Topics  . Smoking status: Never Smoker   . Smokeless tobacco: Not on file  . Alcohol Use: Yes     Comment: Rare   Family History  Problem Relation Age of Onset  . Pancreatic cancer Maternal Aunt   . Colon cancer Maternal Grandmother 24  . Breast cancer      GP  . Lung cancer      GP   No Known Allergies Current Outpatient Prescriptions on File Prior to Visit  Medication Sig Dispense Refill  .  cyclobenzaprine (FLEXERIL) 10 MG tablet Take 1 tablet (10 mg total) by mouth 3 (three) times daily as needed for muscle spasms. 30 tablet 0  . Multiple Vitamin (MULTIVITAMIN) tablet Take 1 tablet by mouth daily.     No current facility-administered medications on file prior to visit.   The PMH, PSH, Social History, Family History, Medications, and allergies have been reviewed in [facility], and have been updated if relevant.    Review of Systems  Constitutional: Negative.   HENT: Negative.   Respiratory: Negative.   Cardiovascular: Negative.   Gastrointestinal: Negative.   Musculoskeletal: Negative.   Skin: Negative.   Allergic/Immunologic: Negative.   Neurological: Negative.   Hematological: Negative.   Psychiatric/Behavioral: Negative.   All other systems reviewed and are negative.  See HPI      Objective:   Physical Exam BP 104/66 mmHg  Pulse 59  Temp(Src) 97.6 F (36.4 C) (Oral)  Ht 5\' 4"  (1.626 m)  Wt 135 lb 4 oz (61.349 kg)  BMI 23.20 kg/m2  SpO2 99%  LMP 12/10/2013   General:  Well-developed,well-nourished,in no acute distress; alert,appropriate and cooperative throughout examination Head:  normocephalic and atraumatic.   Eyes:  vision grossly intact, pupils equal, pupils round, and pupils reactive to light.   Ears:  R ear normal and L ear normal.   Nose:  no external deformity.   Mouth:  good dentition.   Neck:  No deformities, masses, or tenderness noted.  Lungs:  Normal respiratory effort, chest expands symmetrically. Lungs are clear to auscultation, no crackles or wheezes. Heart:  Normal rate and regular rhythm. S1 and S2 normal without gallop, murmur, click, rub or other extra sounds. Abdomen:  Bowel sounds positive,abdomen soft and non-tender without masses, organomegaly or hernias noted. Extremities:  No clubbing, cyanosis, edema, or deformity noted with normal full range of motion of all joints.   No TTP over lateral joint space Neurologic:  alert &  oriented X3 and gait normal.   Skin:  Intact without suspicious lesions or rashes Cervical Nodes:  No lymphadenopathy noted Axillary Nodes:  No palpable lymphadenopathy Psych:  Cognition and judgment appear intact. Alert and cooperative with normal attention span and concentration. No apparent delusions, illusions, hallucinations      Assessment & Plan:

## 2013-12-17 NOTE — Assessment & Plan Note (Signed)
Reviewed preventive care protocols, scheduled due services, and updated immunizations Discussed nutrition, exercise, diet, and healthy lifestyle.  

## 2013-12-17 NOTE — Progress Notes (Signed)
Pre visit review using our clinic review tool, if applicable. No additional management support is needed unless otherwise documented below in the visit note. 

## 2014-01-01 LAB — HM PAP SMEAR: HM Pap smear: NEGATIVE

## 2014-05-01 ENCOUNTER — Ambulatory Visit (INDEPENDENT_AMBULATORY_CARE_PROVIDER_SITE_OTHER): Payer: 59 | Admitting: Family Medicine

## 2014-05-01 VITALS — BP 102/72 | HR 70 | Temp 97.4°F | Resp 16 | Ht 65.0 in | Wt 132.2 lb

## 2014-05-01 DIAGNOSIS — J0111 Acute recurrent frontal sinusitis: Secondary | ICD-10-CM

## 2014-05-01 MED ORDER — AMOXICILLIN 500 MG PO CAPS: 1000.0000 mg | ORAL_CAPSULE | Freq: Two times a day (BID) | ORAL | Status: DC

## 2014-05-01 NOTE — Patient Instructions (Signed)
We are going to treat you for a sinus infection with amoxicillin.   Use an afrin spray as needed for a few days- however be sure to stop using this after 4 or 5 days Let me know if you are not feeling better soon- Sooner if worse.

## 2014-05-01 NOTE — Progress Notes (Signed)
[facility] [address] [place] [address] [phone]  Date:  05/01/2014   Name:  Marissa Salazar   DOB:  Jun 06, 1980   MRN:  [id number]  PCP:  Arnette Norris, MD    Chief Complaint: Nasal Congestion; Ear Fullness; and ear pressure   History of Present Illness:  Marissa Salazar is a 34 y.o. very pleasant female patient who presents with the following:  She noted sx of a cold about a week ago. These sx seem to have developed into a sinus infection She feels pressure in her ears, and pain in her face when she bends down No cough She has tried some sudafed and essential oils No GI symptoms.   She has some ST from PND She is generally in good health   LMP 2/29 history of seasonal allergies   Patient Active Problem List   Diagnosis Date Noted  . Well woman exam (no gynecological exam) 12/17/2013  . 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 GERD 05/14/2008    Past Medical History  Diagnosis Date  . Abnormal Pap smear     in past; normal colpo  . Allergic rhinitis   . Allergy     Past Surgical History  Procedure Laterality Date  . Colposcopy  2009    History  Substance Use Topics  . Smoking status: Never Smoker   . Smokeless tobacco: Not on file  . Alcohol Use: Yes     Comment: Rare    Family History  Problem Relation Age of Onset  . Pancreatic cancer Maternal Aunt   . Colon cancer Maternal Grandmother 54  . Breast cancer      GP  . Lung cancer      GP  . Diabetes Paternal Uncle     No Known Allergies  Medication list has been reviewed and updated.  Current Outpatient Prescriptions on File Prior to Visit  Medication Sig Dispense Refill  . cyclobenzaprine (FLEXERIL) 10 MG tablet Take 1 tablet (10 mg total) by mouth 3 (three) times daily as needed for muscle spasms. 30 tablet 0  . Multiple Vitamin (MULTIVITAMIN) tablet Take 1 tablet by mouth daily.     No current facility-administered medications on file  prior to visit.    Review of Systems:  As per HPI- otherwise negative.   Physical Examination: Filed Vitals:   05/01/14 1810  BP: 102/72  Pulse: 70  Temp: 97.4 F (36.3 C)  Resp: 16   Filed Vitals:   05/01/14 1810  Height: 5\' 5"  (1.651 m)  Weight: 132 lb 3.2 oz (59.966 kg)   Body mass index is 22 kg/(m^2). Ideal Body Weight: Weight in (lb) to have BMI = 25: 149.9  GEN: WDWN, NAD, Non-toxic, A & O x 3, looks well HEENT: Atraumatic, Normocephalic. Neck supple. No masses, No LAD.  Bilateral TM wnl, oropharynx normal.  PEERL,EOMI.   Sinuses are TTP, nasal cavity congested Ears and Nose: No external deformity. CV: RRR, No M/G/R. No JVD. No thrill. No extra heart sounds. PULM: CTA B, no wheezes, crackles, rhonchi. No retractions. No resp. distress. No accessory muscle use. EXTR: No c/c/e NEURO Normal gait.  PSYCH: Normally interactive. Conversant. Not depressed or anxious appearing.  Calm demeanor.    Assessment and Plan: Acute recurrent frontal sinusitis - Plan: amoxicillin (AMOXIL) 500 MG capsule  Treat with amoxicillin as above Follow-up if not better in the next few days- Sooner if worse.  Signed Lamar Blinks, MD

## 2014-05-08 ENCOUNTER — Other Ambulatory Visit: Payer: Self-pay | Admitting: Family Medicine

## 2014-05-08 MED ORDER — CYCLOBENZAPRINE HCL 10 MG PO TABS: 10.0000 mg | ORAL_TABLET | Freq: Three times a day (TID) | ORAL | Status: DC | PRN

## 2014-05-28 ENCOUNTER — Encounter: Payer: Self-pay | Admitting: Family Medicine

## 2014-05-28 ENCOUNTER — Other Ambulatory Visit: Payer: Self-pay | Admitting: Family Medicine

## 2014-05-28 ENCOUNTER — Ambulatory Visit (INDEPENDENT_AMBULATORY_CARE_PROVIDER_SITE_OTHER): Payer: 59 | Admitting: Family Medicine

## 2014-05-28 VITALS — BP 124/76 | HR 68 | Temp 97.8°F | Wt 134.5 lb

## 2014-05-28 DIAGNOSIS — N644 Mastodynia: Secondary | ICD-10-CM

## 2014-05-28 DIAGNOSIS — Z803 Family history of malignant neoplasm of breast: Secondary | ICD-10-CM | POA: Insufficient documentation

## 2014-05-28 DIAGNOSIS — N63 Unspecified lump in unspecified breast: Secondary | ICD-10-CM

## 2014-05-28 NOTE — Assessment & Plan Note (Signed)
New- likely bruising from fall but give new breast pain, will proceed with ultrasound and mammogram of left breast for further evaluation.  Does have family h/o breast CA.  See below.

## 2014-05-28 NOTE — Progress Notes (Signed)
Subjective:   Patient ID: Marissa Salazar, female    DOB: [birth year], 34 y.o.   MRN: [id number]  Marissa Salazar is a pleasant 34 y.o. year old female who presents to clinic today with Breast Pain  on 05/28/2014  HPI:  Feel getting her son into her husband's truck 2 weeks ago, landed on her left side but does not remember hitting her breast.  Shortly after that, left lateral breast sore and tingling at times.  She thinks she can palpate a mass too.  Maternal grandmother died of breast CA in her 68s.  Maternal aunt just diagnosed with breast CA in her 81s.  Current Outpatient Prescriptions on File Prior to Visit  Medication Sig Dispense Refill  . cyclobenzaprine (FLEXERIL) 10 MG tablet Take 1 tablet (10 mg total) by mouth 3 (three) times daily as needed for muscle spasms. 30 tablet 0  . Multiple Vitamin (MULTIVITAMIN) tablet Take 1 tablet by mouth daily.     No current facility-administered medications on file prior to visit.    No Known Allergies  Past Medical History  Diagnosis Date  . Abnormal Pap smear     in past; normal colpo  . Allergic rhinitis   . Allergy     Past Surgical History  Procedure Laterality Date  . Colposcopy  2009    Family History  Problem Relation Age of Onset  . Pancreatic cancer Maternal Aunt   . Breast cancer Maternal Aunt   . Colon cancer Maternal Grandmother 70  . Breast cancer Maternal Grandmother   . Breast cancer      GP  . Lung cancer      GP  . Diabetes Paternal Uncle     History   Social History  . Marital Status: Married    Spouse Name: N/A  . Number of Children: 1  . Years of Education: N/A   Occupational History  . Student    Social History Main Topics  . Smoking status: Never Smoker   . Smokeless tobacco: Not on file  . Alcohol Use: Yes     Comment: Rare  . Drug Use: No  . Sexual Activity: Yes    Birth Control/ Protection: None   Other Topics Concern  . Not on file   Social History Narrative   Married      Marissa Salazar; 32 y/o son      Ship broker; CMA program      Regular exercise         The PMH, PSH, Social History, Family History, Medications, and allergies have been reviewed in [facility], and have been updated if relevant.     Review of Systems  Constitutional: Negative.   Respiratory: Negative for shortness of breath.   Genitourinary: Negative.   Musculoskeletal: Negative.   Hematological: Negative.   Psychiatric/Behavioral: Negative.   All other systems reviewed and are negative.      Objective:    BP 124/76 mmHg  Pulse 68  Temp(Src) 97.8 F (36.6 C) (Oral)  Wt 134 lb 8 oz (61.009 kg)  SpO2 97%  LMP 05/05/2014   Physical Exam  Constitutional: She is oriented to person, place, and time. She appears well-developed and well-nourished. No distress.  HENT:  Head: Normocephalic and atraumatic.  Eyes: Conjunctivae are normal.  Neck: Normal range of motion.  Cardiovascular: Normal rate.   Pulmonary/Chest: Effort normal. Right breast exhibits no inverted nipple, no mass, no nipple discharge, no skin change and no tenderness. Left breast exhibits tenderness. Left  breast exhibits no inverted nipple, no mass, no nipple discharge and no skin change. Breasts are symmetrical.    Neurological: She is alert and oriented to person, place, and time. No cranial nerve deficit.  Skin: Skin is warm and dry.  Psychiatric: She has a normal mood and affect. Her behavior is normal. Judgment and thought content normal.  Nursing note and vitals reviewed.         Assessment & Plan:   Breast pain, left - Plan: MM Digital Diagnostic Unilat L, US BREAST COMPLETE UNI LEFT INC AXILLA No Follow-up on file.

## 2014-05-28 NOTE — Assessment & Plan Note (Signed)
She asked to discuss her risks of breast CA, given her family history. >25 minutes spent in face to face time with patient, >50% spent in counselling or coordination of care. Does have family history, no first degree relatives. She is asking about getting BRCA testing.  I suggested that she await her aunt's test results before she pursues this as in terms of family history, she is not high risk at this point. The patient indicates understanding of these issues and agrees with the plan.

## 2014-05-28 NOTE — Progress Notes (Signed)
Pre visit review using our clinic review tool, if applicable. No additional management support is needed unless otherwise documented below in the visit note. 

## 2014-05-28 NOTE — Patient Instructions (Signed)
Good to see you. Please stop by to see Rosaria Ferries on your way out to set up your imaging.  I will call you as soon as I have the results.

## 2014-06-03 ENCOUNTER — Ambulatory Visit
Admission: RE | Admit: 2014-06-03 | Discharge: 2014-06-03 | Disposition: A | Discharge status: Home / Self Care | Payer: 59 | Source: Ambulatory Visit | Attending: Family Medicine | Admitting: Family Medicine

## 2014-06-03 DIAGNOSIS — N63 Unspecified lump in unspecified breast: Secondary | ICD-10-CM

## 2014-06-03 DIAGNOSIS — N644 Mastodynia: Secondary | ICD-10-CM

## 2014-06-04 ENCOUNTER — Encounter: Payer: Self-pay | Admitting: Family Medicine

## 2014-06-04 ENCOUNTER — Other Ambulatory Visit: Payer: Self-pay | Admitting: Family Medicine

## 2014-06-04 ENCOUNTER — Ambulatory Visit (INDEPENDENT_AMBULATORY_CARE_PROVIDER_SITE_OTHER): Payer: 59 | Admitting: Family Medicine

## 2014-06-04 VITALS — BP 118/64 | HR 63 | Temp 97.8°F | Wt 135.5 lb

## 2014-06-04 DIAGNOSIS — N644 Mastodynia: Secondary | ICD-10-CM

## 2014-06-04 DIAGNOSIS — R1030 Lower abdominal pain, unspecified: Secondary | ICD-10-CM | POA: Diagnosis not present

## 2014-06-04 DIAGNOSIS — D242 Benign neoplasm of left breast: Secondary | ICD-10-CM

## 2014-06-04 DIAGNOSIS — R109 Unspecified abdominal pain: Secondary | ICD-10-CM | POA: Insufficient documentation

## 2014-06-04 DIAGNOSIS — R202 Paresthesia of skin: Secondary | ICD-10-CM | POA: Insufficient documentation

## 2014-06-04 DIAGNOSIS — R42 Dizziness and giddiness: Secondary | ICD-10-CM

## 2014-06-04 LAB — COMPREHENSIVE METABOLIC PANEL
ALK PHOS: 41 U/L (ref 39–117)
ALT: 19 U/L (ref 0–35)
AST: 1 U/L (ref 0–37)
Albumin: 4 g/dL (ref 3.5–5.2)
BUN: 10 mg/dL (ref 6–23)
CALCIUM: 9.2 mg/dL (ref 8.4–10.5)
CHLORIDE: 104 meq/L (ref 96–112)
CO2: 32 mEq/L (ref 19–32)
CREATININE: 1.25 mg/dL — AB (ref 0.40–1.20)
GFR: 52.85 mL/min — ABNORMAL LOW (ref >60.00)
Glucose, Bld: 89 mg/dL (ref 70–99)
Potassium: 4.1 mEq/L (ref 3.5–5.1)
Sodium: 138 mEq/L (ref 135–145)
Total Bilirubin: 0.4 mg/dL (ref 0.2–1.2)
Total Protein: 6.7 g/dL (ref 6.0–8.3)

## 2014-06-04 LAB — CBC WITH DIFFERENTIAL/PLATELET
BASOS ABS: 0 10*3/uL (ref 0.0–0.1)
Basophils Relative: 0.4 % (ref 0.0–3.0)
Eosinophils Absolute: 0.4 10*3/uL (ref 0.0–0.7)
Eosinophils Relative: 6.1 % — ABNORMAL HIGH (ref 0.0–5.0)
HCT: 35.3 % — ABNORMAL LOW (ref 36.0–46.0)
HEMOGLOBIN: 12 g/dL (ref 12.0–15.0)
LYMPHS PCT: 31.4 % (ref 12.0–46.0)
Lymphs Abs: 1.9 10*3/uL (ref 0.7–4.0)
MCHC: 34 g/dL (ref 30.0–36.0)
MCV: 81.9 fl (ref 78.0–100.0)
Monocytes Absolute: 0.4 10*3/uL (ref 0.1–1.0)
Monocytes Relative: 6.5 % (ref 3.0–12.0)
Neutro Abs: 3.3 10*3/uL (ref 1.4–7.7)
Neutrophils Relative %: 55.6 % (ref 43.0–77.0)
PLATELETS: 226 10*3/uL (ref 150.0–400.0)
RBC: 4.32 Mil/uL (ref 3.87–5.11)
RDW: 15.3 % (ref 11.5–15.5)
WBC: 5.9 10*3/uL (ref 4.0–10.5)

## 2014-06-04 LAB — LIPASE: LIPASE: 51 U/L (ref 11.0–59.0)

## 2014-06-04 NOTE — Progress Notes (Signed)
Subjective:   Patient ID: Marissa Salazar, female    DOB: 11/08/1980, 34 y.o.   MRN: [id number]  Alaa A Marsan is a pleasant 34 y.o. year old female who presents to clinic today with her husband Follow-up  on 06/04/2014  HPI:  Left side breast pain- saw her 1 week ago for this complaint after she fell on her left side.   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  She was offered a biopsy but wanted to think about it.  Left side and breast pain has persisted and now she has left arm numbness and tingling along with left lower abdominal pain. No nausea or vomiting.  Was dizzy yesterday.  Tearful- anxious about this pain and her breast masses.  Current Outpatient Prescriptions on File Prior to Visit  Medication Sig Dispense Refill  . cyclobenzaprine (FLEXERIL) 10 MG tablet Take 1 tablet (10 mg total) by mouth 3 (three) times daily as needed for muscle spasms. 30 tablet 0  . Multiple Vitamin (MULTIVITAMIN) tablet Take 1 tablet by mouth daily.     No current facility-administered medications on file prior to visit.    No Known Allergies  Past Medical History  Diagnosis Date  . Abnormal Pap smear     in past; normal colpo  . Allergic rhinitis   . Allergy     Past Surgical History  Procedure Laterality Date  . Colposcopy  2009    Family History  Problem Relation Age of Onset  . Pancreatic cancer Maternal Aunt   . Breast cancer Maternal Aunt   . Colon cancer Maternal Grandmother 40  . Breast cancer Maternal Grandmother   . Breast cancer      GP  . Lung cancer      GP  . Diabetes Paternal Uncle     History   Social History  . Marital Status: Married    Spouse Name: N/A  . Number of Children: 1  . Years of Education: N/A   Occupational History  . Student    Social History Main Topics  . Smoking status: Never Smoker   . Smokeless tobacco: Not on file  . Alcohol Use: Yes   Comment: Rare  . Drug Use: No  . Sexual Activity: Yes    Birth Control/ Protection: None   Other Topics Concern  . Not on file   Social History Narrative   Married      [name]; 39 y/o son      Ship broker; CMA program      Regular exercise         The PMH, PSH, Social History, Family History, Medications, and allergies have been reviewed in [facility], and have been updated if relevant.    Review of Systems  Constitutional: Negative.   HENT: Negative.   Eyes: Negative.   Respiratory: Negative.   Cardiovascular: Negative for chest pain, palpitations and leg swelling.  Gastrointestinal: Positive for abdominal pain.  Endocrine: Negative.   Genitourinary: Negative for dysuria, urgency, vaginal bleeding, vaginal discharge and difficulty urinating.  Musculoskeletal: Positive for back pain.  Skin: Negative.   Allergic/Immunologic: Negative.   Neurological: Positive for dizziness. Negative for tremors, seizures, syncope, speech difficulty, weakness, numbness and headaches.  Hematological: Negative.   Psychiatric/Behavioral: Negative.   All other systems reviewed and are negative.      Objective:    BP 118/64 mmHg  Pulse 63  Temp(Src) 97.8 F (36.6 C) (Oral)  Wt 135 lb 8 oz (61.462 kg)  SpO2 99%  LMP 06/01/2014   Physical Exam  Constitutional: She is oriented to person, place, and time. She appears well-developed and well-nourished. No distress.  HENT:  Head: Normocephalic.  Eyes: Conjunctivae are normal.  Cardiovascular: Normal rate.   Pulmonary/Chest: Effort normal.  Abdominal: Soft. She exhibits no distension and no mass. There is no hepatosplenomegaly or hepatomegaly. There is tenderness in the epigastric area and left lower quadrant. There is no rebound and no guarding. No hernia. Hernia confirmed negative in the ventral area and confirmed negative in the left inguinal area.  Musculoskeletal: She exhibits no edema.       Left shoulder: She exhibits normal range of motion,  no tenderness, no bony tenderness, no swelling, no effusion, no crepitus, no deformity, no laceration, no pain, no spasm and normal strength.  Neurological: She is alert and oriented to person, place, and time. No cranial nerve deficit. Coordination normal.  Skin: Skin is warm and dry.  Psychiatric: She has a normal mood and affect. Her behavior is normal. Judgment and thought content normal.  Nursing note and vitals reviewed.         Assessment & Plan:   Lower abdominal pain - Plan: US Abdomen Complete, CBC with Differential/Platelet, Lipase, Comprehensive metabolic panel  Dizziness - Plan: EKG 12-Lead  Breast pain, left  Arm paresthesia, left No Follow-up on file.

## 2014-06-04 NOTE — Assessment & Plan Note (Signed)
Persistent. >25 minutes spent in face to face time with patient, >50% spent in counselling or coordination of care Also ordered EKG today given dizziness which was reassuring- NSR.  I would suggest that she proceed with biopsy given her level of anxiety concerning this and her husband agrees.  Some of her symptoms may be related to anxiety. The patient indicates understanding of these issues and agrees with the plan.

## 2014-06-04 NOTE — Progress Notes (Signed)
Pre visit review using our clinic review tool, if applicable. No additional management support is needed unless otherwise documented below in the visit note. 

## 2014-06-04 NOTE — Assessment & Plan Note (Signed)
New- unclear how it is related to her other symptoms.  This is the most recent in her constellation of symptoms. Abdominal US and labs for further evaluation. The patient indicates understanding of these issues and agrees with the plan. Orders Placed This Encounter  Procedures  . US Abdomen Complete  . CBC with Differential/Platelet  . Lipase  . Comprehensive metabolic panel  . EKG 12-Lead

## 2014-06-04 NOTE — Assessment & Plan Note (Signed)
New- FROM of shoulder and neck. ?brachial plexus given that tingling starts mid arm? Unclear at this point.  Advised NSAIDs with food and may need further work up if symptoms persist. The patient indicates understanding of these issues and agrees with the plan.

## 2014-06-04 NOTE — Patient Instructions (Signed)
Good to see you. We will call you with your lab results. Please stop by to see Rosaria Ferries to set up your ultrasound.

## 2014-06-05 ENCOUNTER — Other Ambulatory Visit: Payer: Self-pay | Admitting: Family Medicine

## 2014-06-05 DIAGNOSIS — D242 Benign neoplasm of left breast: Secondary | ICD-10-CM

## 2014-06-06 ENCOUNTER — Ambulatory Visit
Admission: RE | Admit: 2014-06-06 | Discharge: 2014-06-06 | Disposition: A | Discharge status: Home / Self Care | Payer: 59 | Source: Ambulatory Visit | Attending: Family Medicine | Admitting: Family Medicine

## 2014-06-06 ENCOUNTER — Telehealth: Payer: Self-pay | Admitting: Family Medicine

## 2014-06-06 DIAGNOSIS — R1030 Lower abdominal pain, unspecified: Secondary | ICD-10-CM

## 2014-06-06 NOTE — Telephone Encounter (Signed)
Spoke to pt and confirmed, again, her u/s results. Pt states that she will f/u prn if abdominal pain persist

## 2014-06-06 NOTE — Telephone Encounter (Signed)
Patient just spoke to [facility] and she said she has a question she'd like to ask [place].  Please call patient.

## 2014-06-12 ENCOUNTER — Ambulatory Visit
Admission: RE | Admit: 2014-06-12 | Discharge: 2014-06-12 | Disposition: A | Discharge status: Home / Self Care | Payer: 59 | Source: Ambulatory Visit | Attending: Family Medicine | Admitting: Family Medicine

## 2014-06-12 ENCOUNTER — Other Ambulatory Visit: Payer: 59

## 2014-06-12 DIAGNOSIS — D242 Benign neoplasm of left breast: Secondary | ICD-10-CM

## 2014-06-12 HISTORY — PX: BREAST BIOPSY: SHX20

## 2014-06-13 ENCOUNTER — Other Ambulatory Visit: Payer: Self-pay | Admitting: Family Medicine

## 2014-06-13 DIAGNOSIS — R799 Abnormal finding of blood chemistry, unspecified: Secondary | ICD-10-CM

## 2014-06-17 ENCOUNTER — Encounter: Payer: Self-pay | Admitting: Family Medicine

## 2014-06-17 ENCOUNTER — Other Ambulatory Visit (HOSPITAL_COMMUNITY): Payer: Self-pay | Admitting: Orthopedic Surgery

## 2014-06-17 ENCOUNTER — Other Ambulatory Visit: Payer: Self-pay | Admitting: Family Medicine

## 2014-06-17 DIAGNOSIS — M542 Cervicalgia: Secondary | ICD-10-CM

## 2014-06-17 DIAGNOSIS — R5383 Other fatigue: Secondary | ICD-10-CM

## 2014-06-20 ENCOUNTER — Other Ambulatory Visit (INDEPENDENT_AMBULATORY_CARE_PROVIDER_SITE_OTHER): Payer: 59

## 2014-06-20 DIAGNOSIS — R799 Abnormal finding of blood chemistry, unspecified: Secondary | ICD-10-CM

## 2014-06-20 DIAGNOSIS — R5383 Other fatigue: Secondary | ICD-10-CM

## 2014-06-20 LAB — TSH: TSH: 1.4 u[IU]/mL (ref 0.35–4.50)

## 2014-06-20 LAB — BASIC METABOLIC PANEL
BUN: 13 mg/dL (ref 6–23)
CALCIUM: 8.9 mg/dL (ref 8.4–10.5)
CO2: 26 meq/L (ref 19–32)
CREATININE: 0.82 mg/dL (ref 0.40–1.20)
Chloride: 106 mEq/L (ref 96–112)
GFR: 85.95 mL/min (ref >60.00)
Glucose, Bld: 83 mg/dL (ref 70–99)
Potassium: 4 mEq/L (ref 3.5–5.1)
SODIUM: 135 meq/L (ref 135–145)

## 2014-06-20 LAB — T4, FREE: Free T4: 0.92 ng/dL (ref 0.60–1.60)

## 2014-06-23 ENCOUNTER — Encounter: Payer: Self-pay | Admitting: Family Medicine

## 2014-06-27 ENCOUNTER — Ambulatory Visit (HOSPITAL_COMMUNITY)
Admission: RE | Admit: 2014-06-27 | Discharge: 2014-06-27 | Disposition: A | Discharge status: Home / Self Care | Payer: 59 | Source: Ambulatory Visit | Attending: Orthopedic Surgery | Admitting: Orthopedic Surgery

## 2014-06-27 DIAGNOSIS — M5022 Other cervical disc displacement, mid-cervical region: Secondary | ICD-10-CM | POA: Insufficient documentation

## 2014-06-27 DIAGNOSIS — M545 Low back pain: Secondary | ICD-10-CM | POA: Insufficient documentation

## 2014-06-27 DIAGNOSIS — M542 Cervicalgia: Secondary | ICD-10-CM | POA: Insufficient documentation

## 2014-08-15 ENCOUNTER — Ambulatory Visit: Payer: 59 | Admitting: Family Medicine

## 2014-08-15 ENCOUNTER — Telehealth: Payer: Self-pay | Admitting: Family Medicine

## 2014-08-15 NOTE — Telephone Encounter (Signed)
Patient did not come for their scheduled appointment today for sinus issues.  Please let me know if the patient needs to be contacted immediately for follow up or if no follow up is necessary.

## 2014-08-17 NOTE — Telephone Encounter (Signed)
No contact needed.  If the patient needs to be seen, she can schedule again.  Thanks.

## 2014-10-15 ENCOUNTER — Encounter: Payer: Self-pay | Admitting: General Surgery

## 2014-10-15 ENCOUNTER — Other Ambulatory Visit: Payer: 59

## 2014-10-15 ENCOUNTER — Ambulatory Visit (INDEPENDENT_AMBULATORY_CARE_PROVIDER_SITE_OTHER): Payer: 59 | Admitting: General Surgery

## 2014-10-15 VITALS — BP 126/74 | HR 78 | Resp 12 | Ht 64.0 in | Wt 137.0 lb

## 2014-10-15 DIAGNOSIS — N6002 Solitary cyst of left breast: Secondary | ICD-10-CM

## 2014-10-15 DIAGNOSIS — N644 Mastodynia: Secondary | ICD-10-CM

## 2014-10-15 DIAGNOSIS — D242 Benign neoplasm of left breast: Secondary | ICD-10-CM

## 2014-10-15 NOTE — Patient Instructions (Signed)
The patient is aware to call back for any questions or concerns.  

## 2014-10-15 NOTE — Progress Notes (Signed)
Patient ID: Marissa Salazar, female   DOB: 30-Sep-1980, 34 y.o.   MRN: [id number]  Chief Complaint  Patient presents with  . Breast Problem    cyst    HPI Marissa Salazar is a 34 y.o. female.  who presents for a breast evaluation. The most recent mammogram was done on 06-03-14. Left breast ultrasound 06-12-14. Patient does perform regular self breast checks. She states her left breast has been tender to touch since April when she fell. She does states that it is worse with her menstrual periods.  For few months now she has fel;t a lump in left breast on the inner aspect. This area and a focal area in [place] left breast are tender adn painful at times, worse with her periods. She had a mammogram and [place]. In the area of palpable finding no finduings. A small fibrosdenoma was noted near the areola-biopsied and confirmed. In the far UOQ on left a group of microcysts were seen.   HPI  Past Medical History  Diagnosis Date  . Abnormal Pap smear     in past; normal colpo  . Allergic rhinitis   . Allergy     Past Surgical History  Procedure Laterality Date  . Colposcopy  2009  . Bunionectomy Right 2013  . Breast biopsy Left 06-12-14    fibroadenoma    Family History  Problem Relation Age of Onset  . Pancreatic cancer Maternal Aunt   . Breast cancer Maternal Aunt   . Colon cancer Maternal Grandmother 4  . Breast cancer Maternal Grandmother   . Breast cancer      GP  . Lung cancer      GP  . Diabetes Paternal Uncle     Social History Social History  Substance Use Topics  . Smoking status: Never Smoker   . Smokeless tobacco: Never Used  . Alcohol Use: 0.0 oz/week    0 Standard drinks or equivalent per week     Comment: Rare    No Known Allergies  Current Outpatient Prescriptions  Medication Sig Dispense Refill  . Multiple Vitamin (MULTIVITAMIN) tablet Take 1 tablet by mouth daily.     No current facility-administered medications for this visit.    Review of Systems Review of  Systems  Constitutional: Negative.   Respiratory: Negative.   Cardiovascular: Negative.     Blood pressure 126/74, pulse 78, resp. rate 12, height 5\' 4"  (1.626 m), weight 137 lb (62.143 kg), last menstrual period 09/22/2014.  Physical Exam Physical Exam  Constitutional: She is oriented to person, place, and time. She appears well-developed and well-nourished.  HENT:  Mouth/Throat: Oropharynx is clear and moist.  Eyes: Conjunctivae are normal. No scleral icterus.  Neck: Neck supple.  Cardiovascular: Normal rate, regular rhythm and normal heart sounds.   Pulmonary/Chest: Effort normal and breath sounds normal. Right breast exhibits no inverted nipple, no mass, no nipple discharge, no skin change and no tenderness. Left breast exhibits no inverted nipple, no mass, no nipple discharge, no skin change and no tenderness.  Firm area at 8 o'clock left breast  Lymphadenopathy:    She has no cervical adenopathy.  Neurological: She is alert and oriented to person, place, and time.  Skin: Skin is warm.  Psychiatric: Her behavior is normal.    Data Reviewed Mammogram and ultrasound reviewed. [place] was repeated. At the 8 ocl palpable area no findings. At the area of tenderness in far uoq on left where she has pain, few tiny cysts are seen.  Assessment    Breast cysts. Fibroadenoma. Pain likely from the microcysts.     Plan Recommended use of Ibuprofen or Aleve for 5-7 days    Continue self breast exams. Call office for any new breast issues or concerns. Follow up next week- close to onset of her period and will repeat US of the left breast over site of pain. PCP:  Marissa Salazar  Salazar,Marissa G 10/20/2014, 8:00 AM

## 2014-10-20 ENCOUNTER — Encounter: Payer: Self-pay | Admitting: General Surgery

## 2014-11-19 ENCOUNTER — Encounter: Payer: Self-pay | Admitting: Family Medicine

## 2015-01-30 ENCOUNTER — Other Ambulatory Visit: Payer: Self-pay | Admitting: Obstetrics and Gynecology

## 2015-01-30 LAB — HM PAP SMEAR: HM Pap smear: NEGATIVE

## 2015-02-03 LAB — CYTOLOGY - PAP

## 2015-02-06 ENCOUNTER — Ambulatory Visit: Payer: 59 | Admitting: Family Medicine

## 2015-02-08 ENCOUNTER — Emergency Department (HOSPITAL_COMMUNITY): Payer: 59

## 2015-02-08 ENCOUNTER — Encounter (HOSPITAL_COMMUNITY): Payer: Self-pay | Admitting: *Deleted

## 2015-02-08 ENCOUNTER — Emergency Department (HOSPITAL_COMMUNITY)
Admission: EM | Admit: 2015-02-08 | Discharge: 2015-02-08 | Disposition: A | Discharge status: Home / Self Care | Payer: 59 | Attending: Emergency Medicine | Admitting: Emergency Medicine

## 2015-02-08 DIAGNOSIS — R1032 Left lower quadrant pain: Secondary | ICD-10-CM

## 2015-02-08 DIAGNOSIS — R102 Pelvic and perineal pain: Secondary | ICD-10-CM

## 2015-02-08 DIAGNOSIS — Z79899 Other long term (current) drug therapy: Secondary | ICD-10-CM | POA: Insufficient documentation

## 2015-02-08 DIAGNOSIS — D259 Leiomyoma of uterus, unspecified: Secondary | ICD-10-CM | POA: Diagnosis not present

## 2015-02-08 DIAGNOSIS — R109 Unspecified abdominal pain: Secondary | ICD-10-CM | POA: Diagnosis not present

## 2015-02-08 DIAGNOSIS — Z3202 Encounter for pregnancy test, result negative: Secondary | ICD-10-CM | POA: Diagnosis not present

## 2015-02-08 LAB — CBC
HCT: 41.5 % (ref 36.0–46.0)
Hemoglobin: 13.6 g/dL (ref 12.0–15.0)
MCH: 28.5 pg (ref 26.0–34.0)
MCHC: 32.8 g/dL (ref 30.0–36.0)
MCV: 86.8 fL (ref 78.0–100.0)
Platelets: 248 10*3/uL (ref 150–400)
RBC: 4.78 MIL/uL (ref 3.87–5.11)
RDW: 13.4 % (ref 11.5–15.5)
WBC: 6.5 10*3/uL (ref 4.0–10.5)

## 2015-02-08 LAB — COMPREHENSIVE METABOLIC PANEL
ALT: 24 U/L (ref 14–54)
AST: 32 U/L (ref 15–41)
Albumin: 3.8 g/dL (ref 3.5–5.0)
Alkaline Phosphatase: 35 U/L — ABNORMAL LOW (ref 38–126)
Anion gap: 5 (ref 5–15)
BUN: 9 mg/dL (ref 6–20)
CO2: 28 mmol/L (ref 22–32)
Calcium: 9.2 mg/dL (ref 8.9–10.3)
Chloride: 104 mmol/L (ref 101–111)
Creatinine, Ser: 0.77 mg/dL (ref 0.44–1.00)
GFR calc Af Amer: >60 mL/min (ref >60)
GFR calc non Af Amer: >60 mL/min (ref >60)
Glucose, Bld: 86 mg/dL (ref 65–99)
Potassium: 4.4 mmol/L (ref 3.5–5.1)
Sodium: 137 mmol/L (ref 135–145)
Total Bilirubin: 0.9 mg/dL (ref 0.3–1.2)
Total Protein: 7.1 g/dL (ref 6.5–8.1)

## 2015-02-08 LAB — URINALYSIS, ROUTINE W REFLEX MICROSCOPIC
Bilirubin Urine: NEGATIVE
Glucose, UA: NEGATIVE mg/dL
Ketones, ur: NEGATIVE mg/dL
Leukocytes, UA: NEGATIVE
Nitrite: NEGATIVE
Protein, ur: NEGATIVE mg/dL
Specific Gravity, Urine: 1.015 (ref 1.005–1.030)
pH: 7 (ref 5.0–8.0)

## 2015-02-08 LAB — URINE MICROSCOPIC-ADD ON
Bacteria, UA: NONE SEEN
WBC, UA: NONE SEEN WBC/hpf (ref 0–5)

## 2015-02-08 LAB — I-STAT BETA HCG BLOOD, ED (MC, WL, AP ONLY): I-stat hCG, quantitative: <5 m[IU]/mL (ref <5)

## 2015-02-08 LAB — LIPASE, BLOOD: Lipase: 36 U/L (ref 11–51)

## 2015-02-08 MED ORDER — SODIUM CHLORIDE 0.9 % IV BOLUS (SEPSIS)
1000.0000 mL | Freq: Once | INTRAVENOUS | Status: AC
  Administered 2015-02-08: 1000 mL via INTRAVENOUS

## 2015-02-08 MED ORDER — DIPHENHYDRAMINE HCL 50 MG/ML IJ SOLN
25.0000 mg | Freq: Once | INTRAMUSCULAR | Status: AC
  Administered 2015-02-08: 25 mg via INTRAVENOUS

## 2015-02-08 MED ORDER — HYDROCODONE-ACETAMINOPHEN 5-325 MG PO TABS: 1.0000 | ORAL_TABLET | Freq: Four times a day (QID) | ORAL | Status: DC | PRN

## 2015-02-08 MED ORDER — FENTANYL CITRATE (PF) 100 MCG/2ML IJ SOLN
50.0000 ug | Freq: Once | INTRAMUSCULAR | Status: AC
  Administered 2015-02-08: 50 ug via INTRAVENOUS
  Filled 2015-02-08: qty 2

## 2015-02-08 MED ORDER — IOHEXOL 300 MG/ML  SOLN
100.0000 mL | Freq: Once | INTRAMUSCULAR | Status: AC | PRN
  Administered 2015-02-08: 100 mL via INTRAVENOUS

## 2015-02-08 MED ORDER — DIPHENHYDRAMINE HCL 50 MG/ML IJ SOLN
INTRAMUSCULAR | Status: AC
  Filled 2015-02-08: qty 1

## 2015-02-08 MED ORDER — IBUPROFEN 800 MG PO TABS: 800.0000 mg | ORAL_TABLET | Freq: Three times a day (TID) | ORAL | Status: DC | PRN

## 2015-02-08 NOTE — ED Notes (Signed)
Pt returned from CT reporting itching, feeling of difficulty breathing with contrast.  Red hives noted to throat, chin and forehead.  Redness noted to chest.  Pt denies difficulty breathing at this time.

## 2015-02-08 NOTE — ED Notes (Signed)
Pt reports LLQ x 2 weeks, radiates into her back. Has been to pcp and treated for possible UTI.

## 2015-02-08 NOTE — Discharge Instructions (Signed)
Use Vicodin and ibuprofen as directed as needed for pain but don't drive while taking vicodin. Follow up with your OBGYN in 3-4 days for ongoing management of your uterine fibroid and abdominal pain. Increase your fluid intake. Return to the ER for changes or worsening symptoms.    Pelvic Pain, Female Pelvic pain is pain felt below the belly button and between your hips. It can be caused by many different things. It is important to get help right away. This is especially true for severe, sharp, or unusual pain that comes on suddenly.  HOME CARE  Only take medicine as told by your doctor.  Rest as told by your doctor.  Eat a healthy diet, such as fruits, vegetables, and lean meats.  Drink enough fluids to keep your pee (urine) clear or pale yellow, or as told.  Avoid sex (intercourse) if it causes pain.  Apply warm or cold packs to your lower belly (abdomen). Use the type of pack that helps the pain.  Avoid situations that cause you stress.  Keep a journal to track your pain. Write down:  When the pain started.  Where it is located.  If there are things that seem to be related to the pain, such as food or your period.  Follow up with your doctor as told. GET HELP RIGHT AWAY IF:   You have heavy bleeding from the vagina.  You have more pelvic pain.  You feel lightheaded or pass out (faint).  You have chills.  You have pain when you pee or have blood in your pee.  You cannot stop having watery poop (diarrhea).  You cannot stop throwing up (vomiting).  You have a fever or lasting symptoms for more than 3 days.  You have a fever and your symptoms suddenly get worse.  You are being physically or sexually abused.  Your medicine does not help your pain.  You have fluid (discharge) coming from your vagina that is not normal. MAKE SURE YOU:  Understand these instructions.  Will watch your condition.  Will get help if you are not doing well or get worse.   This  information is not intended to replace advice given to you by your health care provider. Make sure you discuss any questions you have with your health care provider.   Document Released: 07/13/2007 Document Revised: 02/14/2014 Document Reviewed: 05/16/2011 Elsevier Interactive Patient Education 2016 Elsevier [employer]  Uterine Fibroids Uterine fibroids are tissue masses (tumors). They are also called leiomyomas. They can develop inside of a woman's womb (uterus). They can grow very large. Fibroids are not cancerous (benign). Most fibroids do not require medical treatment. HOME CARE  Keep all follow-up visits as told by your doctor. This is important.  Take medicines only as told by your doctor.  If you were prescribed a hormone treatment, take the hormone medicines exactly as told.  Do not take aspirin. It can cause bleeding.  Ask your doctor about taking iron pills and increasing the amount of dark green, leafy vegetables in your diet. These actions can help to boost your blood iron levels.  Pay close attention to your period. Tell your doctor about any changes, such as:  Increased blood flow. This may require you to use more pads or tampons than usual per month.  A change in the number of days that your period lasts per month.  A change in symptoms that come with your period, such as back pain or cramping in your belly area (abdomen). GET  HELP IF:  You have pain in your back or the area between your hip bones (pelvic area) that is not controlled by medicines.  You have pain in your abdomen that is not controlled with medicines.  You have an increase in bleeding between and during periods.  You soak tampons or pads in a half hour or less.  You feel lightheaded.  You feel extra tired.  You feel weak. GET HELP RIGHT AWAY IF:   You pass out (faint).  You have a sudden increase in pelvic pain.   This information is not intended to replace advice given to you by your health  care provider. Make sure you discuss any questions you have with your health care provider.   Document Released: 02/26/2010 Document Revised: 02/14/2014 Document Reviewed: 07/23/2013 Elsevier Interactive Patient Education [employer].

## 2015-02-08 NOTE — ED Provider Notes (Signed)
CSN: [id number]     Arrival date & time 02/08/15  1049 History   First MD Initiated Contact with Patient 02/08/15 1231     Chief Complaint  Patient presents with  . Abdominal Pain  . Back Pain     (Consider location/radiation/quality/duration/timing/severity/associated sxs/prior Treatment) HPI Patient presents to the emergency department with Left lower quadrant abdominal pain has been ongoing for 2 weeksThe patient states that the patient makes the pain worse.  She states that nothing she has taken has made her pain better.  The patient states that she does not have any chest pain, shortness breath, weakness, dizziness, fever, dysuria, rash, near syncope,lightheadedness, or syncope.  The patient states that she has had some difficulty with the stream of her urination. Past Medical History  Diagnosis Date  . Abnormal Pap smear     in past; normal colpo  . Allergic rhinitis   . Allergy    Past Surgical History  Procedure Laterality Date  . Colposcopy  2009  . Bunionectomy Right 2013  . Breast biopsy Left 06-12-14    fibroadenoma   Family History  Problem Relation Age of Onset  . Pancreatic cancer Maternal Aunt   . Breast cancer Maternal Aunt   . Colon cancer Maternal Grandmother 3  . Breast cancer Maternal Grandmother   . Breast cancer      GP  . Lung cancer      GP  . Diabetes Paternal Uncle    Social History  Substance Use Topics  . Smoking status: Never Smoker   . Smokeless tobacco: Never Used  . Alcohol Use: 0.0 oz/week    0 Standard drinks or equivalent per week     Comment: Rare   OB History    Gravida Para Term Preterm AB TAB SAB Ectopic Multiple Living   2 2 2  0 0 0 0 0 0 2      Obstetric Comments   1st Menstrual Cycle: 13  1st Pregnancy:  24      Review of Systems  All other systems negative except as documented in the HPI. All pertinent positives and negatives as reviewed in the HPI.  Allergies  Review of patient's allergies indicates no known  allergies.  Home Medications   Prior to Admission medications   Medication Sig Start Date End Date Taking? Authorizing Provider  Multiple Vitamin (MULTIVITAMIN) tablet Take 1 tablet by mouth daily.   Yes Historical Provider, MD   BP 114/74 mmHg  Pulse 98  Temp(Src) 98 F (36.7 C) (Oral)  Resp 18  Ht 5\' 4"  (1.626 m)  Wt 58.968 kg  BMI 22.30 kg/m2  SpO2 100%  LMP 02/08/2015 Physical Exam  Constitutional: She is oriented to person, place, and time. She appears well-developed and well-nourished. No distress.  HENT:  Head: Normocephalic and atraumatic.  Mouth/Throat: Oropharynx is clear and moist.  Eyes: Pupils are equal, round, and reactive to light.  Neck: Normal range of motion. Neck supple.  Cardiovascular: Normal rate, regular rhythm and normal heart sounds.  Exam reveals no gallop and no friction rub.   No murmur heard. Pulmonary/Chest: Effort normal and breath sounds normal. No respiratory distress. She has no wheezes.  Abdominal: Soft. Bowel sounds are normal. She exhibits no distension. There is tenderness. There is no rebound and no guarding.  Neurological: She is alert and oriented to person, place, and time. She exhibits normal muscle tone. Coordination normal.  Skin: Skin is warm and dry. No rash noted. No erythema.  Psychiatric:  She has a normal mood and affect. Her behavior is normal.  Nursing note and vitals reviewed.   ED Course  Procedures (including critical care time) Labs Review Labs Reviewed  COMPREHENSIVE METABOLIC PANEL - Abnormal; Notable for the following:    Alkaline Phosphatase 35 (*)    All other components within normal limits  URINALYSIS, ROUTINE W REFLEX MICROSCOPIC (NOT AT [facility]) - Abnormal; Notable for the following:    Hgb urine dipstick LARGE (*)    All other components within normal limits  URINE MICROSCOPIC-ADD ON - Abnormal; Notable for the following:    Squamous Epithelial / LPF 0-5 (*)    All other components within normal limits   LIPASE, BLOOD  CBC  I-STAT BETA HCG BLOOD, ED ([facility], [facility], [facility] ONLY)    Imaging Review Ct Abdomen Pelvis W Contrast  02/08/2015  CLINICAL DATA:  35 year old female with abdominal and pelvic pain for 2 weeks. EXAM: CT ABDOMEN AND PELVIS WITH CONTRAST TECHNIQUE: Multidetector CT imaging of the abdomen and pelvis was performed using the standard protocol following bolus administration of intravenous contrast. CONTRAST:  119mL OMNIPAQUE IOHEXOL 300 MG/ML  SOLN COMPARISON:  02/12/2013 CT FINDINGS: Lower chest:  Unremarkable Hepatobiliary: The liver and gallbladder are unremarkable. There is no evidence of biliary dilatation. Pancreas: Unremarkable Spleen: Unremarkable Adrenals/Urinary Tract: The kidneys, adrenal glands and bladder are unremarkable. Stomach/Bowel: Unremarkable. No evidence of bowel obstruction or focal bowel wall thickening. The appendix is normal. Vascular/Lymphatic: No enlarged lymph nodes or abdominal aortic aneurysm. Reproductive: The uterus and adnexal regions are unremarkable. Other: No free fluid, abscess or pneumoperitoneum. Musculoskeletal: No acute or suspicious abnormalities. IMPRESSION: Unremarkable exam.  No acute or significant abnormalities. Electronically Signed   By: Margarette Canada M.D.   On: 02/08/2015 14:10   I have personally reviewed and evaluated these images and lab results as part of my medical decision-making.   EKG Interpretation None      Patient is evaluated with [place] due to previous ovarian cyst. The patient is stable at this time.   Dalia Heading, PA-C 02/12/15 [place], MD 02/13/15 1021

## 2015-02-08 NOTE — ED Provider Notes (Signed)
Care assumed from [facility] PA-C at shift change. Pt here with 2wks of LLQ abd pain radiating to back. Had been seen by OBGYN who felt she may have a cyst, did a pelvic exam but no U/S. CT here was negative therefore decision was made to proceed with pelvic U/S to further eval. If negative, plan to d/c home with pain meds.  Physical Exam  BP 114/74 mmHg  Pulse 98  Temp(Src) 98 F (36.7 C) (Oral)  Resp 18  Ht 5\' 4"  (1.626 m)  Wt 58.968 kg  BMI 22.30 kg/m2  SpO2 100%  LMP 02/08/2015  Physical Exam Gen: afebrile, VSS, NAD HEENT: EOMI, MMM Resp: no resp distress CV: rate WNL Abd: Soft, nondistended, +BS throughout, with mild LLQ TTP, no r/g/r, neg murphy's, neg mcburney's, no CVA TTP  MsK: moving all extremities with ease Neuro: A&O x4  ED Course  Procedures Results for orders placed or performed during the hospital encounter of 02/08/15  Lipase, blood  Result Value Ref Range   Lipase 36 11 - 51 U/L  Comprehensive metabolic panel  Result Value Ref Range   Sodium 137 135 - 145 mmol/L   Potassium 4.4 3.5 - 5.1 mmol/L   Chloride 104 101 - 111 mmol/L   CO2 28 22 - 32 mmol/L   Glucose, Bld 86 65 - 99 mg/dL   BUN 9 6 - 20 mg/dL   Creatinine, Ser 0.77 0.44 - 1.00 mg/dL   Calcium 9.2 8.9 - 10.3 mg/dL   Total Protein 7.1 6.5 - 8.1 g/dL   Albumin 3.8 3.5 - 5.0 g/dL   AST 32 15 - 41 U/L   ALT 24 14 - 54 U/L   Alkaline Phosphatase 35 (L) 38 - 126 U/L   Total Bilirubin 0.9 0.3 - 1.2 mg/dL   GFR calc non Af Amer >60 >60 mL/min   GFR calc Af Amer >60 >60 mL/min   Anion gap 5 5 - 15  CBC  Result Value Ref Range   WBC 6.5 4.0 - 10.5 K/uL   RBC 4.78 3.87 - 5.11 MIL/uL   Hemoglobin 13.6 12.0 - 15.0 g/dL   HCT 41.5 36.0 - 46.0 %   MCV 86.8 78.0 - 100.0 fL   MCH 28.5 26.0 - 34.0 pg   MCHC 32.8 30.0 - 36.0 g/dL   RDW 13.4 11.5 - 15.5 %   Platelets 248 150 - 400 K/uL  Urinalysis, Routine w reflex microscopic (not at [facility])  Result Value Ref Range   Color, Urine YELLOW YELLOW    APPearance CLEAR CLEAR   Specific Gravity, Urine 1.015 1.005 - 1.030   pH 7.0 5.0 - 8.0   Glucose, UA NEGATIVE NEGATIVE mg/dL   Hgb urine dipstick LARGE (A) NEGATIVE   Bilirubin Urine NEGATIVE NEGATIVE   Ketones, ur NEGATIVE NEGATIVE mg/dL   Protein, ur NEGATIVE NEGATIVE mg/dL   Nitrite NEGATIVE NEGATIVE   Leukocytes, UA NEGATIVE NEGATIVE  Urine microscopic-add on  Result Value Ref Range   Squamous Epithelial / LPF 0-5 (A) NONE SEEN   WBC, UA NONE SEEN 0 - 5 WBC/hpf   RBC / HPF 6-30 0 - 5 RBC/hpf   Bacteria, UA NONE SEEN NONE SEEN  I-Stat beta hCG blood, ED ([facility], [facility], [facility] only)  Result Value Ref Range   I-stat hCG, quantitative <5.0 <5 mIU/mL   Comment 3           US Transvaginal Non-ob  02/08/2015  CLINICAL DATA:  One-day history of left  lower quadrant pain. EXAM: TRANSABDOMINAL AND TRANSVAGINAL ULTRASOUND OF PELVIS DOPPLER ULTRASOUND OF OVARIES TECHNIQUE: Both transabdominal and transvaginal ultrasound examinations of the pelvis were performed. Transabdominal technique was performed for global imaging of the pelvis including uterus, ovaries, adnexal regions, and pelvic cul-de-sac. It was necessary to proceed with endovaginal exam following the transabdominal exam to visualize the ovaries. Color and duplex Doppler ultrasound was utilized to evaluate blood flow to the ovaries. COMPARISON:  None. FINDINGS: Uterus Measurements: 9.1 x 4.4 x 4.5 cm. 8 mm intramural posterior fibroid evident. Endometrium Thickness: 5 mm.  No focal abnormality visualized. Right ovary Measurements: 2.7 x 1.6 x 2.0 cm. Normal appearance/no adnexal mass. Left ovary Measurements: 3.5 x 1.5 x 1.9 cm. Normal appearance/no adnexal mass. Pulsed Doppler evaluation of both ovaries demonstrates normal low-resistance arterial and venous waveforms. Other findings No abnormal free fluid. IMPRESSION: Small uterine fibroid.  Otherwise normal exam. Electronically Signed   By: Misty Stanley M.D.   On: 02/08/2015 16:55   US Pelvis  Complete  02/08/2015  CLINICAL DATA:  One-day history of left lower quadrant pain. EXAM: TRANSABDOMINAL AND TRANSVAGINAL ULTRASOUND OF PELVIS DOPPLER ULTRASOUND OF OVARIES TECHNIQUE: Both transabdominal and transvaginal ultrasound examinations of the pelvis were performed. Transabdominal technique was performed for global imaging of the pelvis including uterus, ovaries, adnexal regions, and pelvic cul-de-sac. It was necessary to proceed with endovaginal exam following the transabdominal exam to visualize the ovaries. Color and duplex Doppler ultrasound was utilized to evaluate blood flow to the ovaries. COMPARISON:  None. FINDINGS: Uterus Measurements: 9.1 x 4.4 x 4.5 cm. 8 mm intramural posterior fibroid evident. Endometrium Thickness: 5 mm.  No focal abnormality visualized. Right ovary Measurements: 2.7 x 1.6 x 2.0 cm. Normal appearance/no adnexal mass. Left ovary Measurements: 3.5 x 1.5 x 1.9 cm. Normal appearance/no adnexal mass. Pulsed Doppler evaluation of both ovaries demonstrates normal low-resistance arterial and venous waveforms. Other findings No abnormal free fluid. IMPRESSION: Small uterine fibroid.  Otherwise normal exam. Electronically Signed   By: Misty Stanley M.D.   On: 02/08/2015 16:55   Ct Abdomen Pelvis W Contrast  02/08/2015  CLINICAL DATA:  35 year old female with abdominal and pelvic pain for 2 weeks. EXAM: CT ABDOMEN AND PELVIS WITH CONTRAST TECHNIQUE: Multidetector CT imaging of the abdomen and pelvis was performed using the standard protocol following bolus administration of intravenous contrast. CONTRAST:  158mL OMNIPAQUE IOHEXOL 300 MG/ML  SOLN COMPARISON:  02/12/2013 CT FINDINGS: Lower chest:  Unremarkable Hepatobiliary: The liver and gallbladder are unremarkable. There is no evidence of biliary dilatation. Pancreas: Unremarkable Spleen: Unremarkable Adrenals/Urinary Tract: The kidneys, adrenal glands and bladder are unremarkable. Stomach/Bowel: Unremarkable. No evidence of bowel  obstruction or focal bowel wall thickening. The appendix is normal. Vascular/Lymphatic: No enlarged lymph nodes or abdominal aortic aneurysm. Reproductive: The uterus and adnexal regions are unremarkable. Other: No free fluid, abscess or pneumoperitoneum. Musculoskeletal: No acute or suspicious abnormalities. IMPRESSION: Unremarkable exam.  No acute or significant abnormalities. Electronically Signed   By: Margarette Canada M.D.   On: 02/08/2015 14:10   [place] Art/ven Flow Abd Pelv Doppler  02/08/2015  CLINICAL DATA:  One-day history of left lower quadrant pain. EXAM: TRANSABDOMINAL AND TRANSVAGINAL ULTRASOUND OF PELVIS DOPPLER ULTRASOUND OF OVARIES TECHNIQUE: Both transabdominal and transvaginal ultrasound examinations of the pelvis were performed. Transabdominal technique was performed for global imaging of the pelvis including uterus, ovaries, adnexal regions, and pelvic cul-de-sac. It was necessary to proceed with endovaginal exam following the transabdominal exam to visualize the ovaries. Color and duplex  Doppler ultrasound was utilized to evaluate blood flow to the ovaries. COMPARISON:  None. FINDINGS: Uterus Measurements: 9.1 x 4.4 x 4.5 cm. 8 mm intramural posterior fibroid evident. Endometrium Thickness: 5 mm.  No focal abnormality visualized. Right ovary Measurements: 2.7 x 1.6 x 2.0 cm. Normal appearance/no adnexal mass. Left ovary Measurements: 3.5 x 1.5 x 1.9 cm. Normal appearance/no adnexal mass. Pulsed Doppler evaluation of both ovaries demonstrates normal low-resistance arterial and venous waveforms. Other findings No abnormal free fluid. IMPRESSION: Small uterine fibroid.  Otherwise normal exam. Electronically Signed   By: Misty Stanley M.D.   On: 02/08/2015 16:55     Meds ordered this encounter  Medications  . sodium chloride 0.9 % bolus 1,000 mL    Sig:   . iohexol (OMNIPAQUE) 300 MG/ML solution 100 mL    Sig:   . fentaNYL (SUBLIMAZE) injection 50 mcg    Sig:   . diphenhydrAMINE (BENADRYL) 50  MG/ML injection    Sig:     King, Chrislyn   : cabinet override  . diphenhydrAMINE (BENADRYL) injection 25 mg    Sig:   . HYDROcodone-acetaminophen (NORCO/VICODIN) 5-325 MG tablet    Sig: Take 1 tablet by mouth every 6 (six) hours as needed for moderate pain.    Dispense:  15 tablet    Refill:  0    Order Specific Question:  Supervising Provider    Answer:  MILLER, BRIAN [[id number]]  . ibuprofen (ADVIL,MOTRIN) 800 MG tablet    Sig: Take 1 tablet (800 mg total) by mouth every 8 (eight) hours as needed.    Dispense:  21 tablet    Refill:  0    Order Specific Question:  Supervising Provider    Answer:  Noemi Chapel [[id number]]     MDM:   ICD-9-CM ICD-10-CM   1. Left lower quadrant pain 789.04 R10.32   2. Pelvic pain in female 625.9 R10.2 US Transvaginal Non-OB     US Transvaginal Non-OB  3. Uterine leiomyoma, unspecified location 218.9 D25.9    5:12 PM U/S reveals small intramural uterine fibroid which could explain her symptoms. Pain resolved, nausea resolved. Plan to d/c home with norco/ibuprofen and have her f/up with OBGYN in 3-4 days for ongoing management of symptoms. I explained the diagnosis and have given explicit precautions to return to the ER including for any other new or worsening symptoms. The patient understands and accepts the medical plan as it's been dictated and I have answered their questions. Discharge instructions concerning home care and prescriptions have been given. The patient is STABLE and is discharged to home in good condition.   BP 114/74 mmHg  Pulse 98  Temp(Src) 98 F (36.7 C) (Oral)  Resp 18  Ht 5\' 4"  (1.626 m)  Wt 58.968 kg  BMI 22.30 kg/m2  SpO2 100%  LMP 02/08/2015    Champayne Kocian Camprubi-Soms, PA-C 02/08/15 [place], MD 02/08/15 1723

## 2015-03-06 DIAGNOSIS — N920 Excessive and frequent menstruation with regular cycle: Secondary | ICD-10-CM | POA: Diagnosis not present

## 2015-05-04 ENCOUNTER — Ambulatory Visit (INDEPENDENT_AMBULATORY_CARE_PROVIDER_SITE_OTHER): Payer: 59 | Admitting: Family Medicine

## 2015-05-04 ENCOUNTER — Encounter: Payer: Self-pay | Admitting: Family Medicine

## 2015-05-04 VITALS — BP 112/62 | HR 66 | Temp 97.9°F | Wt 127.5 lb

## 2015-05-04 DIAGNOSIS — J020 Streptococcal pharyngitis: Secondary | ICD-10-CM

## 2015-05-04 MED ORDER — AMOXICILLIN-POT CLAVULANATE 875-125 MG PO TABS: 1.0000 | ORAL_TABLET | Freq: Two times a day (BID) | ORAL | Status: AC

## 2015-05-04 NOTE — Progress Notes (Signed)
Pre visit review using our clinic review tool, if applicable. No additional management support is needed unless otherwise documented below in the visit note. 

## 2015-05-04 NOTE — Progress Notes (Signed)
SUBJECTIVE: 35 y.o. female with sore throat, myalgias, swollen glands, headache  for 10 days. No history of rheumatic fever. Other symptoms: swollen glands.  Son had strep throat two weeks ago.  Current Outpatient Prescriptions on File Prior to Visit  Medication Sig Dispense Refill  . Multiple Vitamin (MULTIVITAMIN) tablet Take 1 tablet by mouth daily.     No current facility-administered medications on file prior to visit.    No Known Allergies  Past Medical History  Diagnosis Date  . Abnormal Pap smear     in past; normal colpo  . Allergic rhinitis   . Allergy     Past Surgical History  Procedure Laterality Date  . Colposcopy  2009  . Bunionectomy Right 2013  . Breast biopsy Left 06-12-14    fibroadenoma    Family History  Problem Relation Age of Onset  . Pancreatic cancer Maternal Aunt   . Breast cancer Maternal Aunt   . Colon cancer Maternal Grandmother 103  . Breast cancer Maternal Grandmother   . Breast cancer      GP  . Lung cancer      GP  . Diabetes Paternal Uncle     Social History   Social History  . Marital Status: Married    Spouse Name: N/A  . Number of Children: 1  . Years of Education: N/A   Occupational History  . Student    Social History Main Topics  . Smoking status: Never Smoker   . Smokeless tobacco: Never Used  . Alcohol Use: 0.0 oz/week    0 Standard drinks or equivalent per week     Comment: Rare  . Drug Use: No  . Sexual Activity: Yes    Birth Control/ Protection: None   Other Topics Concern  . Not on file   Social History Narrative   Married      [name]; 31 y/o son      S[employer]; [employer] program      Regular exercise         The PMH, PSH, Social History, Family History, Medications, and allergies have been reviewed in [facility], and have been updated if relevant.  OBJECTIVE:  BP 112/62 mmHg  Pulse 66  Temp(Src) 97.9 F (36.6 C) (Oral)  Wt 127 lb 8 oz (57.834 kg)  SpO2 99%  LMP 04/06/2015  Vitals as noted above. Appears  alert, well appearing, and in no distress and normal appearing weight. Ears: bilateral TM's and external ear canals normal Oropharynx: erythematous and tonsils hypertrophied with exudate Neck: supple, no significant adenopathy Lungs: clear to auscultation, no wheezes, rales or rhonchi, symmetric air entry Rapid Strep test is negative  ASSESSMENT: Streptococcal pharyngitis  PLAN: Per orders- treat with 10 day course of Augmentin given physical exam and history. Gargle, use acetaminophen or other OTC analgesic, and take Rx fully as prescribed. Call if other family members develop similar symptoms. See prn.

## 2015-06-23 ENCOUNTER — Encounter: Payer: Self-pay | Admitting: Physician Assistant

## 2015-06-23 ENCOUNTER — Other Ambulatory Visit
Admission: RE | Admit: 2015-06-23 | Discharge: 2015-06-23 | Disposition: A | Discharge status: Home / Self Care | Payer: 59 | Source: Ambulatory Visit | Attending: Physician Assistant | Admitting: Physician Assistant

## 2015-06-23 ENCOUNTER — Ambulatory Visit: Payer: Self-pay | Admitting: Physician Assistant

## 2015-06-23 VITALS — BP 110/70 | Temp 98.4°F

## 2015-06-23 DIAGNOSIS — R110 Nausea: Secondary | ICD-10-CM

## 2015-06-23 DIAGNOSIS — R112 Nausea with vomiting, unspecified: Secondary | ICD-10-CM | POA: Insufficient documentation

## 2015-06-23 DIAGNOSIS — W57XXXA Bitten or stung by nonvenomous insect and other nonvenomous arthropods, initial encounter: Secondary | ICD-10-CM

## 2015-06-23 LAB — POCT URINE PREGNANCY: Preg Test, Ur: NEGATIVE

## 2015-06-23 LAB — HCG, QUANTITATIVE, PREGNANCY: hCG, Beta Chain, Quant, S: <1 m[IU]/mL (ref <5)

## 2015-06-23 MED ORDER — DOXYCYCLINE HYCLATE 100 MG PO TABS: 100.0000 mg | ORAL_TABLET | Freq: Two times a day (BID) | ORAL | Status: DC

## 2015-06-23 MED ORDER — ONDANSETRON HCL 4 MG PO TABS: 4.0000 mg | ORAL_TABLET | Freq: Three times a day (TID) | ORAL | Status: DC | PRN

## 2015-06-23 NOTE — Addendum Note (Signed)
Addended by: Versie Starks on: 06/23/2015 01:44 PM   Modules accepted: Orders

## 2015-06-23 NOTE — Progress Notes (Signed)
Beta hcg is less than 1 so ok to go on doxy as is not pregnant

## 2015-06-23 NOTE — Progress Notes (Signed)
Contacted patient informed her that lab result was normal can pick up doxy and start taking.

## 2015-06-23 NOTE — Progress Notes (Signed)
S: c/o nausea, headache, chills for 2 days, no diarrhea, had multiple ticks on her about 2 weeks ago, lmp was 4/26 but is not on bc; others in house are well, no abdominal pain, just doesn't want to look at food  O: vitals wnl, nad, lungs c t a, cv rrr, abd soft nontender bs normal, urine preg neg  A: nausea, headache, tick bite  P: beta hcg as pt would be less than 9 days preg, if normal will call in doxy, rx for zofran 4mg 

## 2015-06-24 NOTE — Progress Notes (Signed)
Spoke to patient yesterday informed her that HCG was negative ok to start antibiotic

## 2015-06-29 ENCOUNTER — Ambulatory Visit (INDEPENDENT_AMBULATORY_CARE_PROVIDER_SITE_OTHER): Payer: 59 | Admitting: General Surgery

## 2015-06-29 VITALS — BP 110/74 | HR 66 | Resp 12 | Ht 64.0 in | Wt 131.8 lb

## 2015-06-29 DIAGNOSIS — R1013 Epigastric pain: Secondary | ICD-10-CM

## 2015-06-29 MED ORDER — PANTOPRAZOLE SODIUM 40 MG PO TBEC: 40.0000 mg | DELAYED_RELEASE_TABLET | Freq: Every day | ORAL | Status: DC

## 2015-06-29 NOTE — Progress Notes (Signed)
Patient ID: Marissa Salazar, female   DOB: 1980/08/10, 35 y.o.   MRN: [id number]  Chief Complaint  Patient presents with  . Abdominal Pain    HPI Marissa Salazar is a 35 y.o. female here today for evaluation on going nausea and left upper burning abdominal pain. Started 1 week ago. Urinating, moving bowels regularly.  She did have a pregnancy test both blood and urine-negative. She has tried Zofran and Tums and 2 doses of Prilosec-no relief. I have reviewed the history of present illness with the patient. HPI  Past Medical History  Diagnosis Date  . Abnormal Pap smear     in past; normal colpo  . Allergic rhinitis   . Allergy     Past Surgical History  Procedure Laterality Date  . Colposcopy  2009  . Bunionectomy Right 2013  . Breast biopsy Left 06-12-14    fibroadenoma    Family History  Problem Relation Age of Onset  . Pancreatic cancer Maternal Aunt   . Breast cancer Maternal Aunt   . Colon cancer Maternal Grandmother 42  . Breast cancer Maternal Grandmother   . Breast cancer      GP  . Lung cancer      GP  . Diabetes Paternal Uncle     Social History Social History  Substance Use Topics  . Smoking status: Never Smoker   . Smokeless tobacco: Never Used  . Alcohol Use: 0.0 oz/week    0 Standard drinks or equivalent per week     Comment: Rare    No Known Allergies  Current Outpatient Prescriptions  Medication Sig Dispense Refill  . Multiple Vitamin (MULTIVITAMIN) tablet Take 1 tablet by mouth daily.    . pantoprazole (PROTONIX) 40 MG tablet Take 1 tablet (40 mg total) by mouth daily. 30 tablet 0   No current facility-administered medications for this visit.    Review of Systems Review of Systems  Constitutional: Negative.   Respiratory: Negative.   Cardiovascular: Negative.   Gastrointestinal: Positive for nausea and abdominal pain.    Blood pressure 110/74, pulse 66, resp. rate 12, height 5\' 4"  (1.626 m), weight 131 lb 12.8 oz (59.784 kg), last  menstrual period 06/02/2015.  Physical Exam Physical Exam  Constitutional: She is oriented to person, place, and time. She appears well-developed and well-nourished.  Eyes: Conjunctivae are normal. No scleral icterus.  Neck: Neck supple.  Cardiovascular: Normal rate, regular rhythm and normal heart sounds.   Pulmonary/Chest: Effort normal and breath sounds normal.  Abdominal: Soft. Bowel sounds are normal. There is no hepatomegaly. There is tenderness (mild tenderness in luq and epigastric  area) in the epigastric area and left upper quadrant. No hernia.  Neurological: She is alert and oriented to person, place, and time.  Skin: Skin is warm and dry.    Data Reviewed US abdomen 1 yr ago was normal.  Assessment    Epigastric ans luq abd pain with nausea. Likely gastritis.      Plan    Check CBC, hepatic function panel, lipase. Trial of Protonix 40 mg daily     This information was scribed by Darrin Nipper, CMA   PCP: Dr. Levander Campion 06/29/2015, 12:43 PM

## 2015-06-30 ENCOUNTER — Other Ambulatory Visit: Payer: Self-pay

## 2015-06-30 ENCOUNTER — Ambulatory Visit
Admission: RE | Admit: 2015-06-30 | Discharge: 2015-06-30 | Disposition: A | Discharge status: Home / Self Care | Payer: 59 | Source: Ambulatory Visit | Attending: General Surgery | Admitting: General Surgery

## 2015-06-30 DIAGNOSIS — R1010 Upper abdominal pain, unspecified: Secondary | ICD-10-CM | POA: Diagnosis not present

## 2015-06-30 DIAGNOSIS — K858 Other acute pancreatitis without necrosis or infection: Secondary | ICD-10-CM | POA: Diagnosis not present

## 2015-06-30 DIAGNOSIS — R948 Abnormal results of function studies of other organs and systems: Secondary | ICD-10-CM | POA: Diagnosis not present

## 2015-06-30 DIAGNOSIS — G8929 Other chronic pain: Secondary | ICD-10-CM

## 2015-06-30 DIAGNOSIS — R932 Abnormal findings on diagnostic imaging of liver and biliary tract: Secondary | ICD-10-CM | POA: Insufficient documentation

## 2015-06-30 DIAGNOSIS — R1011 Right upper quadrant pain: Secondary | ICD-10-CM

## 2015-06-30 DIAGNOSIS — R1013 Epigastric pain: Secondary | ICD-10-CM

## 2015-06-30 DIAGNOSIS — R109 Unspecified abdominal pain: Secondary | ICD-10-CM | POA: Diagnosis not present

## 2015-06-30 LAB — CBC WITH DIFFERENTIAL/PLATELET
BASOS ABS: 0 10*3/uL (ref 0.0–0.2)
Basos: 0 %
EOS (ABSOLUTE): 0.1 10*3/uL (ref 0.0–0.4)
Eos: 2 %
HEMOGLOBIN: 13 g/dL (ref 11.1–15.9)
Hematocrit: 39.2 % (ref 34.0–46.6)
IMMATURE GRANS (ABS): 0 10*3/uL (ref 0.0–0.1)
Immature Granulocytes: 0 %
LYMPHS: 33 %
Lymphocytes Absolute: 2.3 10*3/uL (ref 0.7–3.1)
MCH: 28.3 pg (ref 26.6–33.0)
MCHC: 33.2 g/dL (ref 31.5–35.7)
MCV: 85 fL (ref 79–97)
MONOCYTES: 4 %
Monocytes Absolute: 0.3 10*3/uL (ref 0.1–0.9)
NEUTROS PCT: 61 %
Neutrophils Absolute: 4.3 10*3/uL (ref 1.4–7.0)
Platelets: 231 10*3/uL (ref 150–379)
RBC: 4.59 x10E6/uL (ref 3.77–5.28)
RDW: 13.7 % (ref 12.3–15.4)
WBC: 7 10*3/uL (ref 3.4–10.8)

## 2015-06-30 LAB — HEPATIC FUNCTION PANEL
ALT: 30 IU/L (ref 0–32)
AST: 32 IU/L (ref 0–40)
Albumin: 4.5 g/dL (ref 3.5–5.5)
Alkaline Phosphatase: 42 IU/L (ref 39–117)
Bilirubin Total: 0.3 mg/dL (ref 0.0–1.2)
Bilirubin, Direct: 0.1 mg/dL (ref 0.00–0.40)
Total Protein: 7.3 g/dL (ref 6.0–8.5)

## 2015-06-30 LAB — LIPASE: LIPASE: 127 U/L — AB (ref 0–59)

## 2015-06-30 NOTE — Progress Notes (Signed)
Patient is scheduled for a gallbladder Ultrasound at [facility] for 06/30/15 at 1:30 pm. She is to arrive by 1:00 pm and have nothing to eat or drink until after the exam. Patient is aware of date, time, and instructions.

## 2015-06-30 NOTE — Addendum Note (Signed)
Addended by: Lesly Rubenstein on: 06/30/2015 03:45 PM   Modules accepted: Orders

## 2015-07-01 DIAGNOSIS — R1013 Epigastric pain: Secondary | ICD-10-CM | POA: Diagnosis not present

## 2015-07-01 DIAGNOSIS — R948 Abnormal results of function studies of other organs and systems: Secondary | ICD-10-CM | POA: Diagnosis not present

## 2015-07-02 ENCOUNTER — Encounter: Payer: Self-pay | Admitting: General Surgery

## 2015-07-02 ENCOUNTER — Ambulatory Visit: Payer: Self-pay

## 2015-07-02 ENCOUNTER — Ambulatory Visit (INDEPENDENT_AMBULATORY_CARE_PROVIDER_SITE_OTHER): Payer: 59 | Admitting: General Surgery

## 2015-07-02 VITALS — BP 112/70 | HR 86 | Resp 12 | Ht 64.0 in | Wt 129.6 lb

## 2015-07-02 DIAGNOSIS — N63 Unspecified lump in breast: Secondary | ICD-10-CM

## 2015-07-02 DIAGNOSIS — N6320 Unspecified lump in the left breast, unspecified quadrant: Secondary | ICD-10-CM

## 2015-07-02 DIAGNOSIS — N6002 Solitary cyst of left breast: Secondary | ICD-10-CM

## 2015-07-02 NOTE — Progress Notes (Signed)
Patient ID: Marissa Salazar, female   DOB: Nov 21, 1980, 35 y.o.   MRN: [id number]  Chief Complaint  Patient presents with  . Breast Problem    tendernes left breast    HPI Marissa Salazar is a 35 y.o. female.  Here today for evaluation of left breast tenderness for 1-2 weeks, known history of left breast cyst. She is also currently being treated for presumed gastritis. I have reviewed the history of present illness with the patient.  HPI  Past Medical History  Diagnosis Date  . Abnormal Pap smear     in past; normal colpo  . Allergic rhinitis   . Allergy     Past Surgical History  Procedure Laterality Date  . Colposcopy  2009  . Bunionectomy Right 2013  . Breast biopsy Left 06-12-14    fibroadenoma    Family History  Problem Relation Age of Onset  . Pancreatic cancer Maternal Aunt   . Breast cancer Maternal Aunt   . Colon cancer Maternal Grandmother 39  . Breast cancer Maternal Grandmother   . Breast cancer      GP  . Lung cancer      GP  . Diabetes Paternal Uncle     Social History Social History  Substance Use Topics  . Smoking status: Never Smoker   . Smokeless tobacco: Never Used  . Alcohol Use: 0.0 oz/week    0 Standard drinks or equivalent per week     Comment: Rare    No Known Allergies  Current Outpatient Prescriptions  Medication Sig Dispense Refill  . Multiple Vitamin (MULTIVITAMIN) tablet Take 1 tablet by mouth daily.    . pantoprazole (PROTONIX) 40 MG tablet Take 1 tablet (40 mg total) by mouth daily. 30 tablet 0  . sucralfate (CARAFATE) 1 g tablet Take 1 tablet (1 g total) by mouth 4 (four) times daily -  with meals and at bedtime. 30 tablet 1   No current facility-administered medications for this visit.    Review of Systems Review of Systems  Constitutional: Negative.   Respiratory: Negative.   Cardiovascular: Negative.     Blood pressure 112/70, pulse 86, resp. rate 12, height 5\' 4"  (1.626 m), weight 129 lb 9.6 oz (58.786 kg), last  menstrual period 07/01/2015.  Physical Exam Physical Exam  Constitutional: She is oriented to person, place, and time. She appears well-developed and well-nourished.  Pulmonary/Chest: Right breast exhibits no inverted nipple, no mass, no nipple discharge, no skin change and no tenderness. Left breast exhibits tenderness. Left breast exhibits no inverted nipple, no mass, no nipple discharge and no skin change.    Lymphadenopathy:    She has no cervical adenopathy.    She has no axillary adenopathy.  Neurological: She is alert and oriented to person, place, and time.  Skin: Skin is warm.  Psychiatric: Her behavior is normal.    Data Reviewed Prior notes and ultrasound. [place] today revealed no findings in medial left breast. There is an ill defined mass with what looks like calcifications in lateral aspect Assessment    History breast cyst. Left breast mass, recommend biopsy.    Plan    Follow up as scheduled for left breast core  biopsy.  The patient is aware to call back for any questions or concerns.      PCP:  Lucille Passy This information has been scribed by Karie Fetch RN, BSN,BC.    SANKAR,SEEPLAPUTHUR G 07/05/2015, 10:29 AM

## 2015-07-02 NOTE — Patient Instructions (Signed)
The patient is aware to call back for any questions or concerns.  

## 2015-07-03 ENCOUNTER — Other Ambulatory Visit: Payer: Self-pay | Admitting: General Surgery

## 2015-07-03 DIAGNOSIS — K2990 Gastroduodenitis, unspecified, without bleeding: Principal | ICD-10-CM

## 2015-07-03 DIAGNOSIS — K2970 Gastritis, unspecified, without bleeding: Secondary | ICD-10-CM

## 2015-07-03 LAB — H PYLORI, IGM, IGG, IGA AB
H pylori, IgM Abs: 23.5 units — ABNORMAL HIGH (ref 0.0–8.9)
H. pylori, IgA Abs: <9 units (ref 0.0–8.9)

## 2015-07-03 LAB — LIPASE: Lipase: 53 U/L (ref 0–59)

## 2015-07-03 MED ORDER — SUCRALFATE 1 G PO TABS: 1.0000 g | ORAL_TABLET | Freq: Three times a day (TID) | ORAL | Status: DC

## 2015-07-05 ENCOUNTER — Encounter: Payer: Self-pay | Admitting: General Surgery

## 2015-07-09 ENCOUNTER — Encounter: Payer: Self-pay | Admitting: General Surgery

## 2015-07-09 ENCOUNTER — Other Ambulatory Visit: Payer: Self-pay | Admitting: Physician Assistant

## 2015-07-09 ENCOUNTER — Ambulatory Visit (INDEPENDENT_AMBULATORY_CARE_PROVIDER_SITE_OTHER): Payer: 59 | Admitting: General Surgery

## 2015-07-09 VITALS — BP 98/60 | HR 78 | Temp 97.7°F | Resp 12 | Ht 64.0 in | Wt 131.0 lb

## 2015-07-09 DIAGNOSIS — R1013 Epigastric pain: Secondary | ICD-10-CM

## 2015-07-09 DIAGNOSIS — K219 Gastro-esophageal reflux disease without esophagitis: Secondary | ICD-10-CM

## 2015-07-09 MED ORDER — METOCLOPRAMIDE HCL 5 MG PO TABS: 5.0000 mg | ORAL_TABLET | Freq: Three times a day (TID) | ORAL | Status: DC

## 2015-07-09 NOTE — Progress Notes (Signed)
Patient ID: Marissa Salazar, female   DOB: 11/17/1980, 35 y.o.   MRN: [id number]  Chief Complaint  Patient presents with  . Abdominal Pain    HPI Marissa Salazar is a 35 y.o. female. here today for a evaluation of left upper abdomidal pain as well as a burning sensation in the epigastrium, left upper quadrant and radiating up into the retrosternal area.. Patient states this has been going on for two weeks. The pain starts in the mornings one hour after she eats and in the afternoon after eating a small meal. She reports early satiety. She can drink water without discomfort but any and all foods cause the burning sensation. She has been having a burning pain in her epigastric area. Last week she had a lipase done which showed an evaluated level, and ultrasound done on 06/30/15. This did not show any evidence of gallstones.  The patient was seen in the [facility] clinic on May 16 with nausea and vomiting. There was a history of tick exposure and she is placed on doxycycline at that time. The patient was then seen by Dr. Jamal Collin on May 22 regarding persistent abdominal pain and at that time laboratory and radiologic studies were completed. Last week she had a lipase done which showed an evaluated level, and ultrasound done on 06/30/15. This did not show any evidence of gallstones.  Serologic testing for H. pylori were completed with only the IgM positive. Not suggestive of acute infection. She's been taking a PPI twice a day and recently added Carafate 4 times a day with only transient relief of the burning discomfort.  The patient reports the last 2 nights she's been awakened with night sweats. No associated fever. She reports that her stools are formed but the last 2 days they've been green in color.  The patient had a CT scan of the abdomen and pelvis in January 2017 for some left lower quadrant pain. 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    HPI  Past Medical History   Diagnosis Date  . Abnormal Pap smear     in past; normal colpo  . Allergic rhinitis   . Allergy     Past Surgical History  Procedure Laterality Date  . Colposcopy  2009  . Bunionectomy Right 2013  . Breast biopsy Left 06-12-14    fibroadenoma    Family History  Problem Relation Age of Onset  . Pancreatic cancer Maternal Aunt   . Breast cancer Maternal Aunt   . Colon cancer Maternal Grandmother 81  . Breast cancer Maternal Grandmother   . Breast cancer      GP  . Lung cancer      GP  . Diabetes Paternal Uncle     Social History Social History  Substance Use Topics  . Smoking status: Never Smoker   . Smokeless tobacco: Never Used  . Alcohol Use: 0.0 oz/week    0 Standard drinks or equivalent per week     Comment: Rare    Allergies  Allergen Reactions  . Ivp Dye [Iodinated Diagnostic Agents]     Current Outpatient Prescriptions  Medication Sig Dispense Refill  . Multiple Vitamin (MULTIVITAMIN) tablet Take 1 tablet by mouth daily.    . pantoprazole (PROTONIX) 40 MG tablet Take 1 tablet (40 mg total) by mouth daily. 30 tablet 0  . sucralfate (CARAFATE) 1 g tablet Take 1 tablet (1 g total) by mouth 4 (four) times daily -  with meals  and at bedtime. 30 tablet 1  . metoCLOPramide (REGLAN) 5 MG tablet Take 1 tablet (5 mg total) by mouth 4 (four) times daily -  before meals and at bedtime. 60 tablet 0   No current facility-administered medications for this visit.    Review of Systems Review of Systems  Constitutional: Positive for chills and appetite change.  Respiratory: Negative.   Cardiovascular: Negative.   Gastrointestinal: Positive for nausea and abdominal pain.    Blood pressure 98/60, pulse 78, temperature 97.7 F (36.5 C), temperature source Oral, resp. rate 12, height 5\' 4"  (1.626 m), weight 131 lb (59.421 kg), last menstrual period 07/01/2015, SpO2 98 %.  The patient's weight is down 1 pound from her 06/24/2015 exam.  Physical Exam Physical Exam   Constitutional: She is oriented to person, place, and time. She appears well-developed and well-nourished.  Eyes: Conjunctivae are normal. No scleral icterus.  Neck: Neck supple.  Cardiovascular: Normal rate, regular rhythm and normal heart sounds.   Pulmonary/Chest: Effort normal and breath sounds normal.  Abdominal: Soft. Normal appearance and bowel sounds are normal. There is no hepatosplenomegaly or hepatomegaly. There is tenderness in the left upper quadrant. No hernia.    Lymphadenopathy:    She has no cervical adenopathy.    She has no axillary adenopathy.       Right: No inguinal and no supraclavicular adenopathy present.       Left: No inguinal and no supraclavicular adenopathy present.  Neurological: She is alert and oriented to person, place, and time.  Skin: Skin is dry.    Data Reviewed The January 2017 CT scan was reviewed. The space between the SMA and aorta is only 7 mm. The stomach was somewhat prominent. There are multiple loops of normal diameter small bowel in the left upper quadrant. There is a fairly small intra-abdominal space measuring only 5 cm from the rectus muscle to the spine and 22 cm side to side. She reports when she was pregnant she carried her children "in front". (During pregnancy was easily time she has had similar episodes of heartburn/reflux). Previous laboratory studies were reviewed. Minimal elevation of the serum lipase. Normal CBC. No anemia.  Assessment    Persistent abdominal pain, early satiety, reflux unresponsive to maximal acid suppression.    Plan    We'll arrange for you GI/SBFT to assess gastric emptying and the narrow area in the third portion of the duodenum. After this study is completed she'll make use of Reglan 5 mg 30 minutes before meals and daily at bedtime. She'll continue her present PPI/Carafate therapy.  Further follow-up based on review of her imaging studies.    Patient is scheduled for an Upper GI with small bowel  follow thru at [facility] on 07/10/15 at 12 pm. She is to have nothing to eat or drink after midnight. She will arrive at 11:45 am and report to the radiology desk.   PCP:  Marissa Salazar This information has been scribed by Gaspar Cola CMA.   Robert Bellow 07/09/2015, 12:16 PM

## 2015-07-09 NOTE — Patient Instructions (Signed)
Patient is scheduled for an Upper GI with small bowel follow thru at [facility] on 07/10/15 at 12 pm. She is to have nothing to eat or drink after midnight. She will arrive at 11:45 am and report to the radiology desk.

## 2015-07-10 ENCOUNTER — Ambulatory Visit
Admission: RE | Admit: 2015-07-10 | Discharge: 2015-07-10 | Disposition: A | Discharge status: Home / Self Care | Payer: 59 | Source: Ambulatory Visit | Attending: General Surgery | Admitting: General Surgery

## 2015-07-10 DIAGNOSIS — K219 Gastro-esophageal reflux disease without esophagitis: Secondary | ICD-10-CM | POA: Diagnosis not present

## 2015-07-10 DIAGNOSIS — R1310 Dysphagia, unspecified: Secondary | ICD-10-CM | POA: Diagnosis not present

## 2015-07-10 DIAGNOSIS — R1013 Epigastric pain: Secondary | ICD-10-CM

## 2015-07-14 ENCOUNTER — Ambulatory Visit (INDEPENDENT_AMBULATORY_CARE_PROVIDER_SITE_OTHER): Payer: 59 | Admitting: General Surgery

## 2015-07-14 ENCOUNTER — Encounter: Payer: Self-pay | Admitting: General Surgery

## 2015-07-14 ENCOUNTER — Ambulatory Visit: Payer: Self-pay

## 2015-07-14 VITALS — BP 118/70 | HR 72 | Resp 12 | Ht 64.0 in | Wt 130.0 lb

## 2015-07-14 DIAGNOSIS — R110 Nausea: Secondary | ICD-10-CM

## 2015-07-14 DIAGNOSIS — D242 Benign neoplasm of left breast: Secondary | ICD-10-CM | POA: Diagnosis not present

## 2015-07-14 DIAGNOSIS — R1013 Epigastric pain: Secondary | ICD-10-CM

## 2015-07-14 DIAGNOSIS — N63 Unspecified lump in breast: Secondary | ICD-10-CM

## 2015-07-14 DIAGNOSIS — N6320 Unspecified lump in the left breast, unspecified quadrant: Secondary | ICD-10-CM

## 2015-07-14 NOTE — Progress Notes (Signed)
This is a 35 year old female here today for scheduled  left breast core biopsy.  She continues to have epigastric pain and in my abscence she had UGI which was normal. Currently using Protonix, reglan and carafate.Prior CT from Jan of this yr was non revealing. Plan for repeat CT. If normal will plan for upper endoscopy. Discussed fully with pt.  Left breast she had a core biopsy of a solid mass 2 ocl location last yr. This was reported to be 2cm from nipple but on [place] last week was noted to be 5-7cm fn. Core biopsy was repeated today and a clip placed. The mass is 1.65 cm in max size.     Patient is scheduled for a CT abdomen/pelvis oral contrast only at [place] on 07/16/15 at 4:30 pm. She will need to pick up a prep kit. She will have nothing to eat or drink for 4 hours prior and will arrive at 4:15 pm. Patient is aware of date, time, and instructions.   PCP:  Lucille Passy This information has been scribed by Gaspar Cola CMA.

## 2015-07-14 NOTE — Patient Instructions (Addendum)
CARE AFTER BREAST BIOPSY  1. Leave the dressing on that your doctor applied after the biopsy. It is waterproof. You may bathe, shower and/or swim. The dressing can be removed in 3 days, you will see small strips of tape against your skin on the incision. Do not remove these strips they will gradually fall off in about 2-3 weeks. You may use an ice pack on and off for the first 12-24 hours for comfort.  2. You may want to use a gauze,cloth or similar protection in your bra to prevent rubbing against your dressing and incision. This is not necessary, but you may feel more comfortable doing so.  3. It is recommended that you wear a bra day and night to give support to the breast. This will prevent the weight of the breast from pulling on the incision.  4. Your breast may feel hard and lumpy under the incision. Do not be alarmed. This is the underlying stitching of tissue. Softening of this tissue will occur in time.  5. You may have a follow up appointment or phone follow up in one week after your biopsy. The office phone number is [phone].  6. You will notice about a week or two after your office visit that the strips of the tape on your incision will begin to loosen. These may then be removed.  7. Report to your doctor any of the following:  * Severe pain not relieved by your pain medication  *Redness of the incision  * Drainage from the incision  *Fever greater than 101 degrees  Patient is scheduled for a CT abdomen/pelvis oral contrast only at [place] on 07/16/15 at 4:30 pm. She will need to pick up a prep kit. She will have nothing to eat or drink for 4 hours prior and will arrive at 4:15 pm. Patient is aware of date, time, and instructions.

## 2015-07-15 ENCOUNTER — Observation Stay: Payer: 59

## 2015-07-15 ENCOUNTER — Encounter: Payer: Self-pay | Admitting: *Deleted

## 2015-07-15 ENCOUNTER — Ambulatory Visit: Payer: Self-pay | Admitting: Family Medicine

## 2015-07-15 ENCOUNTER — Observation Stay
Admission: EM | Admit: 2015-07-15 | Discharge: 2015-07-17 | Disposition: A | Discharge status: Home / Self Care | Payer: 59 | Attending: General Surgery | Admitting: General Surgery

## 2015-07-15 DIAGNOSIS — D72829 Elevated white blood cell count, unspecified: Secondary | ICD-10-CM | POA: Insufficient documentation

## 2015-07-15 DIAGNOSIS — Z801 Family history of malignant neoplasm of trachea, bronchus and lung: Secondary | ICD-10-CM | POA: Diagnosis not present

## 2015-07-15 DIAGNOSIS — A048 Other specified bacterial intestinal infections: Secondary | ICD-10-CM | POA: Insufficient documentation

## 2015-07-15 DIAGNOSIS — K2970 Gastritis, unspecified, without bleeding: Secondary | ICD-10-CM | POA: Diagnosis not present

## 2015-07-15 DIAGNOSIS — Z91041 Radiographic dye allergy status: Secondary | ICD-10-CM | POA: Insufficient documentation

## 2015-07-15 DIAGNOSIS — J309 Allergic rhinitis, unspecified: Secondary | ICD-10-CM | POA: Insufficient documentation

## 2015-07-15 DIAGNOSIS — Z833 Family history of diabetes mellitus: Secondary | ICD-10-CM | POA: Diagnosis not present

## 2015-07-15 DIAGNOSIS — R1012 Left upper quadrant pain: Secondary | ICD-10-CM | POA: Diagnosis not present

## 2015-07-15 DIAGNOSIS — Z800 Family history of malignant neoplasm of digestive organs: Secondary | ICD-10-CM | POA: Insufficient documentation

## 2015-07-15 DIAGNOSIS — M545 Low back pain: Secondary | ICD-10-CM | POA: Diagnosis not present

## 2015-07-15 DIAGNOSIS — R110 Nausea: Secondary | ICD-10-CM

## 2015-07-15 DIAGNOSIS — K219 Gastro-esophageal reflux disease without esophagitis: Secondary | ICD-10-CM | POA: Insufficient documentation

## 2015-07-15 DIAGNOSIS — R1013 Epigastric pain: Secondary | ICD-10-CM

## 2015-07-15 LAB — COMPREHENSIVE METABOLIC PANEL
ALBUMIN: 4.4 g/dL (ref 3.5–5.0)
ALT: 12 U/L — ABNORMAL LOW (ref 14–54)
ANION GAP: 6 (ref 5–15)
AST: 20 U/L (ref 15–41)
Alkaline Phosphatase: 37 U/L — ABNORMAL LOW (ref 38–126)
BUN: 11 mg/dL (ref 6–20)
CHLORIDE: 104 mmol/L (ref 101–111)
CO2: 26 mmol/L (ref 22–32)
Calcium: 9 mg/dL (ref 8.9–10.3)
Creatinine, Ser: 0.69 mg/dL (ref 0.44–1.00)
GFR calc Af Amer: >60 mL/min (ref >60)
GFR calc non Af Amer: >60 mL/min (ref >60)
GLUCOSE: 102 mg/dL — AB (ref 65–99)
POTASSIUM: 3.8 mmol/L (ref 3.5–5.1)
SODIUM: 136 mmol/L (ref 135–145)
TOTAL PROTEIN: 7.3 g/dL (ref 6.5–8.1)

## 2015-07-15 LAB — CBC
HEMATOCRIT: 37.2 % (ref 35.0–47.0)
HEMOGLOBIN: 12.6 g/dL (ref 12.0–16.0)
MCH: 28.4 pg (ref 26.0–34.0)
MCHC: 33.8 g/dL (ref 32.0–36.0)
MCV: 84 fL (ref 80.0–100.0)
Platelets: 228 10*3/uL (ref 150–440)
RBC: 4.42 MIL/uL (ref 3.80–5.20)
RDW: 14 % (ref 11.5–14.5)
WBC: 11.8 10*3/uL — ABNORMAL HIGH (ref 3.6–11.0)

## 2015-07-15 LAB — URINALYSIS COMPLETE WITH MICROSCOPIC (ARMC ONLY)
BACTERIA UA: NONE SEEN
Bilirubin Urine: NEGATIVE
GLUCOSE, UA: NEGATIVE mg/dL
HGB URINE DIPSTICK: NEGATIVE
Ketones, ur: NEGATIVE mg/dL
Leukocytes, UA: NEGATIVE
NITRITE: NEGATIVE
PH: 7 (ref 5.0–8.0)
Protein, ur: NEGATIVE mg/dL
SPECIFIC GRAVITY, URINE: 1.009 (ref 1.005–1.030)

## 2015-07-15 LAB — POCT PREGNANCY, URINE: PREG TEST UR: NEGATIVE

## 2015-07-15 LAB — LIPASE, BLOOD: Lipase: 35 U/L (ref 11–51)

## 2015-07-15 MED ORDER — PANTOPRAZOLE SODIUM 40 MG PO TBEC
40.0000 mg | DELAYED_RELEASE_TABLET | Freq: Two times a day (BID) | ORAL | Status: DC
  Administered 2015-07-16: 40 mg via ORAL
  Filled 2015-07-15 (×2): qty 1

## 2015-07-15 MED ORDER — ACETAMINOPHEN 650 MG RE SUPP: 650.0000 mg | Freq: Four times a day (QID) | RECTAL | Status: DC | PRN

## 2015-07-15 MED ORDER — ONDANSETRON HCL 4 MG/2ML IJ SOLN: 4.0000 mg | Freq: Four times a day (QID) | INTRAMUSCULAR | Status: DC | PRN

## 2015-07-15 MED ORDER — DEXTROSE-NACL 5-0.45 % IV SOLN
INTRAVENOUS | Status: DC
  Administered 2015-07-15 – 2015-07-17 (×3): via INTRAVENOUS

## 2015-07-15 MED ORDER — ACETAMINOPHEN 325 MG PO TABS
650.0000 mg | ORAL_TABLET | Freq: Four times a day (QID) | ORAL | Status: DC | PRN
  Administered 2015-07-15: 650 mg via ORAL

## 2015-07-15 MED ORDER — ALPRAZOLAM 0.25 MG PO TABS
0.2500 mg | ORAL_TABLET | Freq: Two times a day (BID) | ORAL | Status: DC | PRN
  Administered 2015-07-15 – 2015-07-17 (×3): 0.25 mg via ORAL
  Filled 2015-07-15 (×3): qty 1

## 2015-07-15 MED ORDER — DIPHENHYDRAMINE HCL 25 MG PO CAPS
50.0000 mg | ORAL_CAPSULE | Freq: Once | ORAL | Status: AC
  Administered 2015-07-15: 50 mg via ORAL
  Filled 2015-07-15: qty 2

## 2015-07-15 MED ORDER — BARIUM SULFATE 2.1 % PO SUSP
450.0000 mL | ORAL | Status: AC
Start: 2015-07-15 — End: 2015-07-15
  Administered 2015-07-15: 450 mL via ORAL
  Filled 2015-07-15 (×2): qty 450

## 2015-07-15 MED ORDER — OXYCODONE HCL 5 MG PO TABS
5.0000 mg | ORAL_TABLET | ORAL | Status: DC | PRN
  Administered 2015-07-15 – 2015-07-16 (×4): 5 mg via ORAL
  Filled 2015-07-15 (×4): qty 1

## 2015-07-15 MED ORDER — ONDANSETRON 8 MG PO TBDP: 4.0000 mg | ORAL_TABLET | Freq: Four times a day (QID) | ORAL | Status: DC | PRN

## 2015-07-15 MED ORDER — ACETAMINOPHEN 325 MG PO TABS
ORAL_TABLET | ORAL | Status: AC
  Administered 2015-07-15: 650 mg via ORAL
  Filled 2015-07-15: qty 2

## 2015-07-15 MED ORDER — MORPHINE SULFATE (PF) 2 MG/ML IV SOLN
2.0000 mg | INTRAVENOUS | Status: DC | PRN
  Administered 2015-07-15 – 2015-07-17 (×4): 2 mg via INTRAVENOUS
  Filled 2015-07-15 (×4): qty 1

## 2015-07-15 NOTE — H&P (Signed)
Marissa Salazar is an 35 y.o. female.   Chief Complaint:nausea,epigastric pain  HPI: 35 yr old female with 3-4 week history of epigastric burning discomfort, post prandial nausea. Has had no relief with Protonix, carafate and reglan. [place] and labs were normal. H-pylori IGm antibody was pos but antigen negative UGI was normal.  Continues to have severe nausea 1hr after eating every time. Feels constant burning in epigastric and luq area. Past Medical History  Diagnosis Date  . Abnormal Pap smear     in past; normal colpo  . Allergic rhinitis   . Allergy     Past Surgical History  Procedure Laterality Date  . Colposcopy  2009  . Bunionectomy Right 2013  . Breast biopsy Left 06-12-14    fibroadenoma    Family History  Problem Relation Age of Onset  . Pancreatic cancer Maternal Aunt   . Breast cancer Maternal Aunt   . Colon cancer Maternal Grandmother 65  . Breast cancer Maternal Grandmother   . Breast cancer      GP  . Lung cancer      GP  . Diabetes Paternal Uncle    Social History:  reports that she has never smoked. She has never used smokeless tobacco. She reports that she drinks alcohol. She reports that she does not use illicit drugs.  Allergies:  Allergies  Allergen Reactions  . Ivp Dye [Iodinated Diagnostic Agents] Hives     (Not in a hospital admission)  Results for orders placed or performed during the hospital encounter of 07/15/15 (from the past 48 hour(s))  Lipase, blood     Status: None   Collection Time: 07/15/15 12:04 PM  Result Value Ref Range   Lipase 35 11 - 51 U/L  Comprehensive metabolic panel     Status: Abnormal   Collection Time: 07/15/15 12:04 PM  Result Value Ref Range   Sodium 136 135 - 145 mmol/L   Potassium 3.8 3.5 - 5.1 mmol/L   Chloride 104 101 - 111 mmol/L   CO2 26 22 - 32 mmol/L   Glucose, Bld 102 (H) 65 - 99 mg/dL   BUN 11 6 - 20 mg/dL   Creatinine, Ser 0.69 0.44 - 1.00 mg/dL   Calcium 9.0 8.9 - 10.3 mg/dL   Total Protein 7.3  6.5 - 8.1 g/dL   Albumin 4.4 3.5 - 5.0 g/dL   AST 20 15 - 41 U/L   ALT 12 (L) 14 - 54 U/L   Alkaline Phosphatase 37 (L) 38 - 126 U/L   Total Bilirubin <0.1 (L) 0.3 - 1.2 mg/dL   GFR calc non Af Amer >60 >60 mL/min   GFR calc Af Amer >60 >60 mL/min    Comment: (NOTE) The eGFR has been calculated using the CKD EPI equation. This calculation has not been validated in all clinical situations. eGFR's persistently <60 mL/min signify possible Chronic Kidney Disease.    Anion gap 6 5 - 15  CBC     Status: Abnormal   Collection Time: 07/15/15 12:04 PM  Result Value Ref Range   WBC 11.8 (H) 3.6 - 11.0 K/uL   RBC 4.42 3.80 - 5.20 MIL/uL   Hemoglobin 12.6 12.0 - 16.0 g/dL   HCT 37.2 35.0 - 47.0 %   MCV 84.0 80.0 - 100.0 fL   MCH 28.4 26.0 - 34.0 pg   MCHC 33.8 32.0 - 36.0 g/dL   RDW 14.0 11.5 - 14.5 %   Platelets 228 150 - 440 K/uL  Urinalysis complete, with microscopic     Status: Abnormal   Collection Time: 07/15/15 12:04 PM  Result Value Ref Range   Color, Urine STRAW (A) YELLOW   APPearance CLEAR (A) CLEAR   Glucose, UA NEGATIVE NEGATIVE mg/dL   Bilirubin Urine NEGATIVE NEGATIVE   Ketones, ur NEGATIVE NEGATIVE mg/dL   Specific Gravity, Urine 1.009 1.005 - 1.030   Hgb urine dipstick NEGATIVE NEGATIVE   pH 7.0 5.0 - 8.0   Protein, ur NEGATIVE NEGATIVE mg/dL   Nitrite NEGATIVE NEGATIVE   Leukocytes, UA NEGATIVE NEGATIVE   RBC / HPF 0-5 0 - 5 RBC/hpf   WBC, UA 0-5 0 - 5 WBC/hpf   Bacteria, UA NONE SEEN NONE SEEN   Squamous Epithelial / LPF 6-30 (A) NONE SEEN  Pregnancy, urine POC     Status: None   Collection Time: 07/15/15 12:11 PM  Result Value Ref Range   Preg Test, Ur NEGATIVE NEGATIVE    Comment:        THE SENSITIVITY OF THIS METHODOLOGY IS >24 mIU/mL    US Breast Complete Uni Left Inc Axilla  07/14/2015  CORE BREAST BIOPSY REPORT Name:  Zoya A Ullom DOB:  30-Apr-1980  Lesion:  mass Location:    Left   2o'clock position 5cmfn Local anesthetic:   8 ml of 1%  Xylocaine/0.5% Marcaine Prep:  Chloro Prep Device:  14 Gauge   Bard   Ultrasound guidance:  used Patient tolerance:  Patient tolerated the procedure well Approach: LM Clip: placed Dressing: steri strips, telfa tegaderm Ice pack applied. Written instructions provided to patient regarding wound care. Patient advised that patient will be contacted by phone when pathology report is available. Followup appointment to be scheduled after pathology. CC: _0 @, Arther Abbott G    Review of Systems  Constitutional: Negative.   Respiratory: Negative.   Cardiovascular: Negative.   Gastrointestinal: Positive for heartburn, nausea and abdominal pain. Negative for diarrhea and constipation.  Neurological: Positive for headaches.    Blood pressure 110/69, pulse 67, temperature 97.6 F (36.4 C), temperature source Oral, resp. rate 18, height _1  (1.626 m), weight 130 lb (58.968 kg), last menstrual period 07/01/2015, SpO2 100 %. Physical Exam  Constitutional: She is oriented to person, place, and time. She appears well-developed and well-nourished.  Eyes: Conjunctivae are normal.  Neck: Neck supple. No thyromegaly present.  Cardiovascular: Normal rate, regular rhythm and normal heart sounds.   Respiratory: Effort normal and breath sounds normal.  GI: Soft. Normal appearance and bowel sounds are normal. There is no hepatomegaly. There is tenderness in the epigastric area. There is no rebound. No hernia.  Lymphadenopathy:    She has no cervical adenopathy.  Neurological: She is alert and oriented to person, place, and time.  Skin: Skin is warm and dry.     Assessment/Plan Nausea and epigastric pain, unresponsive to therapy outlined above. Plan for admission to observation, IVF, CT abdomen/pelvis  Christene Lye, MD 07/15/2015, 3:21 PM

## 2015-07-15 NOTE — ED Notes (Signed)
Pt denies offered Zofran for nausea.

## 2015-07-15 NOTE — Progress Notes (Signed)
Patient ID: Marissa Salazar, female   DOB: 04-Dec-1980, 35 y.o.   MRN: [id number] CT abdomen/pelvis is normal. Will treat with IVF, some sedation(xanax) and pain meds prn. Continue Protonix

## 2015-07-15 NOTE — ED Provider Notes (Signed)
----------------------------------------- 3:51 PM on 07/15/2015 -----------------------------------------  [facility] Emergency Department Provider Note  ____________________________________________   I have reviewed the triage vital signs and the nursing notes.   HISTORY  Chief Complaint Abdominal Pain; Nausea; and Back Pain    HPI Marissa Salazar is a 35 y.o. female patient is actually boarding in the emergency department. She was seen by Dr. Jamal Collin as his primary patient he is are to put in orders. As she has been boarding in the emergency room however I have been asked to make sure that there is no evidence of acute decompensation while she waits for her room. Patient has had low back pain for some weeks, decreased appetite, and urinary frequency. Denies any dysuria, denies any numbness or weakness, denies any trauma or fall. She has had a mild headache with no focal neurologic deficit. She has had abdominal pain off and on for a month or so.     Past Medical History  Diagnosis Date  . Abnormal Pap smear     in past; normal colpo  . Allergic rhinitis   . Allergy     Patient Active Problem List   Diagnosis Date Noted  . Epigastric pain 07/15/2015  . Esophageal reflux 07/09/2015  . Family history of breast cancer in female 05/28/2014    Past Surgical History  Procedure Laterality Date  . Colposcopy  2009  . Bunionectomy Right 2013  . Breast biopsy Left 06-12-14    fibroadenoma    Current Outpatient Rx  Name  Route  Sig  Dispense  Refill  . metoCLOPramide (REGLAN) 5 MG tablet   Oral   Take 1 tablet (5 mg total) by mouth 4 (four) times daily -  before meals and at bedtime.   60 tablet   0   . Multiple Vitamin (MULTIVITAMIN) tablet   Oral   Take 1 tablet by mouth daily.           Allergies Ivp dye  Family History  Problem Relation Age of Onset  . Pancreatic cancer Maternal Aunt   . Breast cancer Maternal Aunt   . Colon cancer  Maternal Grandmother 42  . Breast cancer Maternal Grandmother   . Breast cancer      GP  . Lung cancer      GP  . Diabetes Paternal Uncle     Social History Social History  Substance Use Topics  . Smoking status: Never Smoker   . Smokeless tobacco: Never Used  . Alcohol Use: 0.0 oz/week    0 Standard drinks or equivalent per week     Comment: Rare    Review of Systems See history of present illness, otherwise negative  ____________________________________________   PHYSICAL EXAM:  VITAL SIGNS: ED Triage Vitals  Enc Vitals Group     BP 07/15/15 1158 117/87 mmHg     Pulse Rate 07/15/15 1158 72     Resp 07/15/15 1158 18     Temp 07/15/15 1158 97.6 F (36.4 C)     Temp Source 07/15/15 1158 Oral     SpO2 07/15/15 1158 100 %     Weight 07/15/15 1158 130 lb (58.968 kg)     Height 07/15/15 1158 5\' 4"  (1.626 m)     Head Cir --      Peak Flow --      Pain Score 07/15/15 1159 8     Pain Loc --      Pain Edu? --  Excl. in [place]? --     Constitutional: Alert and oriented. Well appearing and in no acute distress.Initially anxious and upset Eyes: Conjunctivae are normal. PERRL. EOMI. Head: Atraumatic. Nose: No congestion/rhinnorhea. Mouth/Throat: Mucous membranes are moist.  Oropharynx non-erythematous. Neck: No stridor.   Nontender with no meningismus Cardiovascular: Normal rate, regular rhythm. Grossly normal heart sounds.  Good peripheral circulation. Respiratory: Normal respiratory effort.  No retractions. Lungs CTAB. Abdominal: Soft and nontender. No distention. No guarding no rebound Back:  There is no focal tenderness or step off there is no midline tenderness there are no lesions noted. there is no CVA tenderness Musculoskeletal: No lower extremity tenderness. No joint effusions, no DVT signs strong distal pulses no edema Neurologic:  Normal speech and language. No gross focal neurologic deficits are appreciated.  Skin:  Skin is warm, dry and intact. No rash  noted. Psychiatric: Mood and affect are normal. Speech and behavior are normal.  ____________________________________________   LABS (all labs ordered are listed, but only abnormal results are displayed)  Labs Reviewed  COMPREHENSIVE METABOLIC PANEL - Abnormal; Notable for the following:    Glucose, Bld 102 (*)    ALT 12 (*)    Alkaline Phosphatase 37 (*)    Total Bilirubin <0.1 (*)    All other components within normal limits  CBC - Abnormal; Notable for the following:    WBC 11.8 (*)    All other components within normal limits  URINALYSIS COMPLETEWITH MICROSCOPIC ([facility] ONLY) - Abnormal; Notable for the following:    Color, Urine STRAW (*)    APPearance CLEAR (*)    Squamous Epithelial / LPF 6-30 (*)    All other components within normal limits  LIPASE, BLOOD  POC URINE PREG, ED  POCT PREGNANCY, URINE   ____________________________________________  EKG  I personally interpreted any EKGs ordered by me or triage  ____________________________________________  RADIOLOGY  I reviewed any imaging ordered by me or triage that were performed during my shift and, if possible, patient and/or family made aware of any abnormal findings. ____________________________________________   PROCEDURES  Procedure(s) performed: None  Critical Care performed: None  ____________________________________________   INITIAL IMPRESSION / ASSESSMENT AND PLAN / ED COURSE  Pertinent labs & imaging results that were available during my care of the patient were reviewed by me and considered in my medical decision making (see chart for details).  As noted, this patient was seen and evaluated by her private physician, she is not a primarily emergency room patient. She already has orders and she is being managed by him. As a matter protocol however because she is boarding in the emergency room I did visit with the patient to make sure that there is no evidence of emergency decompensation while  awaiting admission. H&H is stable vital signs are reassuring blood work is reassuring she is neurologically intact, and she is being worked up by her physician. There is no evidence at this time of a acute abdominal process, neurologic process, and she has no evidence of meningitis or head bleed or other acute intracranial pathology to the extent they can be determined. Her exam is reassuring vital signs are reassuring. We will therefore continue to defer her care to the physician that has already seen her, placed orders, and we are only waiting for a bed. ____________________________________________   FINAL CLINICAL IMPRESSION(S) / ED DIAGNOSES  Final diagnoses:  Epigastric pain      This chart was dictated using voice recognition software.  Despite best efforts to  proofread,  errors can occur which can change meaning.      Schuyler Amor, MD 07/15/15 [phone]

## 2015-07-15 NOTE — ED Notes (Signed)
States mid abd pain for 1 month with nausea, denies any vomiting, states this AM she had sharp back pain, states urinary frequency, pt tearful, mother at bedside, awake and alert

## 2015-07-15 NOTE — ED Notes (Signed)
Pt arrives to ER from work via [place] c/o headache and back pain, no injury. Pt reports that she feels like she needs to pee "all of the time". Pt has had lower abdominal pain X 1 month that is followed by her primary care. Pt had upper GI done last week. Pt alert and oriented X4, active, cooperative, pt in NAD. RR even and unlabored, color WNL.

## 2015-07-16 ENCOUNTER — Ambulatory Visit: Payer: 59

## 2015-07-16 ENCOUNTER — Ambulatory Visit (HOSPITAL_COMMUNITY): Payer: 59

## 2015-07-16 ENCOUNTER — Ambulatory Visit: Payer: 59 | Admitting: General Surgery

## 2015-07-16 DIAGNOSIS — K2970 Gastritis, unspecified, without bleeding: Secondary | ICD-10-CM | POA: Diagnosis not present

## 2015-07-16 DIAGNOSIS — D72829 Elevated white blood cell count, unspecified: Secondary | ICD-10-CM | POA: Diagnosis not present

## 2015-07-16 DIAGNOSIS — Z833 Family history of diabetes mellitus: Secondary | ICD-10-CM | POA: Diagnosis not present

## 2015-07-16 DIAGNOSIS — A048 Other specified bacterial intestinal infections: Secondary | ICD-10-CM | POA: Diagnosis not present

## 2015-07-16 DIAGNOSIS — M545 Low back pain: Secondary | ICD-10-CM | POA: Diagnosis not present

## 2015-07-16 DIAGNOSIS — Z800 Family history of malignant neoplasm of digestive organs: Secondary | ICD-10-CM | POA: Diagnosis not present

## 2015-07-16 DIAGNOSIS — K219 Gastro-esophageal reflux disease without esophagitis: Secondary | ICD-10-CM | POA: Diagnosis not present

## 2015-07-16 DIAGNOSIS — J309 Allergic rhinitis, unspecified: Secondary | ICD-10-CM | POA: Diagnosis not present

## 2015-07-16 DIAGNOSIS — G43109 Migraine with aura, not intractable, without status migrainosus: Secondary | ICD-10-CM | POA: Diagnosis not present

## 2015-07-16 DIAGNOSIS — Z801 Family history of malignant neoplasm of trachea, bronchus and lung: Secondary | ICD-10-CM | POA: Diagnosis not present

## 2015-07-16 MED ORDER — SUMATRIPTAN SUCCINATE 100 MG PO TABS
100.0000 mg | ORAL_TABLET | ORAL | Status: DC | PRN
  Administered 2015-07-16: 100 mg via ORAL
  Filled 2015-07-16 (×2): qty 1

## 2015-07-16 NOTE — H&P (Signed)
GI Inpatient Consult Note  Reason for Consult: Epigastric pain, post-prandial nausea    Attending Requesting Consult: Dr. Jamal Collin  History of Present Illness: Marissa Salazar is a 35 y.o. female with no significant PMHx admitted with persistent epigastric pain and post-prandial nausea unresponsive to multiple medications.  Patient reports epigastric and LUQ pain 3-4 weeks.  She describes the pain as a burning sensation that radiates to the retrosternal area as well as around to the mid thoracic area.  Severity 8/10 at worst.  All foods and liquids except water because the discomfort.  Associated symptoms include nausea 1 hour postprandial and early satiety.  She notes symptoms awoke her from sleep on 2 separate occasions; she also notes diaphoresis during these episodes.  Patient denies significant NSAID use, tobacco use, or EtOH use.  She has tried multiple medications including OTC Tums, Zofran ODT 4 mg, Protonix 40 mg twice daily, Carafate 1 g q. before meals at bedtime, and Reglan 5 mg twice daily; none of these medications have provided only mild, transient relief.  Patient was seen at urgent care on 06/23/15 for complaints of headache, nausea, and chills 2 days.  She reported a tick bite, so course of doxycycline and Zofran ODT 4 mg were prescribed.  She presented to Dr. Jamal Collin on 06/29/15 due to persistence of symptoms.  Labs revealed lipase 127, H pylori IgM + (23.5).  RUQ US demonstrated a contracted gallbladder without gallstones or cholecystitis; a 7 mm hepatic hemangioma was also incidentally noted.  On 07/09/15, patient was evaluated by Dr. Bary Castilla due to lack of improvement.  Upper GI series with small bowel follow-through demonstrated moderate GERD with normal follow-through.  Direct admission was recommended on 07/15/15 due to intractable symptoms and decreased oral intake.  Labs drawn upon admission showed mild leukocytosis (11.8); CMP and lipase within normal limits.  CT A/P was negative for  acute pathologies.  Since admission, patient reports continued epigastric/LUQ pain and nausea.  No recent vomiting (only 1 episode in last 4 weeks).  She is tolerating ice chips prn, but is otherwise NPO.  IV emetics, tylenol, and po Protonix only "dull the pain" per patient.  She also notes very low back pain, pointing to her coccyx, as well as a persistent headache.  She is unsure if these are correlated with her GI symptoms.  Her appetite is decreased 2/2 pain and nausea.  She denies dysphagia, heartburn, esophagus reflux.  No significant NSAID, tobacco, or EtOH use.  Also no unexplained weight loss, hematochezia, or melena.  No prior abdominal surgeries.  FHx of CCA in her maternal GM (onset age 29).   Past Medical History:  Past Medical History  Diagnosis Date  . Abnormal Pap smear     in past; normal colpo  . Allergic rhinitis   . Allergy     Problem List: Patient Active Problem List   Diagnosis Date Noted  . Epigastric pain 07/15/2015  . Esophageal reflux 07/09/2015  . Family history of breast cancer in female 05/28/2014    Past Surgical History: Past Surgical History  Procedure Laterality Date  . Colposcopy  2009  . Bunionectomy Right 2013  . Breast biopsy Left 06-12-14    fibroadenoma    Allergies: Allergies  Allergen Reactions  . Ivp Dye [Iodinated Diagnostic Agents] Hives    Home Medications: Prescriptions prior to admission  Medication Sig Dispense Refill Last Dose  . metoCLOPramide (REGLAN) 5 MG tablet Take 1 tablet (5 mg total) by mouth 4 (four) times  daily -  before meals and at bedtime. 60 tablet 0 07/11/2015 at Unknown time  . Multiple Vitamin (MULTIVITAMIN) tablet Take 1 tablet by mouth daily.   07/14/2015 at Unknown time   Home medication reconciliation was completed with the patient.   Scheduled Inpatient Medications:   . pantoprazole  40 mg Oral BID    Continuous Inpatient Infusions:   . dextrose 5 % and 0.45% NaCl 125 mL/hr at 07/15/15 1746    PRN  Inpatient Medications:  acetaminophen **OR** acetaminophen, ALPRAZolam, morphine injection, ondansetron **OR** ondansetron (ZOFRAN) IV, oxyCODONE  Family History: family history includes Breast cancer in her maternal aunt and maternal grandmother; Colon cancer (age of onset: 68) in her maternal grandmother; Diabetes in her paternal uncle; Pancreatic cancer in her maternal aunt.    Social History:   reports that she has never smoked. She has never used smokeless tobacco. She reports that she drinks alcohol. She reports that she does not use illicit drugs.   Review of Systems: Constitutional: Weight is stable. Eyes: No changes in vision. ENT: No oral lesions, sore throat.  GI: see HPI.  Heme/Lymph: No easy bruising.  CV: No chest pain.  GU: No hematuria.  Integumentary: No rashes.  Neuro: No headaches.  Psych: No depression/anxiety.  Endocrine: No heat/cold intolerance.  Allergic/Immunologic: No urticaria.  Resp: No cough, SOB.  Musculoskeletal: No joint swelling.    Physical Examination: BP 103/66 mmHg  Pulse 65  Temp(Src) 97.8 F (36.6 C) (Oral)  Resp 16  Ht 5\' 4"  (1.626 m)  Wt 58.968 kg (130 lb)  BMI 22.30 kg/m2  SpO2 100%  LMP 07/01/2015 Gen: NAD, alert and oriented x 4 HEENT: PEERLA, EOMI, Neck: supple, no JVD or thyromegaly Chest: CTA bilaterally, no wheezes, crackles, or other adventitious sounds CV: RRR, no m/g/c/r Abd: soft, NT, ND, +BS in all four quadrants; no HSM, guarding, ridigity, or rebound tenderness Ext: no edema, well perfused with 2+ pulses, Skin: no rash or lesions noted Lymph: no LAD  Data: Lab Results  Component Value Date   WBC 11.8* 07/15/2015   HGB 12.6 07/15/2015   HCT 37.2 07/15/2015   MCV 84.0 07/15/2015   PLT 228 07/15/2015    Recent Labs Lab 07/15/15 1204  HGB 12.6   Lab Results  Component Value Date   NA 136 07/15/2015   K 3.8 07/15/2015   CL 104 07/15/2015   CO2 26 07/15/2015   BUN 11 07/15/2015   CREATININE 0.69  07/15/2015   Lab Results  Component Value Date   ALT 12* 07/15/2015   AST 20 07/15/2015   ALKPHOS 37* 07/15/2015   BILITOT <0.1* 07/15/2015   No results for input(s): APTT, INR, PTT in the last 168 hours.   Assessment/Plan: Ms. Fu is a 34 y.o. female with no significant PMHx admitted with intractable epigastric pain and postprandial nausea.  Patient failed outpatient management with multiple medications for nausea, GERD, and symptomatic relief of pain with Carafate.  Labs on admission were only notable for mild leukocytosis.  Lipase, which was previously elevated at 127, is now WNL.  H. pylori serology also demonstrated a possible prior infection, but not consistent with current infection.  RUQ [place], which demonstrated a somewhat contracted gallbladder without cholecystitis or cholelithiasis.  UGI series with SBFT showed moderate GERD.  CT A/P on admission was negative for acute pathologies.  Recommend HIDA scan with EF to evaluate for gallbladder dysfunction.  If this returns normal, recommend EGD to evaluate for PUD, gastritis, and reflux  esophagitis.  Patient voices understanding to risks/benefits of procedures, and all questions answered.      Recommendations: - HIDA scan today to exclude gallbladder dysfunction - If HIDA normal, will plan for EGD tomorrow morning to exclude PUD, gastritis, reflux esophagitis - If EGD tomorrow (per Dr. Vira Agar), may advance diet to clear liquids tonight; then NPO after midnight - Continue current supportive measures - Further recs per Dr. Vira Agar  Thank you for the consult. We will follow along with you. Please call with questions or concerns.  Lavera Guise, PA-C [facility] Phone: [phone] Pager: [phone]

## 2015-07-16 NOTE — Consult Note (Signed)
Patient with several weeks of epigastric abd pain not responding to PPI and carafate.  Will do EGD tomorrow and hold on HIDA as this sounds more like gastric inflammation.  In March and April she took Apple cider vinegar for 2 months and this could be the source of her pain.

## 2015-07-16 NOTE — Progress Notes (Signed)
Patient ID: Marissa Salazar, female   DOB: [birth year], 35 y.o.   MRN: [id number] Pt continues to complain of headache, low back pain-coccyx area. Also nausea persists, having same epigastric and luq pain. Abdomen is soft, flat, scant tenderness luq. Active bowel sounds Lungs clear. Heart, nsr, nl rate.  Back- no pertinent findngs. Pupils ER, Conj is pink, no icterus. Symptom complex does not seem connected.  Have asked for GI consult and IM consult.

## 2015-07-16 NOTE — Consult Note (Signed)
[facility] [place] NAME: Marissa Salazar    MR#:  [id number]  DATE OF BIRTH:  11/20/1980  DATE OF ADMISSION:  07/15/2015  PRIMARY CARE PHYSICIAN: Arnette Norris, MD   REQUESTING/REFERRING PHYSICIAN: sankar  CHIEF COMPLAINT:   Chief Complaint  Patient presents with  . Abdominal Pain  . Nausea  . Back Pain    HISTORY OF PRESENT ILLNESS:  Marissa Salazar  is a 35 y.o. female without significant medical history complaining of LUQ abdominal pain for 3-4 weeks, mainly 30-60 mins post prandial, associated nausea without emesis, diarrhea, constipation. 2 week duration back pain - lumbar, central, non radiating, 2-3 days frontal headache non radiating no association with lights/loud noises but sees "dots" prior to headache. She has been admitted for the above cluster of symptoms all testing results negative thus far. Medical consult for continue symptoms/headache Also denotes whole body paraesthesias L>R PAST MEDICAL HISTORY:   Past Medical History  Diagnosis Date  . Abnormal Pap smear     in past; normal colpo  . Allergic rhinitis   . Allergy     PAST SURGICAL HISTOIRY:   Past Surgical History  Procedure Laterality Date  . Colposcopy  2009  . Bunionectomy Right 2013  . Breast biopsy Left 06-12-14    fibroadenoma    SOCIAL HISTORY:   Social History  Substance Use Topics  . Smoking status: Never Smoker   . Smokeless tobacco: Never Used  . Alcohol Use: 0.0 oz/week    0 Standard drinks or equivalent per week     Comment: Rare    FAMILY HISTORY:   Family History  Problem Relation Age of Onset  . Pancreatic cancer Maternal Aunt   . Breast cancer Maternal Aunt   . Colon cancer Maternal Grandmother 22  . Breast cancer Maternal Grandmother   . Breast cancer      GP  . Lung cancer      GP  . Diabetes Paternal Uncle     DRUG ALLERGIES:   Allergies  Allergen Reactions  . Ivp Dye [Iodinated Diagnostic Agents] Hives    REVIEW  OF SYSTEMS:  CONSTITUTIONAL: No fever, fatigue or weakness.  EYES: No blurred or double vision.  EARS, NOSE, AND THROAT: No tinnitus or ear pain.  RESPIRATORY: No cough, shortness of breath, wheezing or hemoptysis.  CARDIOVASCULAR: No chest pain, orthopnea, edema.  GASTROINTESTINAL: positive nausea, no vomiting, diarrhea positive abdominal pain.  GENITOURINARY: No dysuria, hematuria.  ENDOCRINE: No polyuria, nocturia,  HEMATOLOGY: No anemia, easy bruising or bleeding SKIN: No rash or lesion. MUSCULOSKELETAL: No joint pain or arthritis.   NEUROLOGIC: positive tingling, no numbness, weakness.  PSYCHIATRY: No anxiety or depression.   MEDICATIONS AT HOME:   Prior to Admission medications   Medication Sig Start Date End Date Taking? Authorizing Provider  metoCLOPramide (REGLAN) 5 MG tablet Take 1 tablet (5 mg total) by mouth 4 (four) times daily -  before meals and at bedtime. 07/09/15  Yes Robert Bellow, MD  Multiple Vitamin (MULTIVITAMIN) tablet Take 1 tablet by mouth daily.   Yes Historical Provider, MD      VITAL SIGNS:  Blood pressure 103/66, pulse 65, temperature 97.8 F (36.6 C), temperature source Oral, resp. rate 16, height 5\' 4"  (1.626 m), weight 130 lb (58.968 kg), last menstrual period 07/01/2015, SpO2 100 %.  PHYSICAL EXAMINATION:  GENERAL:  35 y.o.-year-old patient lying in the bed with no acute distress.  EYES: Pupils equal, round, reactive  to light and accommodation. No scleral icterus. Extraocular muscles intact.  HEENT: Head atraumatic, normocephalic. Oropharynx and nasopharynx clear.  NECK:  Supple, no jugular venous distention. No thyroid enlargement, no tenderness.  LUNGS: Normal breath sounds bilaterally, no wheezing, rales,rhonchi or crepitation. No use of accessory muscles of respiration.  CARDIOVASCULAR: S1, S2 normal. No murmurs, rubs, or gallops.  ABDOMEN: Soft, minimal tenderness LUQ without rebound, nondistended. Bowel sounds present. No organomegaly or  mass.  EXTREMITIES: No pedal edema, cyanosis, or clubbing.  NEUROLOGIC: Cranial nerves II through XII are intact. Muscle strength 5/5 in all extremities. Sensation intact. Pronator drift within normal limits Gait not checked.  PSYCHIATRIC: The patient is alert and oriented x 3.  SKIN: No obvious rash, lesion, or ulcer.   LABORATORY PANEL:   CBC  Recent Labs Lab 07/15/15 1204  WBC 11.8*  HGB 12.6  HCT 37.2  PLT 228   ------------------------------------------------------------------------------------------------------------------  Chemistries   Recent Labs Lab 07/15/15 1204  NA 136  K 3.8  CL 104  CO2 26  GLUCOSE 102*  BUN 11  CREATININE 0.69  CALCIUM 9.0  AST 20  ALT 12*  ALKPHOS 37*  BILITOT <0.1*   ------------------------------------------------------------------------------------------------------------------  Cardiac Enzymes No results for input(s): TROPONINI in the last 168 hours. ------------------------------------------------------------------------------------------------------------------  RADIOLOGY:  Ct Abdomen Pelvis Wo Contrast  07/15/2015  CLINICAL DATA:  Back pain and urgency, initial encounter EXAM: CT ABDOMEN AND PELVIS WITHOUT CONTRAST TECHNIQUE: Multidetector CT imaging of the abdomen and pelvis was performed following the standard protocol without IV contrast. COMPARISON:  02/08/2015 CT of the abdomen and pelvis FINDINGS: Lung bases are free of acute infiltrate or sizable effusion. The liver, gallbladder, spleen, adrenal glands and pancreas are within normal limits. The kidneys are well visualized bilaterally without definitive renal calculi or obstructive change. No ureteral calculi are seen. The bladder is decompressed. The appendix is within normal limits. The uterus and ovaries appear within normal limits. No pelvic mass lesion is seen. No free pelvic fluid is seen. The osseous structures show no acute abnormality. IMPRESSION: No acute abnormality  to correspond with the patient's given clinical symptomatology is seen. Electronically Signed   By: Inez Catalina M.D.   On: 07/15/2015 17:04    EKG:   Orders placed or performed during the hospital encounter of 07/15/15  . ED EKG  . ED EKG  . EKG 12-Lead  . EKG 12-Lead    IMPRESSION AND PLAN:   35year old caucasian female without significant medical issues presenting with abdominal pain, back pain, headache  1. Migraine with aura?: would typically start with NSAIDs for headache but will hold off for now until GI issues are figured out. Can try 1 dose of triptan and see if there is any relief.  2. Abdominal pain: GI eval pending 3. Lumbago: non radiating supportive care  Interestingly it seems she has been complaining about intermittent back pain/abdominal pain since 2013 at least through our records.   All the records are reviewed and case discussed with Consulting provider. Management plans discussed with the patient, family and they are in agreement.  CODE STATUS: full  TOTAL TIME TAKING CARE OF THIS PATIENT:33 minutes.    Hower,  Karenann Cai.D on 07/16/2015 at 12:57 PM  Between 7am to 6pm - Pager - [phone]  After 6pm: House Pager: - [facility]  Office  [phone]  CC: Primary care Physician: Arnette Norris, MD

## 2015-07-17 ENCOUNTER — Encounter: Admission: EM | Disposition: A | Discharge status: Home / Self Care | Payer: Self-pay | Source: Home / Self Care

## 2015-07-17 ENCOUNTER — Observation Stay: Payer: 59 | Admitting: Anesthesiology

## 2015-07-17 ENCOUNTER — Encounter: Payer: Self-pay | Admitting: Anesthesiology

## 2015-07-17 ENCOUNTER — Ambulatory Visit: Payer: Self-pay | Admitting: Primary Care

## 2015-07-17 ENCOUNTER — Observation Stay: Payer: 59

## 2015-07-17 DIAGNOSIS — R1013 Epigastric pain: Secondary | ICD-10-CM | POA: Diagnosis not present

## 2015-07-17 DIAGNOSIS — K2900 Acute gastritis without bleeding: Secondary | ICD-10-CM | POA: Diagnosis not present

## 2015-07-17 DIAGNOSIS — K2960 Other gastritis without bleeding: Secondary | ICD-10-CM | POA: Diagnosis not present

## 2015-07-17 DIAGNOSIS — Z800 Family history of malignant neoplasm of digestive organs: Secondary | ICD-10-CM | POA: Diagnosis not present

## 2015-07-17 DIAGNOSIS — A048 Other specified bacterial intestinal infections: Secondary | ICD-10-CM | POA: Diagnosis not present

## 2015-07-17 DIAGNOSIS — K219 Gastro-esophageal reflux disease without esophagitis: Secondary | ICD-10-CM | POA: Diagnosis not present

## 2015-07-17 DIAGNOSIS — J309 Allergic rhinitis, unspecified: Secondary | ICD-10-CM | POA: Diagnosis not present

## 2015-07-17 DIAGNOSIS — Z801 Family history of malignant neoplasm of trachea, bronchus and lung: Secondary | ICD-10-CM | POA: Diagnosis not present

## 2015-07-17 DIAGNOSIS — D72829 Elevated white blood cell count, unspecified: Secondary | ICD-10-CM | POA: Diagnosis not present

## 2015-07-17 DIAGNOSIS — M545 Low back pain: Secondary | ICD-10-CM | POA: Diagnosis not present

## 2015-07-17 DIAGNOSIS — Z833 Family history of diabetes mellitus: Secondary | ICD-10-CM | POA: Diagnosis not present

## 2015-07-17 DIAGNOSIS — K2970 Gastritis, unspecified, without bleeding: Secondary | ICD-10-CM | POA: Diagnosis not present

## 2015-07-17 DIAGNOSIS — G43109 Migraine with aura, not intractable, without status migrainosus: Secondary | ICD-10-CM | POA: Diagnosis not present

## 2015-07-17 DIAGNOSIS — B9681 Helicobacter pylori [H. pylori] as the cause of diseases classified elsewhere: Secondary | ICD-10-CM | POA: Diagnosis not present

## 2015-07-17 HISTORY — PX: ESOPHAGOGASTRODUODENOSCOPY: SHX5428

## 2015-07-17 SURGERY — EGD (ESOPHAGOGASTRODUODENOSCOPY)
Anesthesia: General

## 2015-07-17 MED ORDER — ALPRAZOLAM 0.25 MG PO TABS: 0.2500 mg | ORAL_TABLET | Freq: Four times a day (QID) | ORAL | Status: DC | PRN

## 2015-07-17 MED ORDER — SUCRALFATE 1 GM/10ML PO SUSP
1.0000 g | Freq: Four times a day (QID) | ORAL | Status: DC
  Filled 2015-07-17 (×5): qty 10

## 2015-07-17 MED ORDER — PROPOFOL 500 MG/50ML IV EMUL
INTRAVENOUS | Status: DC | PRN
  Administered 2015-07-17: 100 ug/kg/min via INTRAVENOUS

## 2015-07-17 MED ORDER — LIDOCAINE HCL (PF) 2 % IJ SOLN
INTRAMUSCULAR | Status: DC | PRN
  Administered 2015-07-17: 50 mg

## 2015-07-17 MED ORDER — FENTANYL CITRATE (PF) 100 MCG/2ML IJ SOLN
INTRAMUSCULAR | Status: DC | PRN
  Administered 2015-07-17: 50 ug via INTRAVENOUS

## 2015-07-17 MED ORDER — SUCRALFATE 1 GM/10ML PO SUSP: 1.0000 g | Freq: Four times a day (QID) | ORAL | Status: DC

## 2015-07-17 MED ORDER — PANTOPRAZOLE SODIUM 40 MG PO TBEC: 40.0000 mg | DELAYED_RELEASE_TABLET | Freq: Two times a day (BID) | ORAL | Status: DC

## 2015-07-17 MED ORDER — MIDAZOLAM HCL 5 MG/5ML IJ SOLN
INTRAMUSCULAR | Status: DC | PRN
  Administered 2015-07-17 (×2): 1 mg via INTRAVENOUS

## 2015-07-17 MED ORDER — TRAMADOL HCL 50 MG PO TABS: 50.0000 mg | ORAL_TABLET | Freq: Four times a day (QID) | ORAL | Status: DC | PRN

## 2015-07-17 MED ORDER — PROPOFOL 10 MG/ML IV BOLUS
INTRAVENOUS | Status: DC | PRN
  Administered 2015-07-17: 50 mg via INTRAVENOUS

## 2015-07-17 NOTE — Final Progress Note (Signed)
Endoscopy showed mild gastritis. Biopsies done. Discussed with Dr. Vira Agar. She is to continue on Protonix and carafate

## 2015-07-17 NOTE — Anesthesia Preprocedure Evaluation (Signed)
Anesthesia Evaluation  Patient identified by MRN, date of birth, ID band Patient awake    Reviewed: Allergy & Precautions, H&P , NPO status , Patient's Chart, lab work & pertinent test results  History of Anesthesia Complications Negative for: history of anesthetic complications  Airway Mallampati: III  TM Distance: >3 FB Neck ROM: full    Dental  (+) Poor Dentition   Pulmonary neg pulmonary ROS, neg shortness of breath,    Pulmonary exam normal breath sounds clear to auscultation       Cardiovascular Exercise Tolerance: Good (-) angina(-) Past MI and (-) DOE negative cardio ROS Normal cardiovascular exam Rhythm:regular Rate:Normal     Neuro/Psych negative neurological ROS  negative psych ROS   GI/Hepatic Neg liver ROS, GERD  Controlled,  Endo/Other  negative endocrine ROS  Renal/GU negative Renal ROS  negative genitourinary   Musculoskeletal   Abdominal   Peds  Hematology negative hematology ROS (+)   Anesthesia Other Findings Past Medical History:   Abnormal Pap smear                                             Comment:in past; normal colpo   Allergic rhinitis                                            Allergy                                                     Past Surgical History:   COLPOSCOPY                                       2009         BUNIONECTOMY                                    Right 2013         BREAST BIOPSY                                   Left 06-12-14         Comment:fibroadenoma  BMI    Body Mass Index   22.30 kg/m 2      Reproductive/Obstetrics negative OB ROS                             Anesthesia Physical Anesthesia Plan  ASA: II  Anesthesia Plan: General   Post-op Pain Management:    Induction:   Airway Management Planned:   Additional Equipment:   Intra-op Plan:   Post-operative Plan:   Informed Consent: I have reviewed the patients  History and Physical, chart, labs and discussed the procedure including the risks, benefits and alternatives for the proposed anesthesia with the patient or authorized representative who has indicated his/her understanding and acceptance.   Dental  Advisory Given  Plan Discussed with: Anesthesiologist, CRNA and Surgeon  Anesthesia Plan Comments:         Anesthesia Quick Evaluation

## 2015-07-17 NOTE — Transfer of Care (Signed)
Immediate Anesthesia Transfer of Care Note  Patient: Marissa Salazar  Procedure(s) Performed: Procedure(s): ESOPHAGOGASTRODUODENOSCOPY (EGD) (N/A)  Patient Location: PACU  Anesthesia Type:General  Level of Consciousness: sedated  Airway & Oxygen Therapy: Patient Spontanous Breathing and Patient connected to nasal cannula oxygen  Post-op Assessment: Report given to RN and Post -op Vital signs reviewed and stable  Post vital signs: Reviewed and stable  Last Vitals:  Filed Vitals:   07/17/15 0604 07/17/15 0919  BP: 101/67 109/66  Pulse: 64 66  Temp: 36.5 C 35.8 C  Resp: 16 15    Last Pain:  Filed Vitals:   07/17/15 0921  PainSc: 8       Patients Stated Pain Goal: 0 ([id number])  Complications: No apparent anesthesia complications

## 2015-07-17 NOTE — Op Note (Signed)
[facility] Patient Name: Marissa Salazar Procedure Date: 07/17/2015 9:00 AM MRN: [id number] Account #: [id number] Date of Birth: 1980-08-18 Admit Type: Inpatient Age: 35 Room: [facility] ENDO ROOM 4 Gender: Female Note Status: Finalized Procedure:            Upper GI endoscopy Indications:          Epigastric abdominal pain, Abdominal pain in the left                        upper quadrant Providers:            Manya Silvas, MD Referring MD:         Marciano Sequin. Deborra Medina (Referring MD) Medicines:            Propofol per Anesthesia Complications:        No immediate complications. Procedure:            Pre-Anesthesia Assessment:                       - After reviewing the risks and benefits, the patient                        was deemed in satisfactory condition to undergo the                        procedure.                       After obtaining informed consent, the endoscope was                        passed under direct vision. Throughout the procedure,                        the patient's blood pressure, pulse, and oxygen                        saturations were monitored continuously. The Endoscope                        was introduced through the mouth, and advanced to the                        second part of duodenum. The upper GI endoscopy was                        accomplished without difficulty. The patient tolerated                        the procedure well. Findings:      The examined esophagus was normal. GEJ 38cm      Diffuse very minimal inflammation characterized by minimal erythema and       slight granularity was found in the gastric body. Biopsies were taken       with a cold forceps for histology. Biopsies were taken with a cold       forceps for Helicobacter pylori testing.      The examined duodenum was normal. Impression:           - Normal esophagus.                       -  Gastritis. Biopsied.                       - Normal  examined duodenum. Recommendation:       - Await pathology results. Take PPI and carafate. Manya Silvas, MD 07/17/2015 9:15:21 AM This report has been signed electronically. Number of Addenda: 0 Note Initiated On: 07/17/2015 9:00 AM      [facility]

## 2015-07-17 NOTE — Consult Note (Signed)
Very minimal erythematous slight grannular mucosa, bx done, origin of pain uncertain, would give carafate and PPI.

## 2015-07-17 NOTE — Progress Notes (Signed)
[place] [facility] [place] NAME: Marissa Salazar    MRN#:  [id number]  DATE OF BIRTH:  Jun 05, 1980  SUBJECTIVE:  Hospital Day: none Marissa Salazar is a 35 y.o. female presenting with Abdominal Pain; Nausea; and Back Pain .   Overnight events: no overnight events Interval Events: abd pain, head ache improved, still complains of back pain - coccyx, over the spine, sharp 7/10, non radiating  REVIEW OF SYSTEMS:  CONSTITUTIONAL: No fever, fatigue or weakness.  EYES: No blurred or double vision.  EARS, NOSE, AND THROAT: No tinnitus or ear pain.  RESPIRATORY: No cough, shortness of breath, wheezing or hemoptysis.  CARDIOVASCULAR: No chest pain, orthopnea, edema.  GASTROINTESTINAL: No nausea, vomiting, diarrhea or abdominal pain.  GENITOURINARY: No dysuria, hematuria.  ENDOCRINE: No polyuria, nocturia,  HEMATOLOGY: No anemia, easy bruising or bleeding SKIN: No rash or lesion. MUSCULOSKELETAL: No joint pain or arthritis.   NEUROLOGIC: No tingling, numbness, weakness.  PSYCHIATRY: No anxiety or depression.   DRUG ALLERGIES:   Allergies  Allergen Reactions  . Ivp Dye [Iodinated Diagnostic Agents] Hives    VITALS:  Blood pressure 107/74, pulse 64, temperature 98.1 F (36.7 C), temperature source Oral, resp. rate 17, height 5\' 4"  (1.626 m), weight 130 lb (58.968 kg), last menstrual period 07/01/2015, SpO2 100 %.  PHYSICAL EXAMINATION:  VITAL SIGNS: Filed Vitals:   07/17/15 0919 07/17/15 1034  BP: 109/66 107/74  Pulse: 66 64  Temp: 96.4 F (35.8 C) 98.1 F (36.7 C)  Resp: 15 17   GENERAL:34 y.o.female currently in no acute distress.  HEAD: Normocephalic, atraumatic.  EYES: Pupils equal, round, reactive to light. Extraocular muscles intact. No scleral icterus.  MOUTH: Moist mucosal membrane. Dentition intact. No abscess noted.  EAR, NOSE, THROAT: Clear without exudates. No external lesions.  NECK: Supple. No thyromegaly. No nodules. No  JVD.  PULMONARY: Clear to ascultation, without wheeze rails or rhonci. No use of accessory muscles, Good respiratory effort. good air entry bilaterally CHEST: Nontender to palpation.  CARDIOVASCULAR: S1 and S2. Regular rate and rhythm. No murmurs, rubs, or gallops. No edema. Pedal pulses 2+ bilaterally.  GASTROINTESTINAL: Soft, nontender, nondistended. No masses. Positive bowel sounds. No hepatosplenomegaly.  MUSCULOSKELETAL: No swelling, clubbing, or edema. Range of motion full in all extremities.  NEUROLOGIC: Cranial nerves II through XII are intact. No gross focal neurological deficits. Sensation intact. Reflexes intact.  SKIN: No ulceration, lesions, rashes, or cyanosis. Skin warm and dry. Turgor intact.  PSYCHIATRIC: Mood, affect within normal limits. The patient is awake, alert and oriented x 3. Insight, judgment intact.      LABORATORY PANEL:   CBC  Recent Labs Lab 07/15/15 1204  WBC 11.8*  HGB 12.6  HCT 37.2  PLT 228   ------------------------------------------------------------------------------------------------------------------  Chemistries   Recent Labs Lab 07/15/15 1204  NA 136  K 3.8  CL 104  CO2 26  GLUCOSE 102*  BUN 11  CREATININE 0.69  CALCIUM 9.0  AST 20  ALT 12*  ALKPHOS 37*  BILITOT <0.1*   ------------------------------------------------------------------------------------------------------------------  Cardiac Enzymes No results for input(s): TROPONINI in the last 168 hours. ------------------------------------------------------------------------------------------------------------------  RADIOLOGY:  Ct Abdomen Pelvis Wo Contrast  07/15/2015  CLINICAL DATA:  Back pain and urgency, initial encounter EXAM: CT ABDOMEN AND PELVIS WITHOUT CONTRAST TECHNIQUE: Multidetector CT imaging of the abdomen and pelvis was performed following the standard protocol without IV contrast. COMPARISON:  02/08/2015 CT of the abdomen and pelvis FINDINGS: Lung bases  are free of acute  infiltrate or sizable effusion. The liver, gallbladder, spleen, adrenal glands and pancreas are within normal limits. The kidneys are well visualized bilaterally without definitive renal calculi or obstructive change. No ureteral calculi are seen. The bladder is decompressed. The appendix is within normal limits. The uterus and ovaries appear within normal limits. No pelvic mass lesion is seen. No free pelvic fluid is seen. The osseous structures show no acute abnormality. IMPRESSION: No acute abnormality to correspond with the patient's given clinical symptomatology is seen. Electronically Signed   By: Inez Catalina M.D.   On: 07/15/2015 17:04    EKG:   Orders placed or performed during the hospital encounter of 07/15/15  . ED EKG  . ED EKG  . EKG 12-Lead  . EKG 12-Lead    ASSESSMENT AND PLAN:   Marissa Salazar is a 35 y.o. female presenting with Abdominal Pain; Nausea; and Back Pain . Admitted 07/15/2015 : Day #: none 1. Migraine: resolved 2. Gastritis:L ppi/carafate per gi 3. Back pain:consider outpatient imaging if continues 4. Anxiety: this is likely playing a large role in her symptoms - states stress at home in addition to general concern of medical conditions -- rec prn xanax   All the records are reviewed and case discussed with Care Management/Social Workerr. Management plans discussed with the patient, family and they are in agreement.  CODE STATUS: full TOTAL TIME TAKING CARE OF THIS PATIENT: 28 minutes.   POSSIBLE D/C IN 1DAYS, DEPENDING ON CLINICAL CONDITION.   Hower,  Karenann Cai.D on 07/17/2015 at 12:44 PM  Between 7am to 6pm - Pager - [phone]  After 6pm: House Pager: - [facility]  Office  [phone]  CC: Primary care physician; Arnette Norris, MD

## 2015-07-17 NOTE — Anesthesia Postprocedure Evaluation (Signed)
Anesthesia Post Note  Patient: Marissa Salazar  Procedure(s) Performed: Procedure(s) (LRB): ESOPHAGOGASTRODUODENOSCOPY (EGD) (N/A)  Patient location during evaluation: Endoscopy Anesthesia Type: General Level of consciousness: awake and alert Pain management: pain level controlled Vital Signs Assessment: post-procedure vital signs reviewed and stable Respiratory status: spontaneous breathing, nonlabored ventilation, respiratory function stable and patient connected to nasal cannula oxygen Cardiovascular status: blood pressure returned to baseline and stable Postop Assessment: no signs of nausea or vomiting Anesthetic complications: no    Last Vitals:  Filed Vitals:   07/17/15 0919 07/17/15 1034  BP: 109/66 107/74  Pulse: 66 64  Temp: 35.8 C 36.7 C  Resp: 15 17    Last Pain:  Filed Vitals:   07/17/15 1035  PainSc: 8                  Reinhardt Licausi K Avary Pitsenbarger

## 2015-07-18 NOTE — Discharge Summary (Signed)
  35 yr old female with 1 mo history of post prandial nausea, occasional vomiting ans epigastric/luq pain.  W/U included, [place], UGI-normal. Labs were normal. IgM antibody was pos but antigen was negative. She has been treated with Protonix, carafate, Reglan. Symptoms never improved and she felt much worse on 07/15/15. She was admitted to observation then.  Also in last few days she c/o headache  And some pain in low back/sacral area-both new. In-hospital she received IV fluids, Zofran for nausea, narcotic pain medicine. CT scan was done and compared to the study from January of this year with no significant findings. Internal medicine consult and GI consult were obtained. Again her labs were essentially normal except for a white count 11,000. Patient had endoscopy performed which showed evidence of mild gastritis and biopsies were obtained but the report is pending on this. The patient was continued to complain of low back pain which does not feel associated with her nausea. Headache likewise is a likely not associated with her significant ongoing abdominal symptoms. After discussion with Dr. Vira Agar was decided to discharge the patient on Protonix and Carafate. Further plan will be based on her biopsy results.  Discharge diagnosis: Postprandial nausea. Epigastric pain. Mild gastritis

## 2015-07-20 ENCOUNTER — Other Ambulatory Visit: Payer: Self-pay | Admitting: Primary Care

## 2015-07-20 ENCOUNTER — Telehealth: Payer: Self-pay | Admitting: Family Medicine

## 2015-07-20 ENCOUNTER — Ambulatory Visit (INDEPENDENT_AMBULATORY_CARE_PROVIDER_SITE_OTHER)
Admission: RE | Admit: 2015-07-20 | Discharge: 2015-07-20 | Disposition: A | Discharge status: Home / Self Care | Payer: 59 | Source: Ambulatory Visit | Attending: Primary Care | Admitting: Primary Care

## 2015-07-20 ENCOUNTER — Encounter: Payer: Self-pay | Admitting: Primary Care

## 2015-07-20 ENCOUNTER — Ambulatory Visit (INDEPENDENT_AMBULATORY_CARE_PROVIDER_SITE_OTHER): Payer: 59 | Admitting: Primary Care

## 2015-07-20 ENCOUNTER — Other Ambulatory Visit: Payer: Self-pay | Admitting: General Surgery

## 2015-07-20 VITALS — BP 114/64 | HR 69 | Temp 97.9°F | Ht 64.0 in | Wt 127.1 lb

## 2015-07-20 DIAGNOSIS — R519 Headache, unspecified: Secondary | ICD-10-CM

## 2015-07-20 DIAGNOSIS — M5440 Lumbago with sciatica, unspecified side: Secondary | ICD-10-CM

## 2015-07-20 DIAGNOSIS — K2970 Gastritis, unspecified, without bleeding: Principal | ICD-10-CM

## 2015-07-20 DIAGNOSIS — R51 Headache: Principal | ICD-10-CM

## 2015-07-20 DIAGNOSIS — R1084 Generalized abdominal pain: Secondary | ICD-10-CM | POA: Diagnosis not present

## 2015-07-20 DIAGNOSIS — B9681 Helicobacter pylori [H. pylori] as the cause of diseases classified elsewhere: Secondary | ICD-10-CM

## 2015-07-20 DIAGNOSIS — M545 Low back pain: Secondary | ICD-10-CM | POA: Diagnosis not present

## 2015-07-20 LAB — CBC WITH DIFFERENTIAL/PLATELET
BASOS ABS: 0 10*3/uL (ref 0.0–0.1)
Basophils Relative: 0.4 % (ref 0.0–3.0)
EOS ABS: 0 10*3/uL (ref 0.0–0.7)
Eosinophils Relative: 0.3 % (ref 0.0–5.0)
HCT: 39.1 % (ref 36.0–46.0)
Hemoglobin: 13 g/dL (ref 12.0–15.0)
LYMPHS ABS: 1.6 10*3/uL (ref 0.7–4.0)
Lymphocytes Relative: 13.1 % (ref 12.0–46.0)
MCHC: 33.4 g/dL (ref 30.0–36.0)
MCV: 84.7 fl (ref 78.0–100.0)
MONO ABS: 0.4 10*3/uL (ref 0.1–1.0)
Monocytes Relative: 3.8 % (ref 3.0–12.0)
NEUTROS ABS: 9.9 10*3/uL — AB (ref 1.4–7.7)
NEUTROS PCT: 82.4 % — AB (ref 43.0–77.0)
PLATELETS: 253 10*3/uL (ref 150.0–400.0)
RBC: 4.61 Mil/uL (ref 3.87–5.11)
RDW: 14.5 % (ref 11.5–15.5)
WBC: 12 10*3/uL — ABNORMAL HIGH (ref 4.0–10.5)

## 2015-07-20 LAB — SURGICAL PATHOLOGY

## 2015-07-20 MED ORDER — METRONIDAZOLE 500 MG PO TABS: 500.0000 mg | ORAL_TABLET | Freq: Three times a day (TID) | ORAL | Status: DC

## 2015-07-20 MED ORDER — METHYLPREDNISOLONE ACETATE 80 MG/ML IJ SUSP
80.0000 mg | Freq: Once | INTRAMUSCULAR | Status: AC
  Administered 2015-07-20: 80 mg via INTRAMUSCULAR

## 2015-07-20 MED ORDER — CLARITHROMYCIN 500 MG PO TABS: 500.0000 mg | ORAL_TABLET | Freq: Two times a day (BID) | ORAL | Status: DC

## 2015-07-20 MED ORDER — PROMETHAZINE HCL 25 MG/ML IJ SOLN
25.0000 mg | Freq: Once | INTRAMUSCULAR | Status: AC
  Administered 2015-07-20: 25 mg via INTRAMUSCULAR

## 2015-07-20 NOTE — Addendum Note (Signed)
Addended by: Jacqualin Combes on: 07/20/2015 09:18 AM   Modules accepted: Orders

## 2015-07-20 NOTE — Addendum Note (Signed)
Addended by: Ellamae Sia on: 07/20/2015 10:44 AM   Modules accepted: Orders

## 2015-07-20 NOTE — Progress Notes (Signed)
Subjective:    Patient ID: Marissa Salazar, female    DOB: 11-10-1980, 35 y.o.   MRN: [id number]  HPI  Ms. Guebara is a 35 year old female who presents today for hospital follow up.   She presented to [facility] ED on 07/15/15 with complaints of lower abdominal pain. This had been an ongoing problem which was under currently evaluation with PCP. Since no relief with Protonix and continued nausea she was admitted for observation.  During her hospitalization she underwent further testing and treatment: IV fluids, Zofran, and narcotic pain medication for her pain. CT abdomen/pelvis. She completed endoscopy on 06/09 with evidence of mild gastritis, otherwise unremarkable. Labs for H-Pylori antigen were negative. She was treated with Reglan, Carafate, Protonix. She was discharged on 07/17/15 with prescriptions for Carafate and PPI.   Since her discharge home she's continued to experience frontal headache and lower back pain. Her abdominal pain has nearly resolved. Her headache has been present for the past several weeks. She's been unable to sleep. She's taken ibuprofen, (morphine and imitrex in the hospital) without improvement. She describes her headache has pressure, she has to keep moving her body to feel better. Denies changes in vision, dizziness, photophobia.   Her back pain is located to the lower back, midline and has been present for several weeks. Denies recent injury/trauma, urinary symptoms, vaginal symptoms. She did go hiking several weeks ago and pulled off several ticks. She also continues to experience nausea, and does experience intermittent numbness to her bilateral lower extremities, more so the left lower extremity.   Review of Systems  Constitutional: Positive for fatigue. Negative for fever.  Eyes: Negative for photophobia and visual disturbance.  Respiratory: Negative for shortness of breath.   Cardiovascular: Negative for chest pain.  Gastrointestinal: Positive for nausea. Negative  for vomiting and abdominal pain.  Genitourinary:       No incontinence of bowel or bladder.  Musculoskeletal: Positive for back pain.  Neurological: Positive for numbness and headaches.       Past Medical History  Diagnosis Date  . Abnormal Pap smear     in past; normal colpo  . Allergic rhinitis   . Allergy      Social History   Social History  . Marital Status: Married    Spouse Name: N/A  . Number of Children: 1  . Years of Education: N/A   Occupational History  . Student    Social History Main Topics  . Smoking status: Never Smoker   . Smokeless tobacco: Never Used  . Alcohol Use: 0.0 oz/week    0 Standard drinks or equivalent per week     Comment: Rare  . Drug Use: No  . Sexual Activity: Yes    Birth Control/ Protection: None   Other Topics Concern  . 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 Colposcopy  2009  . Bunionectomy Right 2013  . Breast biopsy Left 06-12-14    fibroadenoma  . Esophagogastroduodenoscopy N/A 07/17/2015    Procedure: ESOPHAGOGASTRODUODENOSCOPY (EGD);  Surgeon: Manya Silvas, MD;  Location: [facility] ENDOSCOPY;  Service: Endoscopy;  Laterality: N/A;    Family History  Problem Relation Age of Onset  . Pancreatic cancer Maternal Aunt   .  Breast cancer Maternal Aunt   . Colon cancer Maternal Grandmother 37  . Breast cancer Maternal Grandmother   . Breast cancer      GP  . Lung cancer      GP  . Diabetes Paternal Uncle     Allergies  Allergen Reactions  . Ivp Dye [Iodinated Diagnostic Agents] Hives    Current Outpatient Prescriptions on File Prior to Visit  Medication Sig Dispense Refill  . Multiple Vitamin (MULTIVITAMIN) tablet Take 1 tablet by mouth daily.     No current facility-administered medications on file prior to visit.    LMP 07/01/2015    Objective:   Physical  Exam  Constitutional: She is oriented to person, place, and time. She appears well-nourished. She appears distressed.  Eyes: EOM are normal. Pupils are equal, round, and reactive to light.  Neck: Neck supple.  Cardiovascular: Normal rate and regular rhythm.   Pulmonary/Chest: Effort normal and breath sounds normal.  Abdominal: Soft. Bowel sounds are normal. There is no tenderness.  Musculoskeletal: Normal range of motion.  Negative straight leg raise.  Neurological: She is alert and oriented to person, place, and time. She displays normal reflexes. No cranial nerve deficit.  Skin: Skin is warm and dry. No rash noted.  Psychiatric:  Anxious during exam          Assessment & Plan:  Hospital Follow Up:  Admitted to [facility] on 06/07 with abdominal pain. GI work up overall unremarkable. Discharged home on 06/09.  Continues to report headache and back pain which she mentioned in hospital but doesn't feel as though it was addressed. No relief with headache despite morphine and imitrex.  She seems mildly-moderately distressed today as she is upset regarding her symptoms. Neuro exam unremarkable today. Negative straight leg raise, good ROM.  Given persistent headache, will administer IM steriods and phenergan in hopes to abort headache. Will also obtain CT head to rule out any abnormality.  Repeat CBC as it did show very mild increase in WBC count during hospitalization (will keep in mid that recent injection of IM depo may cause slight increase). Also check Lyme Disease panel given history of tick bites. No obvious s/s of Lyme Disease today.   Xray of lumbar spine today has she is experiencing radiculopathy to lower extremities.  If all testing negative, may want to further investigate anxiety as cause.  All hospital notes, imaging, labs reviewed.

## 2015-07-20 NOTE — Telephone Encounter (Signed)
Stacie from [place] [facility] called with results of CT for pt, there were no abnormal findings.

## 2015-07-20 NOTE — Patient Instructions (Addendum)
Complete lab work prior to leaving today.   Stop by the front desk and speak with either Rosaria Ferries or Ebony Hail regarding your CT scan.  Complete xray(s) prior to leaving today.   Please notify us if no improvement in your headache in 24 hours.  I will be in touch with you once I receive your results.  It was a pleasure meeting you!

## 2015-07-20 NOTE — Progress Notes (Signed)
Pre visit review using our clinic review tool, if applicable. No additional management support is needed unless otherwise documented below in the visit note. 

## 2015-07-20 NOTE — Telephone Encounter (Signed)
Noted. Patient to be notified.

## 2015-07-21 ENCOUNTER — Encounter: Payer: Self-pay | Admitting: Primary Care

## 2015-07-21 LAB — LYME AB/WESTERN BLOT REFLEX

## 2015-07-23 ENCOUNTER — Other Ambulatory Visit (INDEPENDENT_AMBULATORY_CARE_PROVIDER_SITE_OTHER): Payer: 59

## 2015-07-23 ENCOUNTER — Telehealth: Payer: Self-pay | Admitting: Family Medicine

## 2015-07-23 DIAGNOSIS — R1013 Epigastric pain: Secondary | ICD-10-CM

## 2015-07-23 DIAGNOSIS — G44001 Cluster headache syndrome, unspecified, intractable: Secondary | ICD-10-CM

## 2015-07-23 LAB — CBC WITH DIFFERENTIAL/PLATELET
BASOS ABS: 0 10*3/uL (ref 0.0–0.1)
Basophils Relative: 0.3 % (ref 0.0–3.0)
EOS ABS: 0.1 10*3/uL (ref 0.0–0.7)
EOS PCT: 0.5 % (ref 0.0–5.0)
HCT: 40.2 % (ref 36.0–46.0)
Hemoglobin: 13.4 g/dL (ref 12.0–15.0)
LYMPHS ABS: 2.1 10*3/uL (ref 0.7–4.0)
LYMPHS PCT: 18 % (ref 12.0–46.0)
MCHC: 33.3 g/dL (ref 30.0–36.0)
MCV: 84.7 fl (ref 78.0–100.0)
MONOS PCT: 4.6 % (ref 3.0–12.0)
Monocytes Absolute: 0.5 10*3/uL (ref 0.1–1.0)
NEUTROS PCT: 76.6 % (ref 43.0–77.0)
Neutro Abs: 8.8 10*3/uL — ABNORMAL HIGH (ref 1.4–7.7)
PLATELETS: 259 10*3/uL (ref 150.0–400.0)
RBC: 4.75 Mil/uL (ref 3.87–5.11)
RDW: 14.3 % (ref 11.5–15.5)
WBC: 11.5 10*3/uL — AB (ref 4.0–10.5)

## 2015-07-23 LAB — SEDIMENTATION RATE: SED RATE: 7 mm/h (ref 0–20)

## 2015-07-23 NOTE — Telephone Encounter (Addendum)
Called and notified patient that work note is ready for pick. Patient then request to faxed note.  Faxed note to [phone] Attn: Gwenette Greet

## 2015-07-23 NOTE — Telephone Encounter (Signed)
Pt is currently in office having bloodwork completed

## 2015-07-23 NOTE — Telephone Encounter (Signed)
Pt needs work note to return to work on 07/24/15.  She has been out since 07/20/15.  Pt requests CB when note ready at [phone], pt can stop by the morning of 07/24/15

## 2015-07-23 NOTE — Telephone Encounter (Signed)
Received message to call Dr. Jamal Collin about Marissa Salazar.  She is still in a lot of pain- sacral.  Rain 1/2 marathon a couple of weeks ago.  He is questioning possible sacral stress fx.  Please make an appointment with Dr. Lorelei Pont for her.  Also, I would like to check RMSF and SED rate given persistent HA.  Orders entered. Please update pt.

## 2015-07-25 LAB — ROCKY MTN SPOTTED FVR ABS PNL(IGG+IGM)
RMSF IgG: NOT DETECTED
RMSF IgM: NOT DETECTED

## 2015-07-28 ENCOUNTER — Ambulatory Visit (INDEPENDENT_AMBULATORY_CARE_PROVIDER_SITE_OTHER): Payer: 59 | Admitting: Family Medicine

## 2015-07-28 VITALS — BP 110/66 | HR 86 | Temp 98.0°F | Wt 126.0 lb

## 2015-07-28 DIAGNOSIS — D72829 Elevated white blood cell count, unspecified: Secondary | ICD-10-CM

## 2015-07-28 DIAGNOSIS — R519 Headache, unspecified: Secondary | ICD-10-CM | POA: Insufficient documentation

## 2015-07-28 DIAGNOSIS — R51 Headache: Secondary | ICD-10-CM

## 2015-07-28 DIAGNOSIS — M533 Sacrococcygeal disorders, not elsewhere classified: Secondary | ICD-10-CM

## 2015-07-28 NOTE — Assessment & Plan Note (Signed)
>  40 minutes spent in face to face time with patient, >50% spent in counselling or coordination of care discussed elevated CBC, epigastric pain, nausea, back painHA, myalgias, night sweats.  ? Possibly large proportion of symptoms related to H Pylori but that remains unclear to me. Has had a significant GI work up.  We agreed that she should finish triple therapy for H Pylori and come back to see me in 2 weeks.  Repeat CBC at that time.  The patient and her husband indicate understanding of these issues and agrees with the plan.

## 2015-07-28 NOTE — Patient Instructions (Signed)
Good to see you. Please come see me in 2 weeks.

## 2015-07-28 NOTE — Progress Notes (Signed)
Subjective:   Patient ID: Marissa Salazar, female    DOB: Jul 06, 1980, 35 y.o.   MRN: [id number]  Marissa Salazar is a pleasant 35 y.o. year old female who presents to clinic today with Follow-up  on [id number]  HPI:  Complicated recent history.  Has been followed by my partner, Allie Bossier, NP, Dr. Vira Agar and Dr. Jamal Collin for several complaints.  Admitted to [facility] for abdominal pain on 07/15/15.  Notes and studies reviewed.  CT abd/ pelvis neg. Endoscopy consistent with mild gastritis.   H pylori was negative in the hospital but in fact tested positive and treated after discharge.  Discharged home on 07/17/15 with PPI and Carafate.  Persistent headache and sacral pain.  Saw Allie Bossier on 07/20/15.  Notes reviewed. GIven IM steroids and phenergan.  WBC count mildly elevated.  Head CT neg. Lumbar spine xray negative.  SED rate, RMSF titers wnl.  Dr. Jamal Collin called to speak with me on 6/15 and ? Possible sacral stress fx.  She did run a 1/2 marathon a few weeks ago.   Still taking H pylori rxs.  Some symptoms improved- epigastric burning and nausea have improved.  Headaches less frequent. Lab Results  Component Value Date   WBC 11.5* 07/23/2015   HGB 13.4 07/23/2015   HCT 40.2 07/23/2015   MCV 84.7 07/23/2015   PLT 259.0 07/23/2015     Ct Abdomen Pelvis Wo Contrast  07/15/2015  CLINICAL DATA:  Back pain and urgency, initial encounter EXAM: CT ABDOMEN AND PELVIS WITHOUT CONTRAST TECHNIQUE: Multidetector CT imaging of the abdomen and pelvis was performed following the standard protocol without IV contrast. COMPARISON:  02/08/2015 CT of the abdomen and pelvis FINDINGS: Lung bases are free of acute infiltrate or sizable effusion. The liver, gallbladder, spleen, adrenal glands and pancreas are within normal limits. The kidneys are well visualized bilaterally without definitive renal calculi or obstructive change. No ureteral calculi are seen. The bladder is decompressed. The appendix  is within normal limits. The uterus and ovaries appear within normal limits. No pelvic mass lesion is seen. No free pelvic fluid is seen. The osseous structures show no acute abnormality. IMPRESSION: No acute abnormality to correspond with the patient's given clinical symptomatology is seen. Electronically Signed   By: Inez Catalina M.D.   On: 07/15/2015 17:04   Dg Lumbar Spine 2-3 Views  07/20/2015  CLINICAL DATA:  Back pain. EXAM: LUMBAR SPINE - 2-3 VIEW COMPARISON:  CT 07/15/2015. FINDINGS: No acute bony abnormality identified. Normal alignment mineralization. Mild scoliosis concave right. Oral contrast in the bowel from prior recent CT. Pelvic calcifications consistent phleboliths. IMPRESSION: Minimal scoliosis concave right. No acute bony abnormality identified. Electronically Signed   By: Marcello Moores  Register   On: 07/20/2015 09:37   Ct Head Wo Contrast  07/20/2015  CLINICAL DATA:  Acute non-intractable frontal headache for 2 weeks. EXAM: CT HEAD WITHOUT CONTRAST TECHNIQUE: Contiguous axial images were obtained from the base of the skull through the vertex without intravenous contrast. COMPARISON:  None. FINDINGS: No evidence of parenchymal hemorrhage or extra-axial fluid collection. No mass lesion, mass effect, or midline shift. No CT evidence of acute infarction. Cerebral volume is age appropriate. No ventriculomegaly. The visualized paranasal sinuses are essentially clear. The mastoid air cells are unopacified. No evidence of calvarial fracture. IMPRESSION: Negative head CT.  No evidence of acute intracranial abnormality. Electronically Signed   By: Ilona Sorrel M.D.   On: 07/20/2015 11:12   Dg Ugi W/small Bowel  07/10/2015  CLINICAL DATA:  Left upper quadrant pain, burning in the esophagus. Nausea. EXAM: UPPER GI SERIES WITH SMALL BOWEL FOLLOW-THROUGH FLUOROSCOPY TIME:  Radiation Exposure Index (as provided by the fluoroscopic device): 22.1 mGy TECHNIQUE: Combined double contrast and single contrast  upper GI series using effervescent crystals, thick barium, and thin barium. Subsequently, serial images of the small bowel were obtained including spot views of the terminal ileum. COMPARISON:  None. FINDINGS: Examination of the esophagus demonstrated normal esophageal motility. Normal esophageal morphology without evidence of esophagitis or ulceration. No esophageal stricture, diverticula, or mass lesion. No evidence of hiatal hernia. There is moderate gastroesophageal reflux into the mid thoracic esophagus. Examination of the stomach demonstrated normal rugal folds and areae gastricae. The gastric mucosa appeared unremarkable without evidence of ulceration, scarring, or mass lesion. Gastric motility and emptying was normal. Fluoroscopic examination of the duodenum demonstrates normal motility and morphology without evidence of ulceration or mass lesion. Medium density barium was periodically observed under fluoroscopy to travel from the stomach to the ascending colon (over a 45 minute time period). There is no evidence of small bowel stricture or obstruction. No large filling defects to suggest mass lesion. In addition, there is no evidence of tethering or definite inflammatory changes present within the small bowel. IMPRESSION: 1. Moderate gastroesophageal reflux into the mid thoracic esophagus. 2. Normal small bowel follow-through. Electronically Signed   By: Kathreen Devoid   On: 07/10/2015 13:35   US Breast Complete [place]  07/14/2015  CORE BREAST BIOPSY REPORT Name:  Marissa Salazar DOB:  15-Sep-1980  Lesion:  mass Location:    Left   2o'clock position 5cmfn Local anesthetic:   8 ml of 1% Xylocaine/0.5% Marcaine Prep:  Chloro Prep Device:  14 Gauge   Bard   Ultrasound guidance:  used Patient tolerance:  Patient tolerated the procedure well Approach: LM Clip: placed Dressing: steri strips, telfa tegaderm Ice pack applied. Written instructions provided to patient regarding wound care. Patient advised  that patient will be contacted by phone when pathology report is available. Followup appointment to be scheduled after pathology. CC: @PCP @, Arther Abbott G   US Abdomen Limited [place]  06/30/2015  CLINICAL DATA:  Abdominal pain for 1 week, abnormal laboratory values EXAM: US ABDOMEN LIMITED - RIGHT UPPER QUADRANT COMPARISON:  CT abdomen and pelvis of 02/08/2015 FINDINGS: Gallbladder: The gallbladder is somewhat contracted. No gallstones are seen and there is no pain over the gallbladder with compression. Common bile duct: Diameter: The common bile duct is normal measuring 2.2 mm in diameter. Liver: The liver has a normal echogenic pattern. A small echogenic focus in the right lobe measures 7 mm in diameter, most consistent with a small hepatic hemangioma. IMPRESSION: 1. No gallstones.  The gallbladder is somewhat contracted. 2. Probable 7 mm hepatic hemangioma in the right lobe. Electronically Signed   By: Ivar Drape M.D.   On: 06/30/2015 14:24     Current Outpatient Prescriptions on File Prior to Visit  Medication Sig Dispense Refill  . clarithromycin (BIAXIN) 500 MG tablet Take 1 tablet (500 mg total) by mouth 2 (two) times daily. 28 tablet 0  . Multiple Vitamin (MULTIVITAMIN) tablet Take 1 tablet by mouth daily.     No current facility-administered medications on file prior to visit.    Allergies  Allergen Reactions  . Ivp Dye [Iodinated Diagnostic Agents] Hives    Past Medical History  Diagnosis Date  . Abnormal Pap smear     in past; normal colpo  .  Allergic rhinitis   . Allergy     Past Surgical History  Procedure Laterality Date  . Colposcopy  2009  . Bunionectomy Right 2013  . Breast biopsy Left 06-12-14    fibroadenoma  . Esophagogastroduodenoscopy N/A 07/17/2015    Procedure: ESOPHAGOGASTRODUODENOSCOPY (EGD);  Surgeon: Manya Silvas, MD;  Location: [facility] ENDOSCOPY;  Service: Endoscopy;  Laterality: N/A;    Family History  Problem Relation Age of Onset  .  Pancreatic cancer Maternal Aunt   . Breast cancer Maternal Aunt   . Colon cancer Maternal Grandmother 16  . Breast cancer Maternal Grandmother   . Breast cancer      GP  . Lung cancer      GP  . Diabetes Paternal Uncle     Social History   Social History  . Marital Status: Married    Spouse Name: N/A  . Number of Children: 1  . Years of Education: N/A   Occupational History  . Student    Social History Main Topics  . Smoking status: Never Smoker   . Smokeless tobacco: Never Used  . Alcohol Use: 0.0 oz/week    0 Standard drinks or equivalent per week     Comment: Rare  . Drug Use: No  . Sexual Activity: Yes    Birth Control/ Protection: None   Other Topics Concern  . Not on file   Social History Narrative   Married      [name]; 43 y/o son      Ship broker; CMA program      Regular exercise         The PMH, PSH, Social History, Family History, Medications, and allergies have been reviewed in [facility], and have been updated if relevant.    Review of Systems  Constitutional: Positive for appetite change. Negative for fever.       + night sweats  HENT: Negative.   Eyes: Negative.   Respiratory: Negative.   Cardiovascular: Negative.   Gastrointestinal: Positive for nausea, abdominal pain and diarrhea.  Genitourinary: Negative.   Musculoskeletal: Positive for back pain.  Skin: Negative.   Allergic/Immunologic: Negative.   Neurological: Negative.   Hematological: Positive for adenopathy.  Psychiatric/Behavioral: Negative.   All other systems reviewed and are negative.      Objective:    BP 110/66 mmHg  Pulse 86  Temp(Src) 98 F (36.7 C) (Oral)  Wt 126 lb (57.153 kg)  SpO2 99%  LMP 07/06/2015  Wt Readings from Last 3 Encounters:  07/28/15 126 lb (57.153 kg)  07/20/15 127 lb 1.9 oz (57.661 kg)  07/15/15 130 lb (58.968 kg)    Physical Exam  Constitutional: She is oriented to person, place, and time. She appears well-developed and well-nourished. No  distress.  HENT:  Head: Normocephalic.  Eyes: Conjunctivae are normal.  Cardiovascular: Normal rate and regular rhythm.   Pulmonary/Chest: Effort normal.  Musculoskeletal: Normal range of motion.  Lymphadenopathy:    She has no cervical adenopathy.  Neurological: She is alert and oriented to person, place, and time. No cranial nerve deficit.  Skin: Skin is warm and dry. She is not diaphoretic.  Psychiatric: She has a normal mood and affect. Her behavior is normal. Judgment and thought content normal.  Nursing note and vitals reviewed.         Assessment & Plan:   Headache, unspecified headache type  Sacral pain  Elevated white blood cell count No Follow-up on file.

## 2015-07-28 NOTE — Progress Notes (Signed)
Pre visit review using our clinic review tool, if applicable. No additional management support is needed unless otherwise documented below in the visit note. 

## 2015-08-04 ENCOUNTER — Encounter: Payer: Self-pay | Admitting: Family Medicine

## 2015-08-04 ENCOUNTER — Other Ambulatory Visit: Payer: Self-pay | Admitting: Family Medicine

## 2015-08-04 MED ORDER — TRAZODONE HCL 50 MG PO TABS: 25.0000 mg | ORAL_TABLET | Freq: Every evening | ORAL | Status: DC | PRN

## 2015-08-10 ENCOUNTER — Encounter: Payer: Self-pay | Admitting: Family Medicine

## 2015-08-10 ENCOUNTER — Ambulatory Visit (INDEPENDENT_AMBULATORY_CARE_PROVIDER_SITE_OTHER): Payer: 59 | Admitting: Family Medicine

## 2015-08-10 VITALS — BP 100/68 | HR 65 | Temp 98.4°F | Ht 64.0 in | Wt 126.2 lb

## 2015-08-10 DIAGNOSIS — M533 Sacrococcygeal disorders, not elsewhere classified: Secondary | ICD-10-CM

## 2015-08-10 MED ORDER — NORTRIPTYLINE HCL 25 MG PO CAPS: 25.0000 mg | ORAL_CAPSULE | Freq: Every day | ORAL | Status: DC

## 2015-08-10 NOTE — Patient Instructions (Signed)

## 2015-08-10 NOTE — Progress Notes (Signed)
Dr. Frederico Hamman T. Nealy Hickmon, MD, [facility] [place], [address] Phone: [phone] Fax: [phone]  08/10/2015  Patient: Marissa Salazar, MRN: [id number], DOB: [birth year], 35 y.o.  Primary Physician:  Arnette Norris, MD   Chief Complaint  Patient presents with  . Back Pain    Low Back x 1 month-pain goes from dull to stabbing   Subjective:   Marissa Salazar is a 35 y.o. very pleasant female patient who presents with the following:  Patient of Dr. Deborra Medina  Low back pain and sacrum and coccyx.  Recent H. Pylori infection.   Ran a half-marathon in April, then fine for 2 weeks in April.  Does not hurt to sit.  Previously, she had been running 2 or 3 times a week, 10 miles at a time. She actually had been training for a full marathon.  She has been having a lot of GI issues, H. pylori infection, diarrhea without constipation. She denies any issues with gynecological pain or changes. She has been having some increased urgency, and took a round of antibiotics.  Prior to this, she had no issues or pain with her coccyx  She is having some occasional pain at night, she is also having some tingling in and around this region occasionally.  Past Medical History, Surgical History, Social History, Family History, Problem List, Medications, and Allergies have been reviewed and updated if relevant.  Patient Active Problem List   Diagnosis Date Noted  . Headache 07/28/2015  . Sacral pain 07/28/2015  . Elevated white blood cell count 07/28/2015  . Epigastric pain 07/15/2015  . Esophageal reflux 07/09/2015  . Family history of breast cancer in female 05/28/2014    Past Medical History  Diagnosis Date  . Abnormal Pap smear     in past; normal colpo  . Allergic rhinitis   . Allergy     Past Surgical History  Procedure Laterality Date  . Colposcopy  2009  . Bunionectomy Right 2013  . Breast biopsy Left 06-12-14    fibroadenoma  .  Esophagogastroduodenoscopy N/A 07/17/2015    Procedure: ESOPHAGOGASTRODUODENOSCOPY (EGD);  Surgeon: Manya Silvas, MD;  Location: [facility] ENDOSCOPY;  Service: Endoscopy;  Laterality: N/A;    Social History   Social History  . Marital Status: Married    Spouse Name: N/A  . Number of Children: 1  . Years of Education: N/A   Occupational History  . Student    Social History Main Topics  . Smoking status: Never Smoker   . Smokeless tobacco: Never Used  . Alcohol Use: 0.0 oz/week    0 Standard drinks or equivalent per week     Comment: Rare  . Drug Use: No  . Sexual Activity: Yes    Birth Control/ Protection: None   Other Topics Concern  . 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 Pancreatic cancer Maternal Aunt   . Breast cancer Maternal Aunt   . Colon cancer Maternal Grandmother 77  . Breast cancer Maternal Grandmother   . Breast cancer      GP  . Lung cancer      GP  . Diabetes Paternal Uncle  Allergies  Allergen Reactions  . Ivp Dye [Iodinated Diagnostic Agents] Hives    Medication list reviewed and updated in full in [facility].  GEN: No fevers, chills. Nontoxic. Primarily MSK c/o today. MSK: Detailed in the HPI GI: tolerating PO intake without difficulty Neuro: No numbness, parasthesias, or tingling associated. Otherwise the pertinent positives of the ROS are noted above.   Objective:   BP 100/68 mmHg  Pulse 65  Temp(Src) 98.4 F (36.9 C) (Oral)  Ht 5\' 4"  (1.626 m)  Wt 126 lb 4 oz (57.267 kg)  BMI 21.66 kg/m2  LMP 08/03/2015   GEN: Well-developed,well-nourished,in no acute distress; alert,appropriate and cooperative throughout examination HEENT: Normocephalic and atraumatic without obvious abnormalities. Ears, externally no deformities PULM: Breathing comfortably in no respiratory distress EXT: No  clubbing, cyanosis, or edema PSYCH: Normally interactive. Cooperative during the interview. Pleasant. Friendly and conversant. Not anxious or depressed appearing. Normal, full affect.  Range of motion at  the waist: Flexion: normal Extension: normal Lateral bending: normal Rotation: all normal  No echymosis or edema Rises to examination table with no difficulty Gait: non antalgic  Inspection/Deformity: N Paraspinus Tenderness: none  B Ankle Dorsiflexion (L5,4): 5/5 B Great Toe Dorsiflexion (L5,4): 5/5 Heel Walk (L5): WNL Toe Walk (S1): WNL Rise/Squat (L4): WNL  SENSORY B Medial Foot (L4): WNL B Dorsum (L5): WNL B Lateral (S1): WNL Light Touch: WNL Pinprick: WNL  REFLEXES Knee (L4): 2+ Ankle (S1): 2+  B SLR, seated: neg B SLR, supine: neg B FABER: neg B Reverse FABER: neg B Greater Troch: NT B Log Roll: neg B Stork: NT B Sciatic Notch: NT   Sacrum and coccyx evaluation: This sacrum is nontender in its entirety. The coccyx is notably tender throughout its entirety and also had its distal tip. Surrounding soft tissue is nontender. All muscles in the posterior pelvis are nontender. Piriformis is nontender. Quadratus is nontender. Gluteus medius appears to be nontender. This portion of the physical examination was chaperoned by Hedy Camara, CMA.   Radiology: CLINICAL DATA: Back pain.  EXAM: LUMBAR SPINE - 2-3 VIEW  COMPARISON: CT 07/15/2015.  FINDINGS: No acute bony abnormality identified. Normal alignment mineralization. Mild scoliosis concave right. Oral contrast in the bowel from prior recent CT. Pelvic calcifications consistent phleboliths.  IMPRESSION: Minimal scoliosis concave right. No acute bony abnormality identified.   Electronically Signed  By: Marcello Moores Register  On: 07/20/2015 09:37  Results for orders placed or performed in visit on 07/23/15  Rocky mtn spotted fvr abs pnl(IgG+IgM)  Result Value Ref Range   RMSF IgG NOT DETECTED  NOT DETECTED   RMSF IgM NOT DETECTED NOT DETECTED  Sedimentation Rate  Result Value Ref Range   Sed Rate 7 0 - 20 mm/hr  CBC with Differential/Platelet  Result Value Ref Range   WBC 11.5 (H) 4.0 - 10.5 K/uL   RBC 4.75 3.87 - 5.11 Mil/uL   Hemoglobin 13.4 12.0 - 15.0 g/dL   HCT 40.2 36.0 - 46.0 %   MCV 84.7 78.0 - 100.0 fl   MCHC 33.3 30.0 - 36.0 g/dL   RDW 14.3 11.5 - 15.5 %   Platelets 259.0 150.0 - 400.0 K/uL   Neutrophils Relative % 76.6 43.0 - 77.0 %   Lymphocytes Relative 18.0 12.0 - 46.0 %   Monocytes Relative 4.6 3.0 - 12.0 %   Eosinophils Relative 0.5 0.0 - 5.0 %   Basophils Relative 0.3 0.0 - 3.0 %   Neutro Abs 8.8 (H) 1.4 - 7.7 K/uL  Lymphs Abs 2.1 0.7 - 4.0 K/uL   Monocytes Absolute 0.5 0.1 - 1.0 K/uL   Eosinophils Absolute 0.1 0.0 - 0.7 K/uL   Basophils Absolute 0.0 0.0 - 0.1 K/uL     Assessment and Plan:   Coccydynia - Plan: Ambulatory referral to Physical Therapy  Notable isolated coccydynia. No trauma or injury. Remote trauma about 5 years ago, but no pain at that time. No pain after recent half marathon.  Ongoing GI and genitourinary issues and urgency as well as diarrhea.  Coccydynia can be insulin was by multiple organ systems including GI, GU, as well as gynecological. I do not think that the patient has any lumbar spine pathology. This appears to be isolated to the coccyx.  I'm going to do a trial of a neuropathic pain agent to see if this will help with some of the occasional tingling and pain that she has.  Physical therapy for rehabilitation, coccyx and sacral manipulation, and possible pelvic floor rehabilitation. Sometimes pelvic floor dysfunction can also contribute.  I appreciate the opportunity to evaluate this very friendly patient. If you have any question regarding her care or prognosis, do not hesitate to ask.   Follow-up: Return in about 1 month (around 09/10/2015).  New Prescriptions   NORTRIPTYLINE (PAMELOR) 25 MG CAPSULE    Take 1  capsule (25 mg total) by mouth at bedtime.   Orders Placed This Encounter  Procedures  . Ambulatory referral to Physical Therapy    Signed,  Frederico Hamman T. Kinley Dozier, MD   Patient's Medications  New Prescriptions   NORTRIPTYLINE (PAMELOR) 25 MG CAPSULE    Take 1 capsule (25 mg total) by mouth at bedtime.  Previous Medications   MULTIPLE VITAMIN (MULTIVITAMIN) TABLET    Take 1 tablet by mouth daily.   PANTOPRAZOLE (PROTONIX) 40 MG TABLET    Take 40 mg by mouth daily.  Modified Medications   No medications on file  Discontinued Medications   LACTOBACILLUS (PROBIOTIC ACIDOPHILUS PO)    Take 1 tablet by mouth daily.   METRONIDAZOLE (FLAGYL) 500 MG TABLET    Take 500 mg by mouth 3 (three) times daily.   TRAZODONE (DESYREL) 50 MG TABLET    Take 0.5-1 tablets (25-50 mg total) by mouth at bedtime as needed for sleep.

## 2015-08-10 NOTE — Progress Notes (Signed)
Pre visit review using our clinic review tool, if applicable. No additional management support is needed unless otherwise documented below in the visit note. 

## 2015-08-12 ENCOUNTER — Ambulatory Visit (INDEPENDENT_AMBULATORY_CARE_PROVIDER_SITE_OTHER): Payer: 59 | Admitting: Family Medicine

## 2015-08-12 VITALS — BP 112/72 | HR 74 | Temp 97.7°F | Wt 127.0 lb

## 2015-08-12 DIAGNOSIS — D72829 Elevated white blood cell count, unspecified: Secondary | ICD-10-CM

## 2015-08-12 DIAGNOSIS — A048 Other specified bacterial intestinal infections: Secondary | ICD-10-CM | POA: Insufficient documentation

## 2015-08-12 DIAGNOSIS — G44099 Other trigeminal autonomic cephalgias (TAC), not intractable: Secondary | ICD-10-CM

## 2015-08-12 DIAGNOSIS — B9681 Helicobacter pylori [H. pylori] as the cause of diseases classified elsewhere: Secondary | ICD-10-CM

## 2015-08-12 DIAGNOSIS — K219 Gastro-esophageal reflux disease without esophagitis: Secondary | ICD-10-CM

## 2015-08-12 DIAGNOSIS — R350 Frequency of micturition: Secondary | ICD-10-CM | POA: Diagnosis not present

## 2015-08-12 DIAGNOSIS — M533 Sacrococcygeal disorders, not elsewhere classified: Secondary | ICD-10-CM

## 2015-08-12 LAB — COMPREHENSIVE METABOLIC PANEL
ALT: 19 U/L (ref 0–35)
AST: 24 U/L (ref 0–37)
Albumin: 4.1 g/dL (ref 3.5–5.2)
Alkaline Phosphatase: 33 U/L — ABNORMAL LOW (ref 39–117)
BUN: 13 mg/dL (ref 6–23)
CHLORIDE: 102 meq/L (ref 96–112)
CO2: 29 meq/L (ref 19–32)
Calcium: 9.2 mg/dL (ref 8.4–10.5)
Creatinine, Ser: 0.69 mg/dL (ref 0.40–1.20)
GFR: 104.15 mL/min (ref >60.00)
GLUCOSE: 89 mg/dL (ref 70–99)
POTASSIUM: 3.8 meq/L (ref 3.5–5.1)
SODIUM: 135 meq/L (ref 135–145)
Total Bilirubin: 0.4 mg/dL (ref 0.2–1.2)
Total Protein: 7.1 g/dL (ref 6.0–8.3)

## 2015-08-12 LAB — CBC WITH DIFFERENTIAL/PLATELET
BASOS PCT: 0.4 % (ref 0.0–3.0)
Basophils Absolute: 0 10*3/uL (ref 0.0–0.1)
EOS PCT: 1.6 % (ref 0.0–5.0)
Eosinophils Absolute: 0.1 10*3/uL (ref 0.0–0.7)
HEMATOCRIT: 38.1 % (ref 36.0–46.0)
HEMOGLOBIN: 12.8 g/dL (ref 12.0–15.0)
Lymphocytes Relative: 31.2 % (ref 12.0–46.0)
Lymphs Abs: 2.3 10*3/uL (ref 0.7–4.0)
MCHC: 33.7 g/dL (ref 30.0–36.0)
MCV: 84.9 fl (ref 78.0–100.0)
MONO ABS: 0.5 10*3/uL (ref 0.1–1.0)
MONOS PCT: 6.9 % (ref 3.0–12.0)
Neutro Abs: 4.3 10*3/uL (ref 1.4–7.7)
Neutrophils Relative %: 59.9 % (ref 43.0–77.0)
Platelets: 213 10*3/uL (ref 150.0–400.0)
RBC: 4.48 Mil/uL (ref 3.87–5.11)
RDW: 15.3 % (ref 11.5–15.5)
WBC: 7.2 10*3/uL (ref 4.0–10.5)

## 2015-08-12 LAB — URINALYSIS, MICROSCOPIC ONLY
RBC / HPF: NONE SEEN (ref 0–?)
WBC UA: NONE SEEN (ref 0–?)

## 2015-08-12 NOTE — Assessment & Plan Note (Signed)
Being treated for Coccydynia by my partner, Dr. Lorelei Pont. Has not yet started nortryptline but she will.

## 2015-08-12 NOTE — Progress Notes (Signed)
Subjective:   Patient ID: Marissa Salazar, female    DOB: 28-Mar-1980, 35 y.o.   MRN: [id number]  Marissa Salazar is a pleasant 35 y.o. year old female who presents to clinic today with Follow-up and Urinary Frequency  on 08/12/2015  HPI:  Saw her on [id number]- recent complicated medical history.  Multiple complaints and thorough work up. Did test pos for H Pylori. Just finished course of triple therapy.  Saw my partner, Dr. Lorelei Pont, on 08/10/15 for sacral pain.  Note reviewed- felt symptoms consistent with coccydynia and placed on nortriptyline. PT for pelvic floor rehabilitation and follow up with him in 1 month.  She has not yet started nortriptyline.  Wants to wait until after her GI appointment next week.  Her other symptoms are all "MUCH better."  Appetite is slowly improving.  Has stopped losing weight. Current Outpatient Prescriptions on File Prior to Visit  Medication Sig Dispense Refill  . Multiple Vitamin (MULTIVITAMIN) tablet Take 1 tablet by mouth daily.    . pantoprazole (PROTONIX) 40 MG tablet Take 40 mg by mouth daily.    . nortriptyline (PAMELOR) 25 MG capsule Take 1 capsule (25 mg total) by mouth at bedtime. (Patient not taking: Reported on 08/12/2015) 30 capsule 3   No current facility-administered medications on file prior to visit.    Allergies  Allergen Reactions  . Ivp Dye [Iodinated Diagnostic Agents] Hives    Past Medical History  Diagnosis Date  . Abnormal Pap smear     in past; normal colpo  . Allergic rhinitis   . Allergy     Past Surgical History  Procedure Laterality Date  . Colposcopy  2009  . Bunionectomy Right 2013  . Breast biopsy Left 06-12-14    fibroadenoma  . Esophagogastroduodenoscopy N/A 07/17/2015    Procedure: ESOPHAGOGASTRODUODENOSCOPY (EGD);  Surgeon: Manya Silvas, MD;  [facility]: [facility] ENDOSCOPY;  Service: Endoscopy;  Laterality: N/A;    Family History  Problem Relation Age of Onset  . Pancreatic cancer Maternal Aunt   .  Breast cancer Maternal Aunt   . Colon cancer Maternal Grandmother 65  . Breast cancer Maternal Grandmother   . Breast cancer      GP  . Lung cancer      GP  . Diabetes Paternal Uncle     Social History   Social History  . Marital Status: Married    Spouse Name: N/A  . Number of Children: 1  . Years of Education: N/A   Occupational History  . Student    Social History Main Topics  . Smoking status: Never Smoker   . Smokeless tobacco: Never Used  . Alcohol Use: 0.0 oz/week    0 Standard drinks or equivalent per week     Comment: Rare  . Drug Use: No  . Sexual Activity: Yes    Birth Control/ Protection: None   Other Topics Concern  . Not on file   Social History Narrative   Married      [name]; 20 y/o son      Ship broker; CMA program      Regular exercise         The PMH, PSH, Social History, Family History, Medications, and allergies have been reviewed in [facility], and have been updated if relevant.    Review of Systems  Constitutional: Negative.   Eyes: Negative.   Respiratory: Negative.   Cardiovascular: Negative.   Gastrointestinal: Negative.   Genitourinary: Negative.   Musculoskeletal: Positive for back  pain and arthralgias.  Psychiatric/Behavioral: Negative.   All other systems reviewed and are negative.      Objective:    BP 112/72 mmHg  Pulse 74  Temp(Src) 97.7 F (36.5 C) (Oral)  Wt 127 lb (57.607 kg)  SpO2 98%  LMP 08/03/2015   Physical Exam  Constitutional: She is oriented to person, place, and time. She appears well-developed and well-nourished. No distress.  HENT:  Head: Normocephalic.  Eyes: Conjunctivae are normal.  Cardiovascular: Normal rate.   Pulmonary/Chest: Effort normal.  Musculoskeletal: Normal range of motion.  Neurological: She is alert and oriented to person, place, and time. No cranial nerve deficit.  Skin: Skin is warm and dry. She is not diaphoretic.  Psychiatric: She has a normal mood and affect. Her behavior is  normal. Judgment and thought content normal.  Nursing note and vitals reviewed.   Wt Readings from Last 3 Encounters:  08/12/15 127 lb (57.607 kg)  08/10/15 126 lb 4 oz (57.267 kg)  07/28/15 126 lb (57.153 kg)         Assessment & Plan:   Elevated white blood cell count  Sacral pain  Other trigeminal autonomic cephalgia (TAC), not intractable  Gastroesophageal reflux disease, esophagitis presence not specified No Follow-up on file.

## 2015-08-12 NOTE — Assessment & Plan Note (Signed)
Repeat CBC. Likely due to active H Pylori infection.

## 2015-08-12 NOTE — Addendum Note (Signed)
Addended by: Daralene Milch C on: 08/12/2015 01:50 PM   Modules accepted: Miquel Dunn

## 2015-08-12 NOTE — Progress Notes (Signed)
Pre visit review using our clinic review tool, if applicable. No additional management support is needed unless otherwise documented below in the visit note. 

## 2015-08-12 NOTE — Assessment & Plan Note (Signed)
S/p triple therapy. Keep appt with GI next week.

## 2015-08-12 NOTE — Patient Instructions (Signed)
Great to see you.  I will call with your results from today.

## 2015-08-13 ENCOUNTER — Encounter: Payer: Self-pay | Admitting: Family Medicine

## 2015-08-14 DIAGNOSIS — R1013 Epigastric pain: Secondary | ICD-10-CM | POA: Diagnosis not present

## 2015-08-14 DIAGNOSIS — B9681 Helicobacter pylori [H. pylori] as the cause of diseases classified elsewhere: Secondary | ICD-10-CM | POA: Diagnosis not present

## 2015-08-14 DIAGNOSIS — K2970 Gastritis, unspecified, without bleeding: Secondary | ICD-10-CM | POA: Diagnosis not present

## 2015-09-07 ENCOUNTER — Encounter: Payer: Self-pay | Admitting: Family Medicine

## 2015-09-07 ENCOUNTER — Other Ambulatory Visit: Payer: Self-pay | Admitting: Family Medicine

## 2015-09-07 MED ORDER — CYCLOBENZAPRINE HCL 5 MG PO TABS: 5.0000 mg | ORAL_TABLET | Freq: Three times a day (TID) | ORAL | 1 refills | Status: DC | PRN

## 2015-10-08 ENCOUNTER — Other Ambulatory Visit: Payer: Self-pay | Admitting: Family Medicine

## 2015-10-08 ENCOUNTER — Encounter: Payer: Self-pay | Admitting: Family Medicine

## 2015-10-08 DIAGNOSIS — N644 Mastodynia: Secondary | ICD-10-CM

## 2015-10-09 ENCOUNTER — Telehealth: Payer: Self-pay | Admitting: Family Medicine

## 2015-10-09 ENCOUNTER — Other Ambulatory Visit: Payer: Self-pay | Admitting: Internal Medicine

## 2015-10-09 DIAGNOSIS — N644 Mastodynia: Secondary | ICD-10-CM

## 2015-10-09 NOTE — Telephone Encounter (Signed)
Pt wants to go [facility] [place] They need orders changed to  img [phone] for mammogram img [phone] for limited ultra sound thanks

## 2015-10-09 NOTE — Telephone Encounter (Signed)
Orders entered

## 2015-10-15 ENCOUNTER — Encounter: Payer: Self-pay | Admitting: Family Medicine

## 2015-10-16 ENCOUNTER — Ambulatory Visit
Admission: RE | Admit: 2015-10-16 | Discharge: 2015-10-16 | Disposition: A | Discharge status: Home / Self Care | Payer: 59 | Source: Ambulatory Visit | Attending: Internal Medicine | Admitting: Internal Medicine

## 2015-10-16 ENCOUNTER — Other Ambulatory Visit: Payer: Self-pay | Admitting: Internal Medicine

## 2015-10-16 ENCOUNTER — Other Ambulatory Visit: Payer: Self-pay | Admitting: General Surgery

## 2015-10-16 DIAGNOSIS — N644 Mastodynia: Secondary | ICD-10-CM

## 2015-10-16 DIAGNOSIS — N6489 Other specified disorders of breast: Secondary | ICD-10-CM | POA: Diagnosis not present

## 2015-10-16 DIAGNOSIS — N6320 Unspecified lump in the left breast, unspecified quadrant: Secondary | ICD-10-CM

## 2015-10-16 DIAGNOSIS — N63 Unspecified lump in breast: Secondary | ICD-10-CM | POA: Diagnosis not present

## 2015-10-19 DIAGNOSIS — Z8619 Personal history of other infectious and parasitic diseases: Secondary | ICD-10-CM | POA: Diagnosis not present

## 2015-10-22 ENCOUNTER — Other Ambulatory Visit: Payer: Self-pay | Admitting: Family Medicine

## 2015-10-22 DIAGNOSIS — N6320 Unspecified lump in the left breast, unspecified quadrant: Secondary | ICD-10-CM

## 2015-10-23 ENCOUNTER — Telehealth: Payer: Self-pay | Admitting: Family Medicine

## 2015-10-23 ENCOUNTER — Other Ambulatory Visit: Payer: Self-pay

## 2015-10-23 NOTE — Telephone Encounter (Signed)
Please call pt to get specific details of her reaction.

## 2015-10-23 NOTE — Telephone Encounter (Signed)
Royston Cowper is calling regarding the patients exemption paperwork for the flu shot.  He stated that they need clarification on what kind of reaction she has had in the past which would allow her to be exempt.  He needs specific details like itching, swelling, etc.  Please call him at [phone]

## 2015-10-26 NOTE — Telephone Encounter (Signed)
I strongly discourage that decision.  If a patient says they had a reaction to ANYTHING, we need to err on the side of caution and NOT put the patient at risk.  Will route to [facility] for her assistance.

## 2015-10-26 NOTE — Telephone Encounter (Signed)
Spoke to employee health, who states they are unfortunately unable to accept the pts advice of her reaction. She states this is not considered clinical documentation and does not disqualify her from receiving the flu shot. I read the message from the pt, to employee health which outlined her reaction and denied any anaphylactic reaction. Pt will still have to receive immunization

## 2015-11-02 ENCOUNTER — Other Ambulatory Visit: Payer: Self-pay

## 2015-11-04 NOTE — Telephone Encounter (Signed)
I spoke directly with [employer] nurse, [place], regarding patient's symptoms, which mainly consisted of GI response and fever.  Based on the fact that symptoms were primarily of a GI nature and do not constitute a true, genuine allergic reaction, [facility] decision is that patient should NOT be exempt from the employee flu shot requirement.  I spoke directly to Ms. Pickeral regarding decision and she verbalized understanding and thanks Dr.Aron and staff for investigating this further.

## 2015-12-17 ENCOUNTER — Encounter: Payer: Self-pay | Admitting: Family Medicine

## 2016-02-12 ENCOUNTER — Other Ambulatory Visit: Payer: Self-pay | Admitting: Obstetrics and Gynecology

## 2016-02-12 DIAGNOSIS — Z6822 Body mass index (BMI) 22.0-22.9, adult: Secondary | ICD-10-CM | POA: Diagnosis not present

## 2016-02-12 DIAGNOSIS — Z124 Encounter for screening for malignant neoplasm of cervix: Secondary | ICD-10-CM | POA: Diagnosis not present

## 2016-02-12 DIAGNOSIS — Z01419 Encounter for gynecological examination (general) (routine) without abnormal findings: Secondary | ICD-10-CM | POA: Diagnosis not present

## 2016-02-12 LAB — HM PAP SMEAR: HM Pap smear: NEGATIVE

## 2016-02-15 LAB — CYTOLOGY - PAP

## 2016-02-16 ENCOUNTER — Encounter: Payer: Self-pay | Admitting: Family Medicine

## 2016-03-05 IMAGING — US US TRANSVAGINAL NON-OB
1 series · 14 of 25 positions shown · non-contrast
Comparison: None.

CLINICAL DATA: One-day history of left lower quadrant pain.

EXAM:
TRANSABDOMINAL AND TRANSVAGINAL ULTRASOUND OF PELVIS
DOPPLER ULTRASOUND OF OVARIES
TECHNIQUE: Both transabdominal and transvaginal ultrasound examinations of the
pelvis were performed. Transabdominal technique was performed for
global imaging of the pelvis including uterus, ovaries, adnexal
regions, and pelvic cul-de-sac.
It was necessary to proceed with endovaginal exam following the
transabdominal exam to visualize the ovaries. Color and duplex
Doppler ultrasound was utilized to evaluate blood flow to the
ovaries.

[Series 1: us transvaginal non-ob · 0.23mm/px · 14 of 67 slices shown]
[im 1/67]
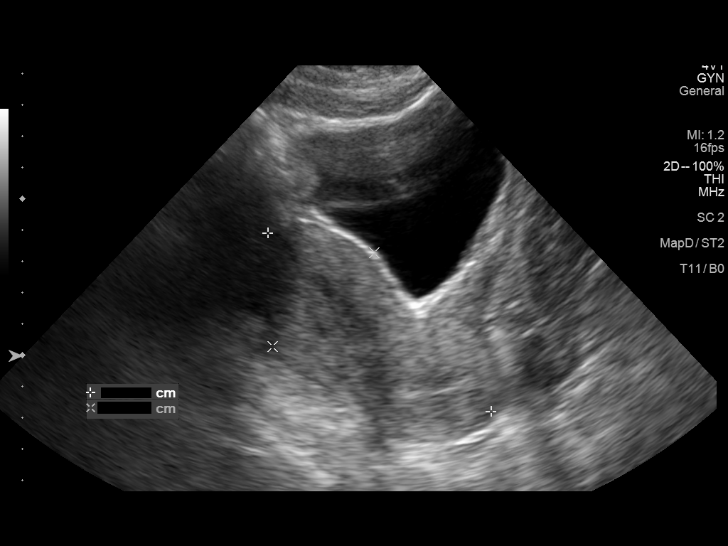
[im 6/67]
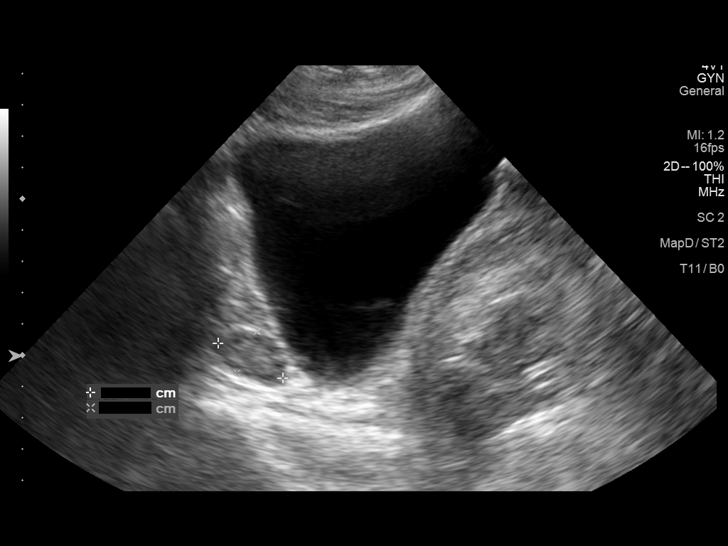
[im 12/67]
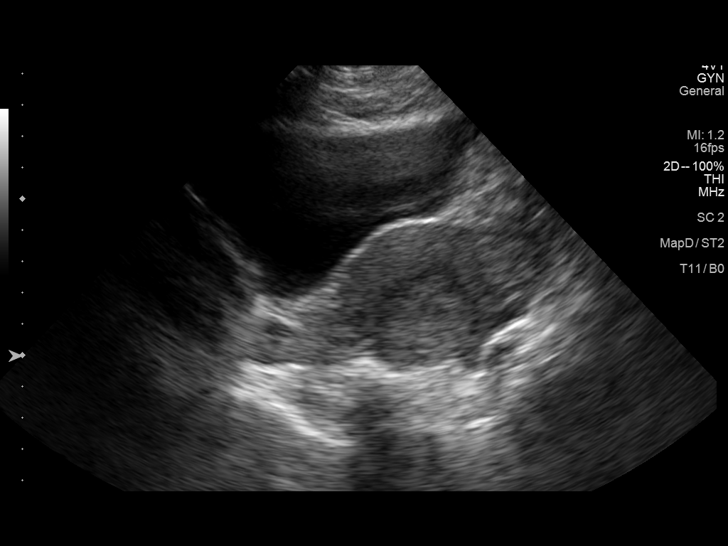
[im 17/67]
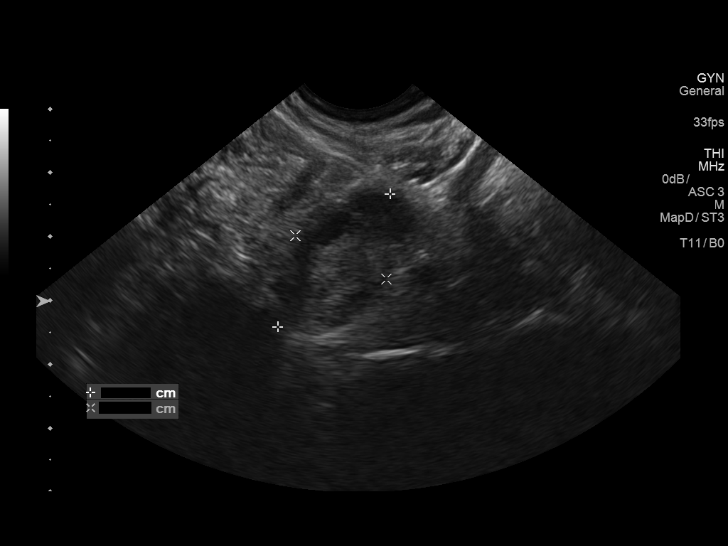
[im 23/67]
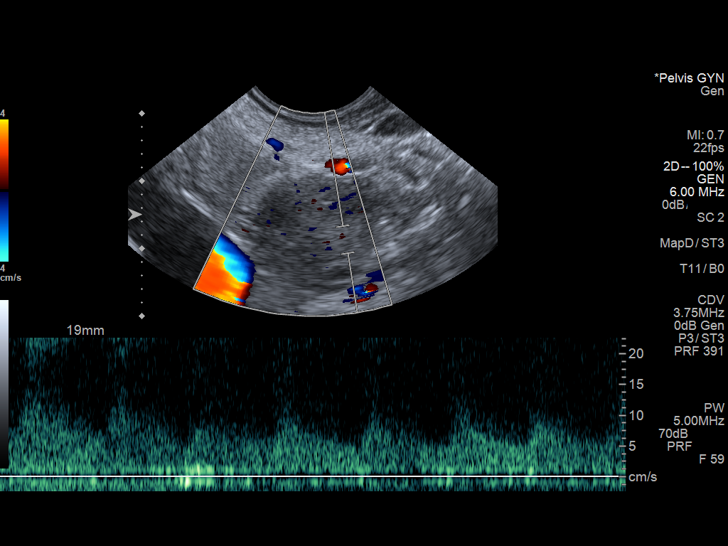
[im 25/67]
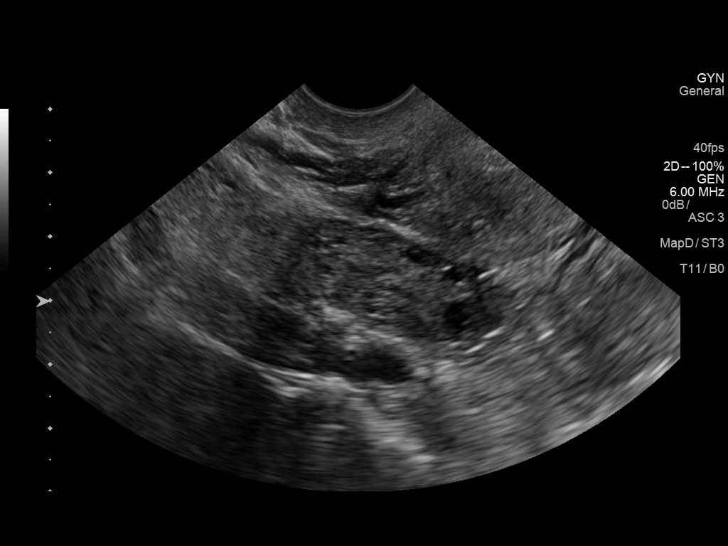
[im 31/67]
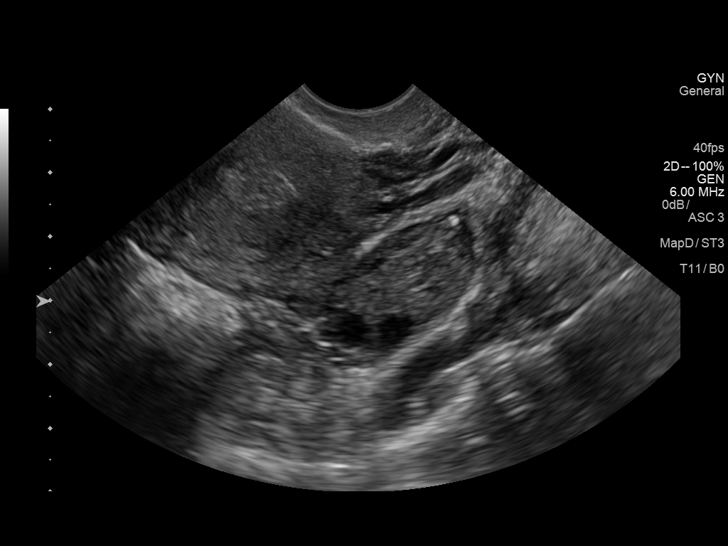
[im 36/67]
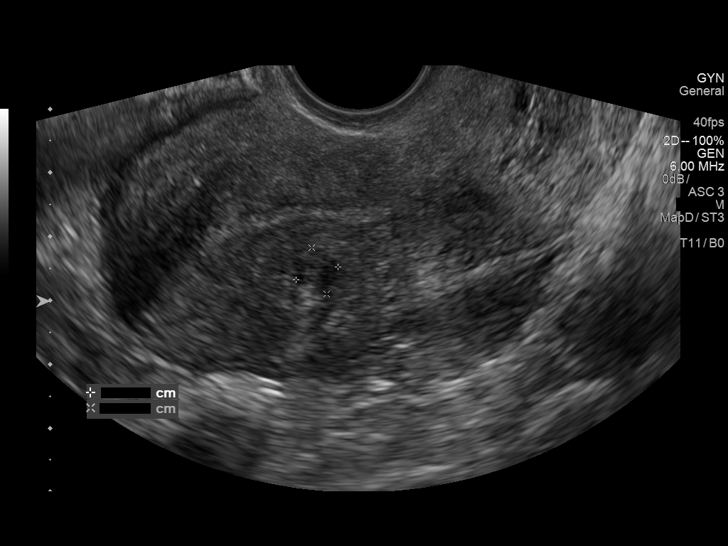
[im 42/67]
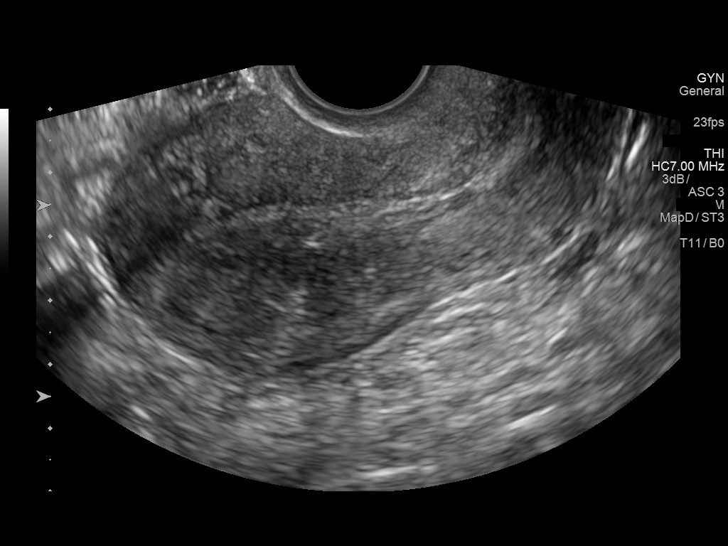
[im 45/67]
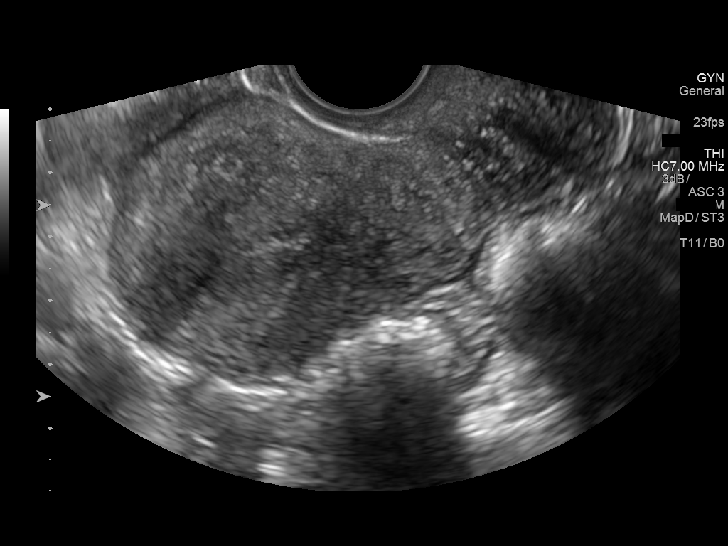
[im 50/67]
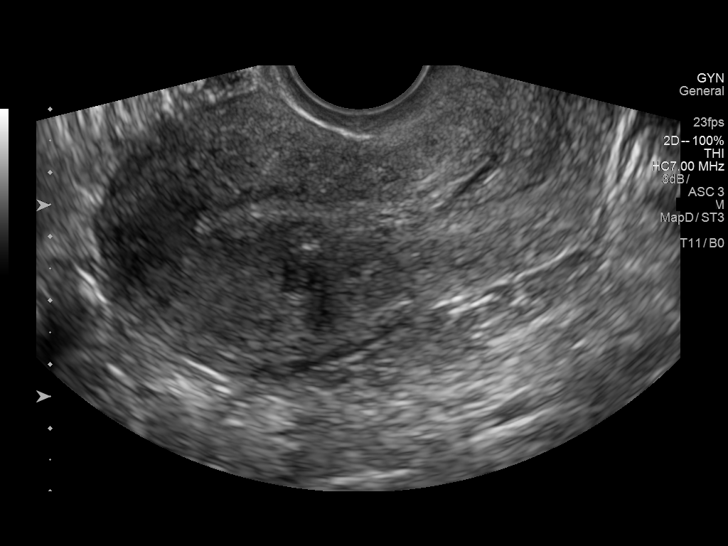
[im 56/67]
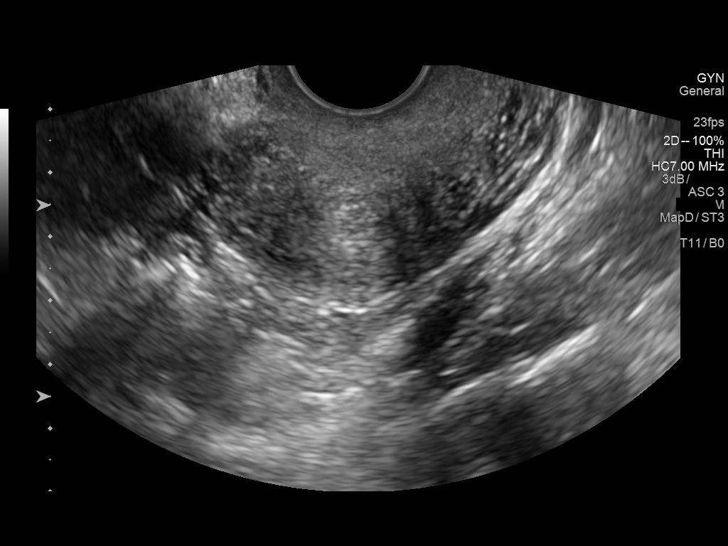
[im 61/67]
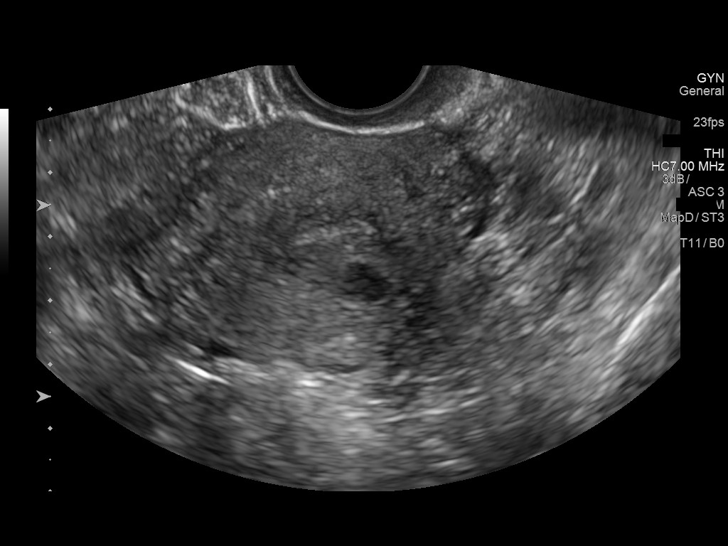
[im 67/67]
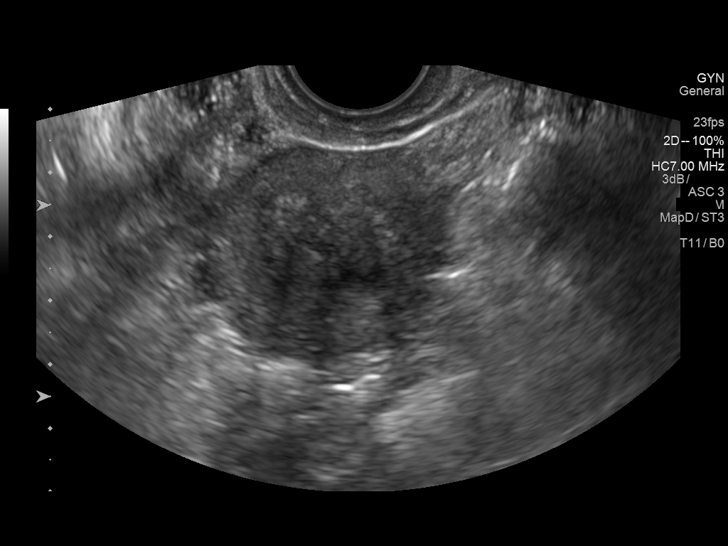

[14 of 25 positions shown; findings below may reference images not displayed]

FINDINGS: Uterus

Measurements: 9.1 x 4.4 x 4.5 cm. 8 mm intramural posterior fibroid
evident.

Endometrium

Thickness: 5 mm.  No focal abnormality visualized.

Right ovary

Measurements: 2.7 x 1.6 x 2.0 cm. Normal appearance/no adnexal mass.

Left ovary

Measurements: 3.5 x 1.5 x 1.9 cm. Normal appearance/no adnexal mass.

Pulsed Doppler evaluation of both ovaries demonstrates normal
low-resistance arterial and venous waveforms.

Other findings

No abnormal free fluid.
IMPRESSION: Small uterine fibroid.  Otherwise normal exam.

## 2016-04-04 ENCOUNTER — Ambulatory Visit: Payer: Self-pay | Admitting: Physician Assistant

## 2016-04-04 ENCOUNTER — Encounter: Payer: Self-pay | Admitting: Physician Assistant

## 2016-04-04 VITALS — BP 110/74 | HR 67 | Temp 98.3°F

## 2016-04-04 DIAGNOSIS — J189 Pneumonia, unspecified organism: Secondary | ICD-10-CM

## 2016-04-04 DIAGNOSIS — J181 Lobar pneumonia, unspecified organism: Principal | ICD-10-CM

## 2016-04-04 MED ORDER — LEVOFLOXACIN 500 MG PO TABS: 500.0000 mg | ORAL_TABLET | Freq: Every day | ORAL | 0 refills | Status: DC

## 2016-04-04 MED ORDER — ALBUTEROL SULFATE HFA 108 (90 BASE) MCG/ACT IN AERS: 2.0000 | INHALATION_SPRAY | Freq: Four times a day (QID) | RESPIRATORY_TRACT | 0 refills | Status: DC | PRN

## 2016-04-04 NOTE — Progress Notes (Signed)
S: C/o cough and congestion for 7 days, + fever, chills, last week, kids had the flu and then she got it, denies cp/sob, v/d; mucus was green this am but clear throughout the day, cough is sporadic, states the cough is worse and feels like something is in her lower lung  Using otc meds: mucinex  O: PE: vitals wnl, nad, perrl eomi, normocephalic, tms dull, nasal mucosa red and swollen, throat injected, neck supple no lymph, lungs with rhonchi and consolidation in r lower lung, cv rrr, neuro intact  A:  Acute pneumonia r lower lung   P: drink fluids, continue regular meds , use otc meds of choice, return if not improving in 5 days, return earlier if worsening , levaquin 500mg  qd, albuterol inhaler

## 2016-04-06 IMAGING — MG MM DIAG BREAST TOMO BILATERAL
8 of 12 series · 8 of 32 positions shown · non-contrast
Comparison: Baseline exam

CLINICAL DATA: Palpable abnormality in the left breast. The patient
reports diffuse left breast tenderness, occasional breast itchiness,
and pain extends into the left arm. Family history of breast cancer
diagnosed in a postmenopausal maternal aunt and postmenopausal
maternal grandmother.

EXAM:
DIGITAL DIAGNOSTIC BILATERAL MAMMOGRAM WITH 3D TOMOSYNTHESIS WITH
CAD
ULTRASOUND LEFT BREAST

[L TAN]
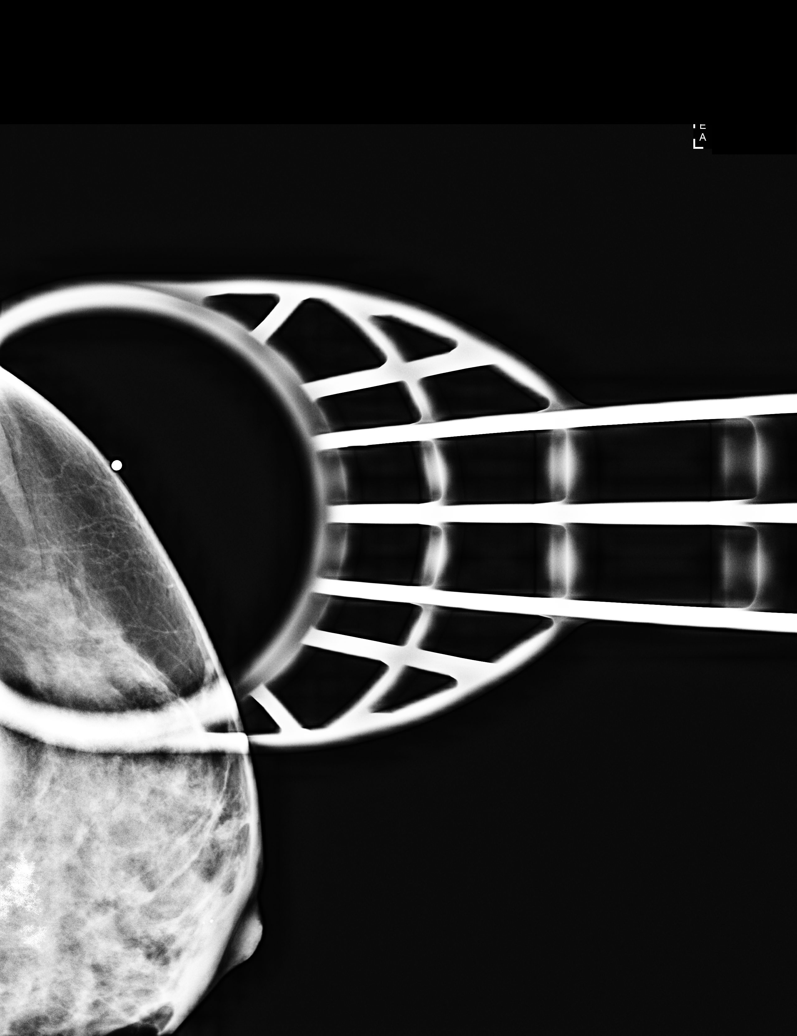

[R MLO]
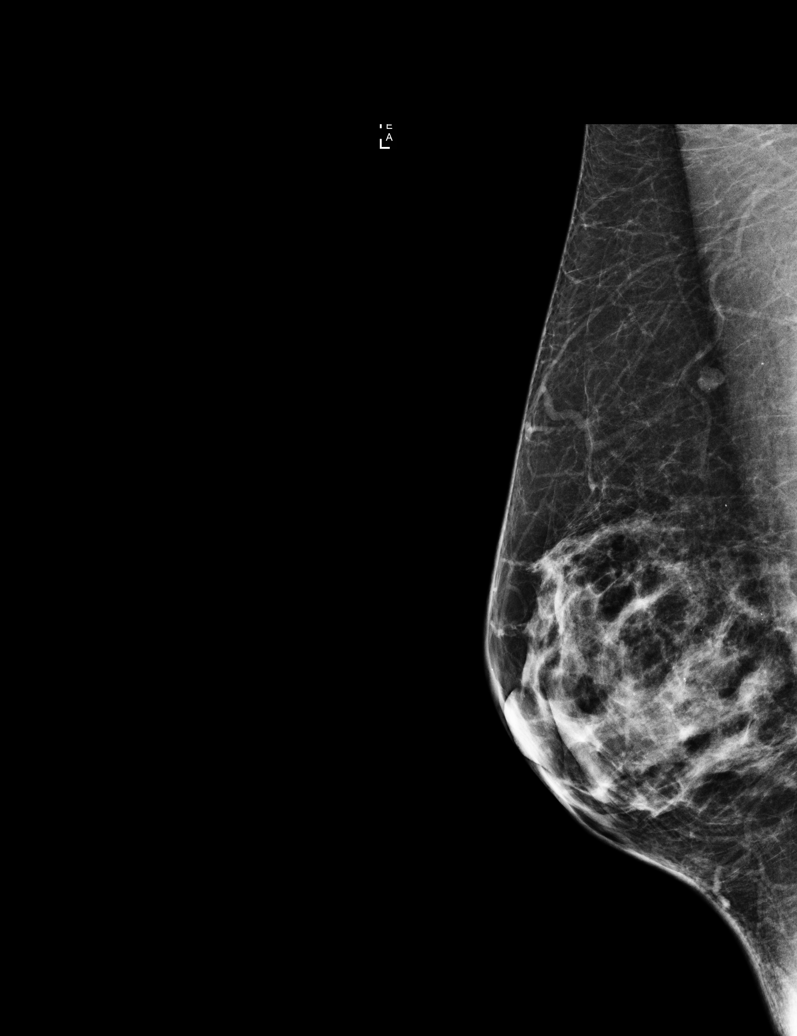

[L MLO]
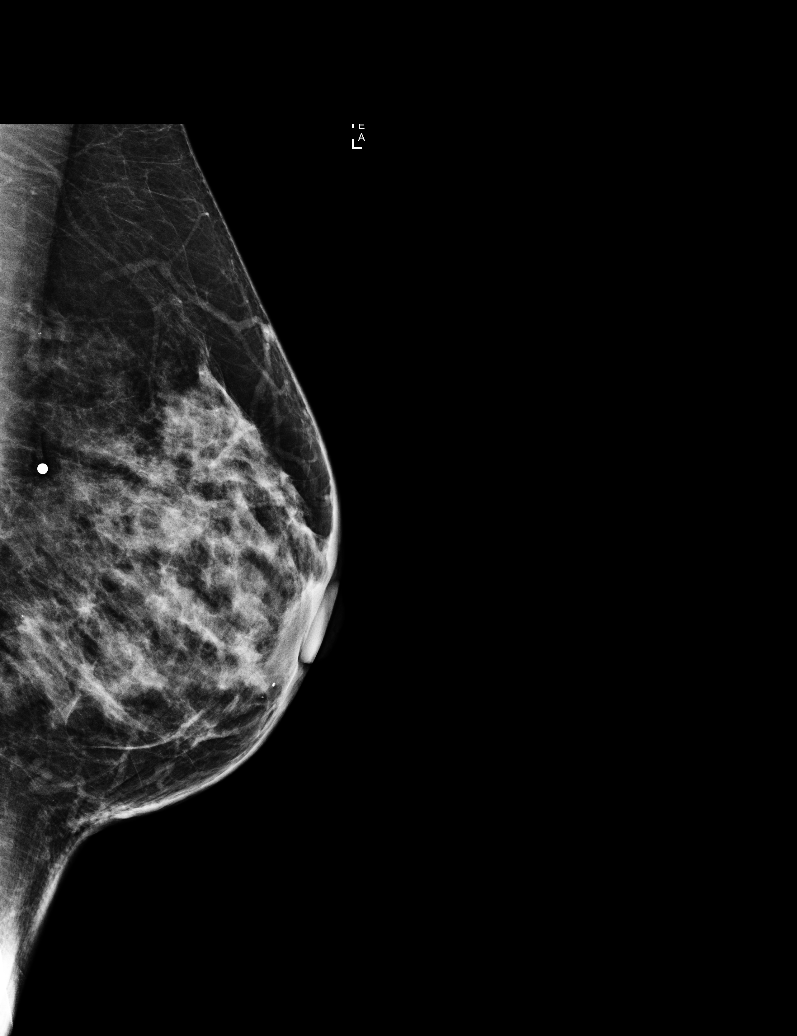

[L CC]
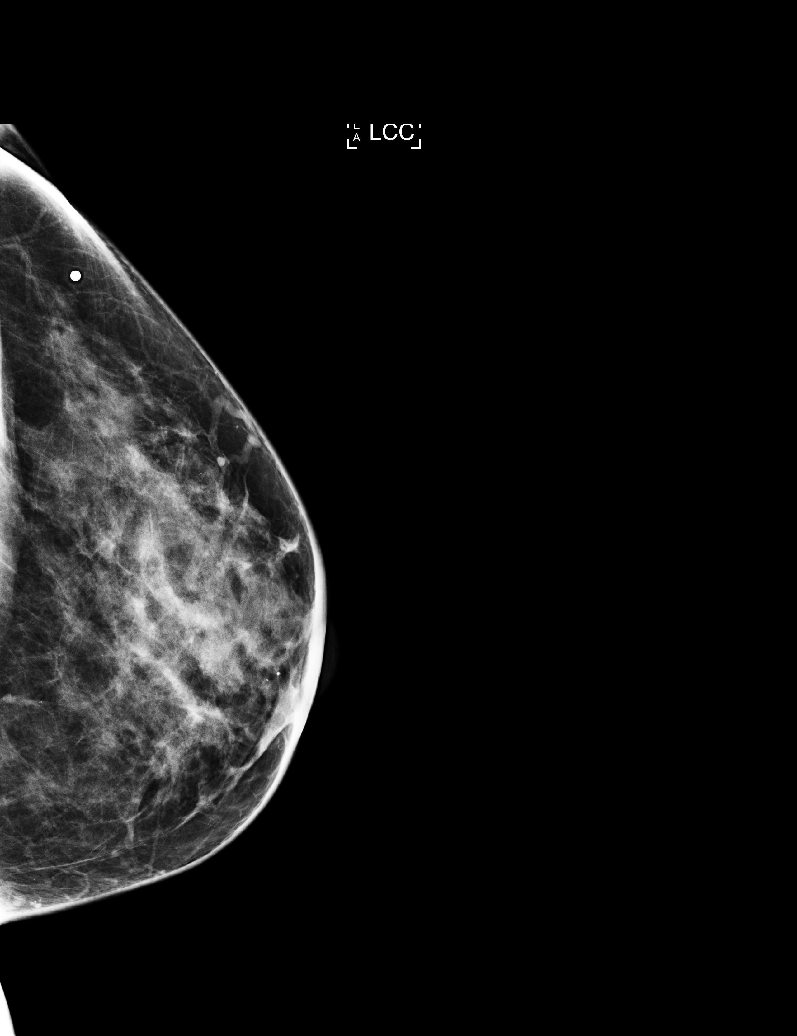

[R CC]
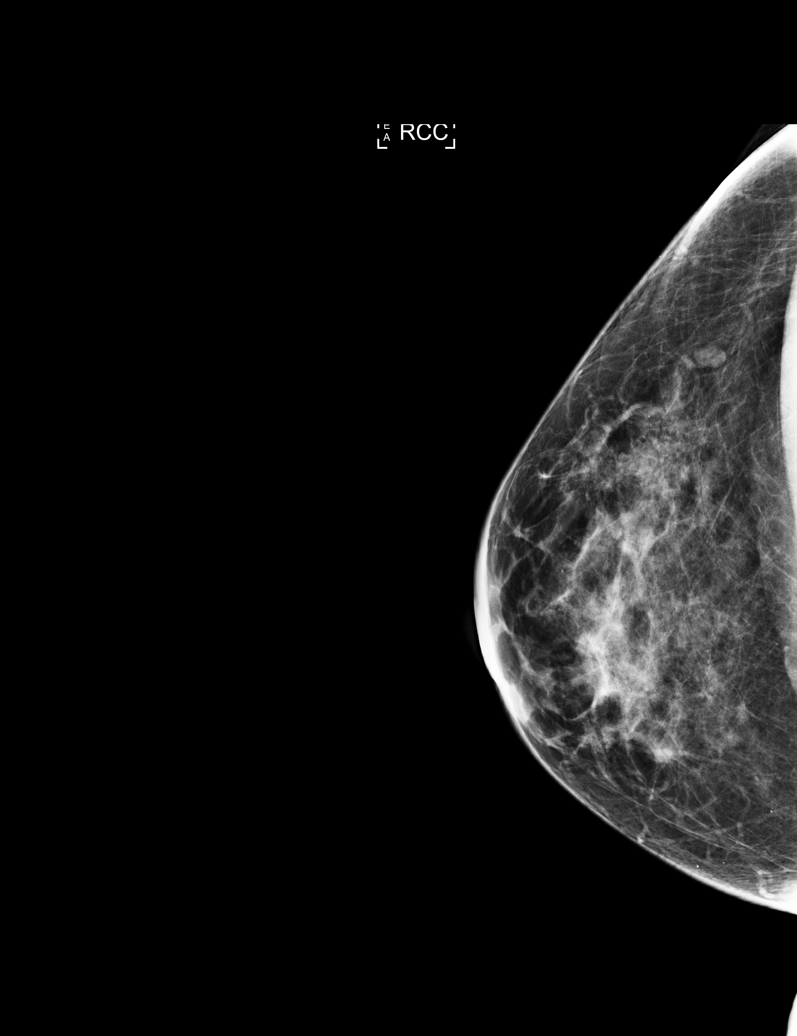

[R CC tomo]
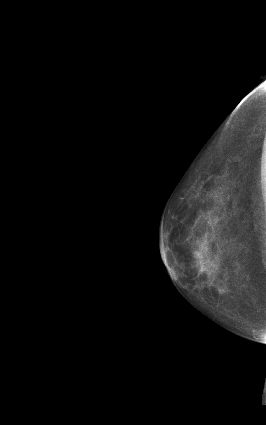

[L CC tomo (1 of 2)]
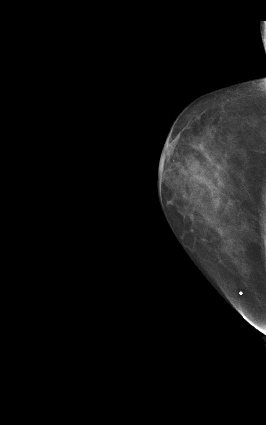

[L CC tomo (2 of 2) · tomo slice 29/57.0]
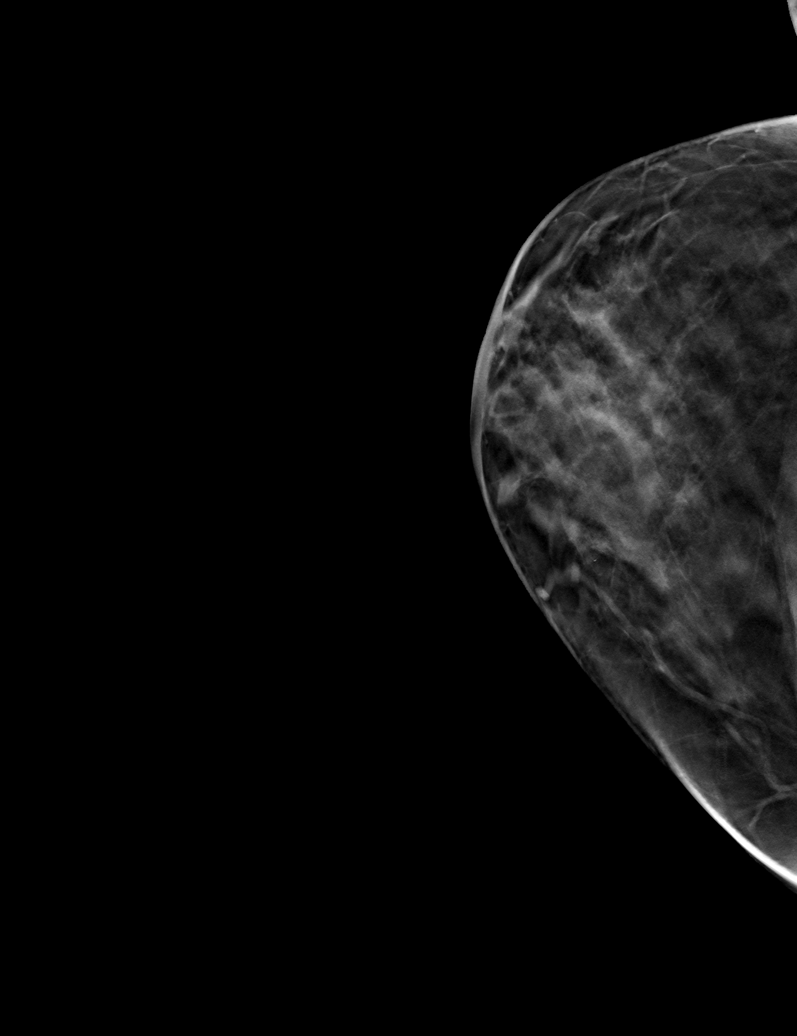

[8 of 32 positions shown; findings below may reference images not displayed]

ACR Breast Density Category c: The breast tissue is heterogeneously
dense, which may obscure small masses.
FINDINGS: Within the upper-outer quadrant of the left breast there is a
circumscribed but partially obscured oval mass further evaluated
with ultrasound. In the area of patient's concern in the lateral
central portion of the left breast, fibroglandular tissue is imaged,
not associated with mass or distortion. The right breast is
negative.

Mammographic images were processed with CAD.

On physical exam, I palpate soft nodularity in the lateral portion
of the left breast in the area of patient's concern. I palpate no
abnormality closer to the left nipple to correlate with the
mammographic circumscribed mass.

Targeted ultrasound is performed, showing multiple anechoic cysts
within the 2 o'clock location of the left breast 6 cm from the
nipple consistent with fibrocystic changes/ apocrine metaplasia. In
the 2 o'clock location 2 cm from the nipple there is an oval
circumscribed hypoechoic mass with increased through transmission
measuring 1.0 x 1.2 x 0.6 cm. Evaluation of the left axilla is
negative for adenopathy.
IMPRESSION: 1. Palpable abnormality corresponds to an area of probable
fibrocystic change/apocrine metaplasia. Followup is recommended.
2. Probable fibroadenoma in the 2 o'clock location of the left
breast incidentally detected. We discussed management options
including excision, biopsy, and close follow-up. Imaging followup is
recommended at 6, 12, and 24 months to assess stability. The patient
concurs with this plan.

RECOMMENDATION:
Left breast ultrasound is recommended in 6 months to assess
stability of fibrocystic change of probable fibroadenoma.

I have discussed the findings and recommendations with the patient.
Results were also provided in writing at the conclusion of the
visit. If applicable, a reminder letter will be sent to the patient
regarding the next appointment.

BI-RADS CATEGORY  3: Probably benign.

## 2016-04-21 ENCOUNTER — Encounter: Payer: Self-pay | Admitting: General Surgery

## 2016-04-21 ENCOUNTER — Ambulatory Visit (INDEPENDENT_AMBULATORY_CARE_PROVIDER_SITE_OTHER): Payer: 59 | Admitting: General Surgery

## 2016-04-21 ENCOUNTER — Inpatient Hospital Stay: Payer: Self-pay

## 2016-04-21 VITALS — BP 110/70 | HR 66 | Resp 12 | Ht 64.0 in | Wt 133.0 lb

## 2016-04-21 DIAGNOSIS — N6320 Unspecified lump in the left breast, unspecified quadrant: Secondary | ICD-10-CM

## 2016-04-21 DIAGNOSIS — D242 Benign neoplasm of left breast: Secondary | ICD-10-CM

## 2016-04-21 DIAGNOSIS — N6002 Solitary cyst of left breast: Secondary | ICD-10-CM

## 2016-04-21 NOTE — Patient Instructions (Addendum)
The patient is aware to call back for any questions or concerns. Plan excision left breast mass with Same Day Surgery

## 2016-04-21 NOTE — Progress Notes (Signed)
Patient ID: Marissa Salazar, female   DOB: 1980/05/28, 36 y.o.   MRN: [id number]  Chief Complaint  Patient presents with  . Other    left breast mass    HPI Marissa Salazar is a 36 y.o. female here today for a left breast ultrasound. She states it has not changed and is more noticeable with her menstrual period.   HPI  Past Medical History:  Diagnosis Date  . Abnormal Pap smear    in past; normal colpo  . Allergic rhinitis   . Allergy     Past Surgical History:  Procedure Laterality Date  . BREAST BIOPSY Left 06-12-14   fibroadenoma  . BUNIONECTOMY Right 2013  . COLPOSCOPY  2009  . ESOPHAGOGASTRODUODENOSCOPY N/A 07/17/2015   Procedure: ESOPHAGOGASTRODUODENOSCOPY (EGD);  Surgeon: Manya Silvas, MD;  Location: [facility] ENDOSCOPY;  Service: Endoscopy;  Laterality: N/A;    Family History  Problem Relation Age of Onset  . Pancreatic cancer Maternal Aunt   . Breast cancer Maternal Aunt   . Colon cancer Maternal Grandmother 23  . Breast cancer Maternal Grandmother   . Breast cancer      GP  . Lung cancer      GP  . Diabetes Paternal Uncle     Social History Social History  Substance Use Topics  . Smoking status: Never Smoker  . Smokeless tobacco: Never Used  . Alcohol use 0.0 oz/week     Comment: Rare    Allergies  Allergen Reactions  . Ivp Dye [Iodinated Diagnostic Agents] Hives    Current Outpatient Prescriptions  Medication Sig Dispense Refill  . Multiple Vitamin (MULTIVITAMIN) tablet Take 1 tablet by mouth daily.     No current facility-administered medications for this visit.     Review of Systems Review of Systems  Constitutional: Negative.   Respiratory: Negative.   Cardiovascular: Negative.     Blood pressure 110/70, pulse 66, resp. rate 12, height 5\' 4"  (1.626 m), weight 133 lb (60.3 kg), last menstrual period 04/19/2016.  Physical Exam Physical Exam  Constitutional: She is oriented to person, place, and time. She appears well-developed and  well-nourished.  HENT:  Mouth/Throat: Oropharynx is clear and moist.  Eyes: Conjunctivae are normal. No scleral icterus.  Neck: Neck supple.  Cardiovascular: Normal rate, regular rhythm and normal heart sounds.   Pulmonary/Chest: Effort normal and breath sounds normal. Right breast exhibits no inverted nipple, no mass, no nipple discharge, no skin change and no tenderness. Left breast exhibits no inverted nipple, no mass, no nipple discharge, no skin change and no tenderness.  Abdominal: Soft. There is no tenderness.  Lymphadenopathy:    She has no cervical adenopathy.    She has no axillary adenopathy.  Neurological: She is alert and oriented to person, place, and time.  Skin: Skin is warm and dry.  Psychiatric: Her behavior is normal.    Data Reviewed Prior notes reviewed  Left breast US was performed. At 2 ocl 7cmfn there is a stable irregular shaped mass with hypo and anechoic areas. It measures max of 1.46cm- stable from prior study At 2-3 ocl 3cmfn prior biopsied fibroadenoma is seen with max size of 2.2 cm. This has increased from 1.23 cm at time of initial biopsy Small cysts seen in superior left breast Assessment    Left breast cyst and fibroadenoma.    Plan     The mass in far lateral left breast appears stable in size buth the more medial fibroadenoma appears notably larger  Discussed excision of this mass and biopsy of the lateral mass. Option of removal with Encor  Device. She will think about this and decide  This information has been scribed by Karie Fetch RN, BSN,BC.   Maurie Musco G 04/22/2016, 8:24 AM

## 2016-04-26 ENCOUNTER — Encounter: Payer: Self-pay | Admitting: General Surgery

## 2016-04-26 ENCOUNTER — Inpatient Hospital Stay: Payer: Self-pay

## 2016-04-26 ENCOUNTER — Ambulatory Visit (INDEPENDENT_AMBULATORY_CARE_PROVIDER_SITE_OTHER): Payer: 59 | Admitting: General Surgery

## 2016-04-26 VITALS — BP 110/68 | HR 66 | Resp 12 | Ht 64.0 in | Wt 133.0 lb

## 2016-04-26 DIAGNOSIS — N6012 Diffuse cystic mastopathy of left breast: Secondary | ICD-10-CM | POA: Diagnosis not present

## 2016-04-26 DIAGNOSIS — N6320 Unspecified lump in the left breast, unspecified quadrant: Secondary | ICD-10-CM | POA: Diagnosis not present

## 2016-04-26 DIAGNOSIS — D242 Benign neoplasm of left breast: Secondary | ICD-10-CM | POA: Diagnosis not present

## 2016-04-26 NOTE — Progress Notes (Signed)
Patient ID: Marissa Salazar, female   DOB: May 17, 1980, 36 y.o.   MRN: [id number]  Chief Complaint  Patient presents with  . Procedure    HPI Marissa Salazar is a 36 y.o. female here today for a left breast biopsy.The patient has decided to proceed to vacuum biopsy rather than open excision.  HPI  Past Medical History:  Diagnosis Date  . Abnormal Pap smear    in past; normal colpo  . Allergic rhinitis   . Allergy     Past Surgical History:  Procedure Laterality Date  . BREAST BIOPSY Left 06-12-14   fibroadenoma  . BUNIONECTOMY Right 2013  . COLPOSCOPY  2009  . ESOPHAGOGASTRODUODENOSCOPY N/A 07/17/2015   Procedure: ESOPHAGOGASTRODUODENOSCOPY (EGD);  Surgeon: Manya Silvas, MD;  Location: [facility] ENDOSCOPY;  Service: Endoscopy;  Laterality: N/A;    Family History  Problem Relation Age of Onset  . Pancreatic cancer Maternal Aunt   . Breast cancer Maternal Aunt   . Colon cancer Maternal Grandmother 83  . Breast cancer Maternal Grandmother   . Breast cancer      GP  . Lung cancer      GP  . Diabetes Paternal Uncle     Social History Social History  Substance Use Topics  . Smoking status: Never Smoker  . Smokeless tobacco: Never Used  . Alcohol use 0.0 oz/week     Comment: Rare    Allergies  Allergen Reactions  . Ivp Dye [Iodinated Diagnostic Agents] Hives    Current Outpatient Prescriptions  Medication Sig Dispense Refill  . Multiple Vitamin (MULTIVITAMIN) tablet Take 1 tablet by mouth daily.     No current facility-administered medications for this visit.     Review of Systems Review of Systems  Blood pressure 110/68, pulse 66, resp. rate 12, height 5\' 4"  (1.626 m), weight 133 lb (60.3 kg), last menstrual period 04/19/2016.  Physical Exam Physical Exam  Pulmonary/Chest:      Data Reviewed Previous mammograms, ultrasound and biopsy results of an reviewed.  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    Plan    Options for excision were reviewed. Patient was amenable to proceed to vacuum biopsy.  In the upper-outer quadrant of the left breast at the 2:00 position, 7 cm from the nipple multiple hypoechoic nodules with posterior enhancement suggestive of microcyst were appreciated. This area measured 0.78 x 1.16 cm. No increased vascular flow was noted. At the 3:00 position, 3 cm from the nipple a multilobulated hypoechoic mass with posterior acoustic enhancement measuring 0.88 x 1.2 x 1.76 cm was identified. This is site of her previous core biopsy showing fibroadenoma. BI-RADS-4.  The patient was prepped with alcohol and a total of 16 mL of 0.5% Xylocaine with 0.25% Marcaine with 1-200,000 units of epinephrine were used through the procedure. This was well tolerated.  The upper-outer quadrant area was prepped with ChloraPrep and draped. A radial incision was made and the lesion was approached from antiradial view with a 10-gauge Encore device. 8 core samples were obtained with complete removal of the area. A postbiopsy clip was placed, (Nitrol wire circle). No bleeding was noted.  Attention was turned to the lesion at the 3:00 position. This was approached through an oblique incision again after ChloraPrep application. The 10-gauge Encore device was again advanced under ultrasound guidance to the lesion and with approximately 9 samples was completely removed. A  postbiopsy clip was placed.  No bleeding was noted. Skin incisions were closed with benzoin and Steri-Strips followed by Telfa and Tegaderm dressing.  Post procedure wound care instructions were reviewed with the patient by the staff.  She'll be contacted when pathology results are available.              Robert Bellow 04/27/2016, 9:38 PM

## 2016-04-26 NOTE — Patient Instructions (Signed)

## 2016-04-28 ENCOUNTER — Encounter: Payer: Self-pay | Admitting: General Surgery

## 2016-05-05 ENCOUNTER — Ambulatory Visit: Payer: 59 | Admitting: Family Medicine

## 2016-05-05 ENCOUNTER — Ambulatory Visit: Payer: Self-pay | Admitting: Physician Assistant

## 2016-05-05 VITALS — BP 120/82 | HR 77 | Temp 98.4°F

## 2016-05-05 DIAGNOSIS — N898 Other specified noninflammatory disorders of vagina: Secondary | ICD-10-CM

## 2016-05-05 LAB — POCT URINALYSIS DIPSTICK
BILIRUBIN UA: NEGATIVE
Blood, UA: NEGATIVE
Glucose, UA: NEGATIVE
KETONES UA: NEGATIVE
NITRITE UA: NEGATIVE
PH UA: 6 (ref 5.0–8.0)
Protein, UA: NEGATIVE
Spec Grav, UA: 1.01 (ref 1.030–1.035)
Urobilinogen, UA: 0.2 (ref ?–2.0)

## 2016-05-05 MED ORDER — FLUCONAZOLE 150 MG PO TABS: 150.0000 mg | ORAL_TABLET | Freq: Every day | ORAL | 0 refills | Status: DC

## 2016-05-05 NOTE — Progress Notes (Signed)
S:  Vaginal itching x 5 days. Now dysuria.  Not sure if UTI or not.  No fever O:  U/A negative for infection  A:  Yeast vaginitis P:  Diflucan 150mg  po.  If no improvement need to see OB/GYN

## 2016-09-20 ENCOUNTER — Telehealth: Payer: Self-pay | Admitting: Family Medicine

## 2016-09-20 ENCOUNTER — Other Ambulatory Visit: Payer: Self-pay | Admitting: Family Medicine

## 2016-09-20 DIAGNOSIS — Z01419 Encounter for gynecological examination (general) (routine) without abnormal findings: Secondary | ICD-10-CM

## 2016-09-20 NOTE — Telephone Encounter (Signed)
Ok to add Vit B12

## 2016-09-20 NOTE — Telephone Encounter (Signed)
I sent a request to order cpx labs today, please include the vit b12. Thanks, T

## 2016-09-20 NOTE — Telephone Encounter (Signed)
Patient is having labs done for her physical tomorrow and she's requesting to have her B-12 checked.

## 2016-09-21 ENCOUNTER — Other Ambulatory Visit (INDEPENDENT_AMBULATORY_CARE_PROVIDER_SITE_OTHER): Payer: 59

## 2016-09-21 DIAGNOSIS — Z01419 Encounter for gynecological examination (general) (routine) without abnormal findings: Secondary | ICD-10-CM

## 2016-09-21 LAB — CBC WITH DIFFERENTIAL/PLATELET
Basophils Absolute: 0 10*3/uL (ref 0.0–0.1)
Basophils Relative: 0.7 % (ref 0.0–3.0)
EOS PCT: 7.5 % — AB (ref 0.0–5.0)
Eosinophils Absolute: 0.5 10*3/uL (ref 0.0–0.7)
HCT: 35.4 % — ABNORMAL LOW (ref 36.0–46.0)
HEMOGLOBIN: 11.8 g/dL — AB (ref 12.0–15.0)
LYMPHS PCT: 26.5 % (ref 12.0–46.0)
Lymphs Abs: 1.7 10*3/uL (ref 0.7–4.0)
MCHC: 33.1 g/dL (ref 30.0–36.0)
MCV: 86.2 fl (ref 78.0–100.0)
MONOS PCT: 5.8 % (ref 3.0–12.0)
Monocytes Absolute: 0.4 10*3/uL (ref 0.1–1.0)
Neutro Abs: 3.8 10*3/uL (ref 1.4–7.7)
Neutrophils Relative %: 59.5 % (ref 43.0–77.0)
Platelets: 260 10*3/uL (ref 150.0–400.0)
RBC: 4.11 Mil/uL (ref 3.87–5.11)
RDW: 14.8 % (ref 11.5–15.5)
WBC: 6.4 10*3/uL (ref 4.0–10.5)

## 2016-09-21 LAB — TSH: TSH: 1.03 u[IU]/mL (ref 0.35–4.50)

## 2016-09-21 LAB — LIPID PANEL
CHOLESTEROL: 170 mg/dL (ref 0–200)
HDL: 85.5 mg/dL (ref >39.00)
LDL CALC: 73 mg/dL (ref 0–99)
NONHDL: 84.62
Total CHOL/HDL Ratio: 2
Triglycerides: 56 mg/dL (ref 0.0–149.0)
VLDL: 11.2 mg/dL (ref 0.0–40.0)

## 2016-09-21 LAB — VITAMIN B12: VITAMIN B 12: 860 pg/mL (ref 211–911)

## 2016-09-21 LAB — COMPREHENSIVE METABOLIC PANEL
ALBUMIN: 4.1 g/dL (ref 3.5–5.2)
ALT: 28 U/L (ref 0–35)
AST: 29 U/L (ref 0–37)
Alkaline Phosphatase: 27 U/L — ABNORMAL LOW (ref 39–117)
BUN: 10 mg/dL (ref 6–23)
CO2: 28 mEq/L (ref 19–32)
CREATININE: 0.62 mg/dL (ref 0.40–1.20)
Calcium: 8.7 mg/dL (ref 8.4–10.5)
Chloride: 101 mEq/L (ref 96–112)
GFR: 117.04 mL/min (ref >60.00)
GLUCOSE: 67 mg/dL — AB (ref 70–99)
POTASSIUM: 4.2 meq/L (ref 3.5–5.1)
SODIUM: 136 meq/L (ref 135–145)
TOTAL PROTEIN: 6.2 g/dL (ref 6.0–8.3)
Total Bilirubin: 0.7 mg/dL (ref 0.2–1.2)

## 2016-09-29 ENCOUNTER — Ambulatory Visit (INDEPENDENT_AMBULATORY_CARE_PROVIDER_SITE_OTHER): Payer: 59 | Admitting: Family Medicine

## 2016-09-29 ENCOUNTER — Encounter: Payer: Self-pay | Admitting: Family Medicine

## 2016-09-29 VITALS — BP 112/82 | HR 87 | Temp 98.6°F | Resp 16 | Ht 64.0 in | Wt 127.0 lb

## 2016-09-29 DIAGNOSIS — Z01419 Encounter for gynecological examination (general) (routine) without abnormal findings: Secondary | ICD-10-CM

## 2016-09-29 DIAGNOSIS — F419 Anxiety disorder, unspecified: Secondary | ICD-10-CM | POA: Insufficient documentation

## 2016-09-29 DIAGNOSIS — D229 Melanocytic nevi, unspecified: Secondary | ICD-10-CM

## 2016-09-29 DIAGNOSIS — F5105 Insomnia due to other mental disorder: Secondary | ICD-10-CM

## 2016-09-29 NOTE — Assessment & Plan Note (Signed)
Refer to derm given color. The patient indicates understanding of these issues and agrees with the plan.

## 2016-09-29 NOTE — Patient Instructions (Signed)
Great to see you.  We are referring you to a dermatologist.  Someone will call you from our office.

## 2016-09-29 NOTE — Progress Notes (Signed)
Subjective:   Patient ID: Marissa Salazar, female    DOB: 1980-12-24, 36 y.o.   MRN: [id number]  Marissa Salazar is a pleasant 36 y.o. year old female who presents to clinic today with Annual Exam (mole on right side of torso)  on 09/29/2016  HPI:  G2P2- has GYN- Dr. Philis Pique. Normal pap smear 02/12/16.  Stress/insomnia- Denies feeling depression.  PHQ9 is 0.  Mole check- mole on her right side that she noticed recently, not sure if has changed but it is dark.   Lab Results  Component Value Date   CHOL 170 09/21/2016   HDL 85.50 09/21/2016   LDLCALC 73 09/21/2016   LDLDIRECT 59 11/28/2008   TRIG 56.0 09/21/2016   CHOLHDL 2 09/21/2016   Lab Results  Component Value Date   NA 136 09/21/2016   K 4.2 09/21/2016   CL 101 09/21/2016   CO2 28 09/21/2016   Lab Results  Component Value Date   WBC 6.4 09/21/2016   HGB 11.8 (L) 09/21/2016   HCT 35.4 (L) 09/21/2016   MCV 86.2 09/21/2016   PLT 260.0 09/21/2016   Lab Results  Component Value Date   TSH 1.03 09/21/2016   Lab Results  Component Value Date   ALT 28 09/21/2016   AST 29 09/21/2016   ALKPHOS 27 (L) 09/21/2016   BILITOT 0.7 09/21/2016   Lab Results  Component Value Date   CREATININE 0.62 09/21/2016    Current Outpatient Prescriptions on File Prior to Visit  Medication Sig Dispense Refill  . Multiple Vitamin (MULTIVITAMIN) tablet Take 1 tablet by mouth daily.     No current facility-administered medications on file prior to visit.     Allergies  Allergen Reactions  . Ivp Dye [Iodinated Diagnostic Agents] Hives    Past Medical History:  Diagnosis Date  . Abnormal Pap smear    in past; normal colpo  . Allergic rhinitis   . Allergy     Past Surgical History:  Procedure Laterality Date  . BREAST BIOPSY Left 06-12-14   fibroadenoma  . BUNIONECTOMY Right 2013  . COLPOSCOPY  2009  . ESOPHAGOGASTRODUODENOSCOPY N/A 07/17/2015   Procedure: ESOPHAGOGASTRODUODENOSCOPY (EGD);  Surgeon: Manya Silvas, MD;   Location: [facility] ENDOSCOPY;  Service: Endoscopy;  Laterality: N/A;    Family History  Problem Relation Age of Onset  . Pancreatic cancer Maternal Aunt   . Breast cancer Maternal Aunt   . Colon cancer Maternal Grandmother 26  . Breast cancer Maternal Grandmother   . Breast cancer Unknown        GP  . Lung cancer Unknown        GP  . Diabetes Paternal Uncle     Social History   Social History  . Marital status: Married    Spouse name: N/A  . Number of children: 1  . Years of education: N/A   Occupational History  . Student    Social History Main Topics  . Smoking status: Never Smoker  . Smokeless tobacco: Never Used  . Alcohol use 0.0 oz/week     Comment: Rare  . Drug use: No  . Sexual activity: Yes    Birth control/ protection: None   Other Topics Concern  . Not on file   Social History Narrative   Married      [name]; 39 y/o son      Ship broker; [employer] program      Regular exercise         The  PMH, PSH, Social History, Family History, Medications, and allergies have been reviewed in [facility], and have been updated if relevant.   Review of Systems  Constitutional: Negative.   HENT: Negative.   Eyes: Negative.   Respiratory: Negative.   Cardiovascular: Negative.   Gastrointestinal: Negative.   Endocrine: Negative.   Genitourinary: Negative.   Musculoskeletal: Negative.   Skin: Positive for color change.  Allergic/Immunologic: Negative.   Neurological: Negative.   Hematological: Negative.   Psychiatric/Behavioral: Positive for sleep disturbance. Negative for dysphoric mood. The patient is nervous/anxious.   All other systems reviewed and are negative.      Objective:    BP 112/82   Pulse 87   Temp 98.6 F (37 C) (Oral)   Resp 16   Ht 5\' 4"  (1.626 m)   Wt 127 lb (57.6 kg)   LMP 09/15/2016   SpO2 98%   BMI 21.80 kg/m    Physical Exam   General:  Well-developed,well-nourished,in no acute distress; alert,appropriate and cooperative throughout  examination Head:  normocephalic and atraumatic.   Eyes:  vision grossly intact, PERRL Ears:  R ear normal and L ear normal externally, TMs clear bilaterally Nose:  no external deformity.   Mouth:  good dentition.   Neck:  No deformities, masses, or tenderness noted. Lungs:  Normal respiratory effort, chest expands symmetrically. Lungs are clear to auscultation, no crackles or wheezes. Heart:  Normal rate and regular rhythm. S1 and S2 normal without gallop, murmur, click, rub or other extra sounds. Abdomen:  Bowel sounds positive,abdomen soft and non-tender without masses, organomegaly or hernias noted. Msk:  No deformity or scoliosis noted of thoracic or lumbar spine.   Extremities:  No clubbing, cyanosis, edema, or deformity noted with normal full range of motion of all joints.   Neurologic:  alert & oriented X3 and gait normal.   Skin:  Intact without suspicious lesions or rashes +dark, pin point nevus on right rib cage Cervical Nodes:  No lymphadenopathy noted Axillary Nodes:  No palpable lymphadenopathy Psych:  Cognition and judgment appear intact. Alert and cooperative with normal attention span and concentration. No apparent delusions, illusions, hallucinations        Assessment & Plan:   Well woman exam  [place] - Plan: Ambulatory referral to Dermatology No Follow-up on file.

## 2016-09-29 NOTE — Assessment & Plan Note (Signed)
Reviewed preventive care protocols, scheduled due services, and updated immunizations Discussed nutrition, exercise, diet, and healthy lifestyle.  

## 2016-10-04 ENCOUNTER — Encounter: Payer: Self-pay | Admitting: Family Medicine

## 2016-10-05 DIAGNOSIS — D485 Neoplasm of uncertain behavior of skin: Secondary | ICD-10-CM | POA: Diagnosis not present

## 2016-10-05 DIAGNOSIS — L812 Freckles: Secondary | ICD-10-CM | POA: Diagnosis not present

## 2016-10-05 DIAGNOSIS — D225 Melanocytic nevi of trunk: Secondary | ICD-10-CM | POA: Diagnosis not present

## 2016-10-05 DIAGNOSIS — Z1283 Encounter for screening for malignant neoplasm of skin: Secondary | ICD-10-CM | POA: Diagnosis not present

## 2016-10-05 DIAGNOSIS — L578 Other skin changes due to chronic exposure to nonionizing radiation: Secondary | ICD-10-CM | POA: Diagnosis not present

## 2016-10-05 DIAGNOSIS — D239 Other benign neoplasm of skin, unspecified: Secondary | ICD-10-CM

## 2016-10-05 DIAGNOSIS — D229 Melanocytic nevi, unspecified: Secondary | ICD-10-CM | POA: Diagnosis not present

## 2016-10-05 HISTORY — DX: Other benign neoplasm of skin, unspecified: D23.9

## 2016-10-18 ENCOUNTER — Encounter: Payer: Self-pay | Admitting: Family Medicine

## 2016-10-19 ENCOUNTER — Other Ambulatory Visit: Payer: Self-pay | Admitting: Family Medicine

## 2016-10-19 MED ORDER — TRAZODONE HCL 50 MG PO TABS: 25.0000 mg | ORAL_TABLET | Freq: Every evening | ORAL | 3 refills | Status: DC | PRN

## 2016-10-27 ENCOUNTER — Ambulatory Visit (INDEPENDENT_AMBULATORY_CARE_PROVIDER_SITE_OTHER): Payer: 59 | Admitting: General Surgery

## 2016-10-27 ENCOUNTER — Encounter: Payer: Self-pay | Admitting: General Surgery

## 2016-10-27 ENCOUNTER — Inpatient Hospital Stay: Payer: Self-pay

## 2016-10-27 VITALS — BP 98/62 | HR 66 | Resp 10 | Ht 64.0 in | Wt 130.0 lb

## 2016-10-27 DIAGNOSIS — N6012 Diffuse cystic mastopathy of left breast: Secondary | ICD-10-CM

## 2016-10-27 DIAGNOSIS — D242 Benign neoplasm of left breast: Secondary | ICD-10-CM

## 2016-10-27 NOTE — Progress Notes (Signed)
Patient ID: Marissa Salazar, female   DOB: 1980/03/10, 36 y.o.   MRN: [id number]  Chief Complaint  Patient presents with  . Follow-up    HPI Marissa Salazar is a 36 y.o. female.  Here for follow up left breast ultrasound. She had a left breast Encor biopsy done on 04/26/16. Occasional left axillary pain described as "achy".   HPI  Past Medical History:  Diagnosis Date  . Abnormal Pap smear    in past; normal colpo  . Allergic rhinitis   . Allergy     Past Surgical History:  Procedure Laterality Date  . BREAST BIOPSY Left 06-12-14   fibroadenoma  . BUNIONECTOMY Right 2013  . COLPOSCOPY  2009  . ESOPHAGOGASTRODUODENOSCOPY N/A 07/17/2015   Procedure: ESOPHAGOGASTRODUODENOSCOPY (EGD);  Surgeon: Manya Silvas, MD;  Location: [facility] ENDOSCOPY;  Service: Endoscopy;  Laterality: N/A;    Family History  Problem Relation Age of Onset  . Pancreatic cancer Maternal Aunt   . Breast cancer Maternal Aunt   . Colon cancer Maternal Grandmother 28  . Breast cancer Maternal Grandmother   . Breast cancer Unknown        GP  . Lung cancer Unknown        GP  . Diabetes Paternal Uncle     Social History Social History  Substance Use Topics  . Smoking status: Never Smoker  . Smokeless tobacco: Never Used  . Alcohol use 0.0 oz/week     Comment: Rare    Allergies  Allergen Reactions  . Ivp Dye [Iodinated Diagnostic Agents] Hives    Current Outpatient Prescriptions  Medication Sig Dispense Refill  . Multiple Vitamin (MULTIVITAMIN) tablet Take 1 tablet by mouth daily.    . traZODone (DESYREL) 50 MG tablet Take 0.5-1 tablets (25-50 mg total) by mouth at bedtime as needed for sleep. 30 tablet 3   No current facility-administered medications for this visit.     Review of Systems Review of Systems  Constitutional: Negative.   Respiratory: Negative.   Cardiovascular: Negative.     Blood pressure 98/62, pulse 66, resp. rate 10, height 5\' 4"  (1.626 m), weight 130 lb (59 kg), last  menstrual period 10/07/2016.  Physical Exam Physical Exam  Constitutional: She is oriented to person, place, and time. She appears well-developed and well-nourished.  Pulmonary/Chest: Left breast exhibits no inverted nipple, no mass, no nipple discharge, no skin change and no tenderness.  Neurological: She is alert and oriented to person, place, and time.  Skin: Skin is warm and dry.  Psychiatric: Her behavior is normal.   Ultrasound the left breast in the outer aspect was performed. Patient had the core biopsy removal of fibroadenoma at the 3:00 location and a second small mass at the 2:00 location. At the 3:00 location 3 cm from the nipple there is an area of what appears to be scarring associated with the clip. At the 2:00 location 7 cm from the nipple is also presence of the clip there is a simple cyst located at 1:00 in the subareolar region in the left axilla no lymphadenopathy is identified. Impression: Likely post biopsy scarring with clips present in both locations at the 3:00 a centimeter from nipple and at 2:00 7 cm from nipple, left breast. Simple cyst noted in the subareolar region at 1:00  Data Reviewed  Prior ultrasound and pathology reports.  Assessment    Fibroadenoma left breast, FCD left breast    Plan    Follow up in one year with  Dr Bary Castilla with office ultrasound.      HPI, Physical Exam, Assessment and Plan have been scribed under the direction and in the presence of Mckinley Jewel, MD Karie Fetch, RN I have completed the exam and reviewed the above documentation for accuracy and completeness.  I agree with the above.  Haematologist has been used and any errors in dictation or transcription are unintentional.  Seeplaputhur G. Jamal Collin, M.D., F.A.C.S.  Junie Panning G 10/31/2016, 8:46 AM

## 2016-10-27 NOTE — Patient Instructions (Signed)
Follow up in one year with Dr Bary Castilla with office ultrasound

## 2016-11-02 ENCOUNTER — Ambulatory Visit: Payer: 59 | Admitting: General Surgery

## 2017-02-07 DIAGNOSIS — I82890 Acute embolism and thrombosis of other specified veins: Secondary | ICD-10-CM

## 2017-02-07 HISTORY — DX: Acute embolism and thrombosis of other specified veins: I82.890

## 2017-03-08 DIAGNOSIS — Z01419 Encounter for gynecological examination (general) (routine) without abnormal findings: Secondary | ICD-10-CM | POA: Diagnosis not present

## 2017-03-08 DIAGNOSIS — Z124 Encounter for screening for malignant neoplasm of cervix: Secondary | ICD-10-CM | POA: Diagnosis not present

## 2017-03-08 LAB — HM PAP SMEAR: HM Pap smear: NEGATIVE

## 2017-03-10 ENCOUNTER — Other Ambulatory Visit (INDEPENDENT_AMBULATORY_CARE_PROVIDER_SITE_OTHER): Payer: 59 | Admitting: Nurse Practitioner

## 2017-03-10 DIAGNOSIS — J020 Streptococcal pharyngitis: Secondary | ICD-10-CM | POA: Diagnosis not present

## 2017-03-10 LAB — POCT RAPID STREP A (OFFICE): Rapid Strep A Screen: POSITIVE — AB

## 2017-03-10 MED ORDER — AMOXICILLIN 875 MG PO TABS: 875.0000 mg | ORAL_TABLET | Freq: Two times a day (BID) | ORAL | 0 refills | Status: DC

## 2017-04-12 DIAGNOSIS — L700 Acne vulgaris: Secondary | ICD-10-CM | POA: Diagnosis not present

## 2017-04-12 DIAGNOSIS — D224 Melanocytic nevi of scalp and neck: Secondary | ICD-10-CM | POA: Diagnosis not present

## 2017-04-12 DIAGNOSIS — D225 Melanocytic nevi of trunk: Secondary | ICD-10-CM | POA: Diagnosis not present

## 2017-04-12 DIAGNOSIS — D485 Neoplasm of uncertain behavior of skin: Secondary | ICD-10-CM | POA: Diagnosis not present

## 2017-05-23 ENCOUNTER — Ambulatory Visit (INDEPENDENT_AMBULATORY_CARE_PROVIDER_SITE_OTHER): Payer: 59 | Admitting: Family Medicine

## 2017-05-23 ENCOUNTER — Encounter: Payer: Self-pay | Admitting: Family Medicine

## 2017-05-23 VITALS — BP 104/62 | HR 65 | Temp 98.4°F | Ht 64.0 in | Wt 127.0 lb

## 2017-05-23 DIAGNOSIS — K219 Gastro-esophageal reflux disease without esophagitis: Secondary | ICD-10-CM | POA: Diagnosis not present

## 2017-05-23 DIAGNOSIS — Z8619 Personal history of other infectious and parasitic diseases: Secondary | ICD-10-CM

## 2017-05-23 IMAGING — DX DG LUMBAR SPINE 2-3V
3 series · 3 of 3 positions shown · non-contrast
Comparison: CT 07/15/2015.

CLINICAL DATA: Back pain.

EXAM:
LUMBAR SPINE - 2-3 VIEW

[l-spine ap]
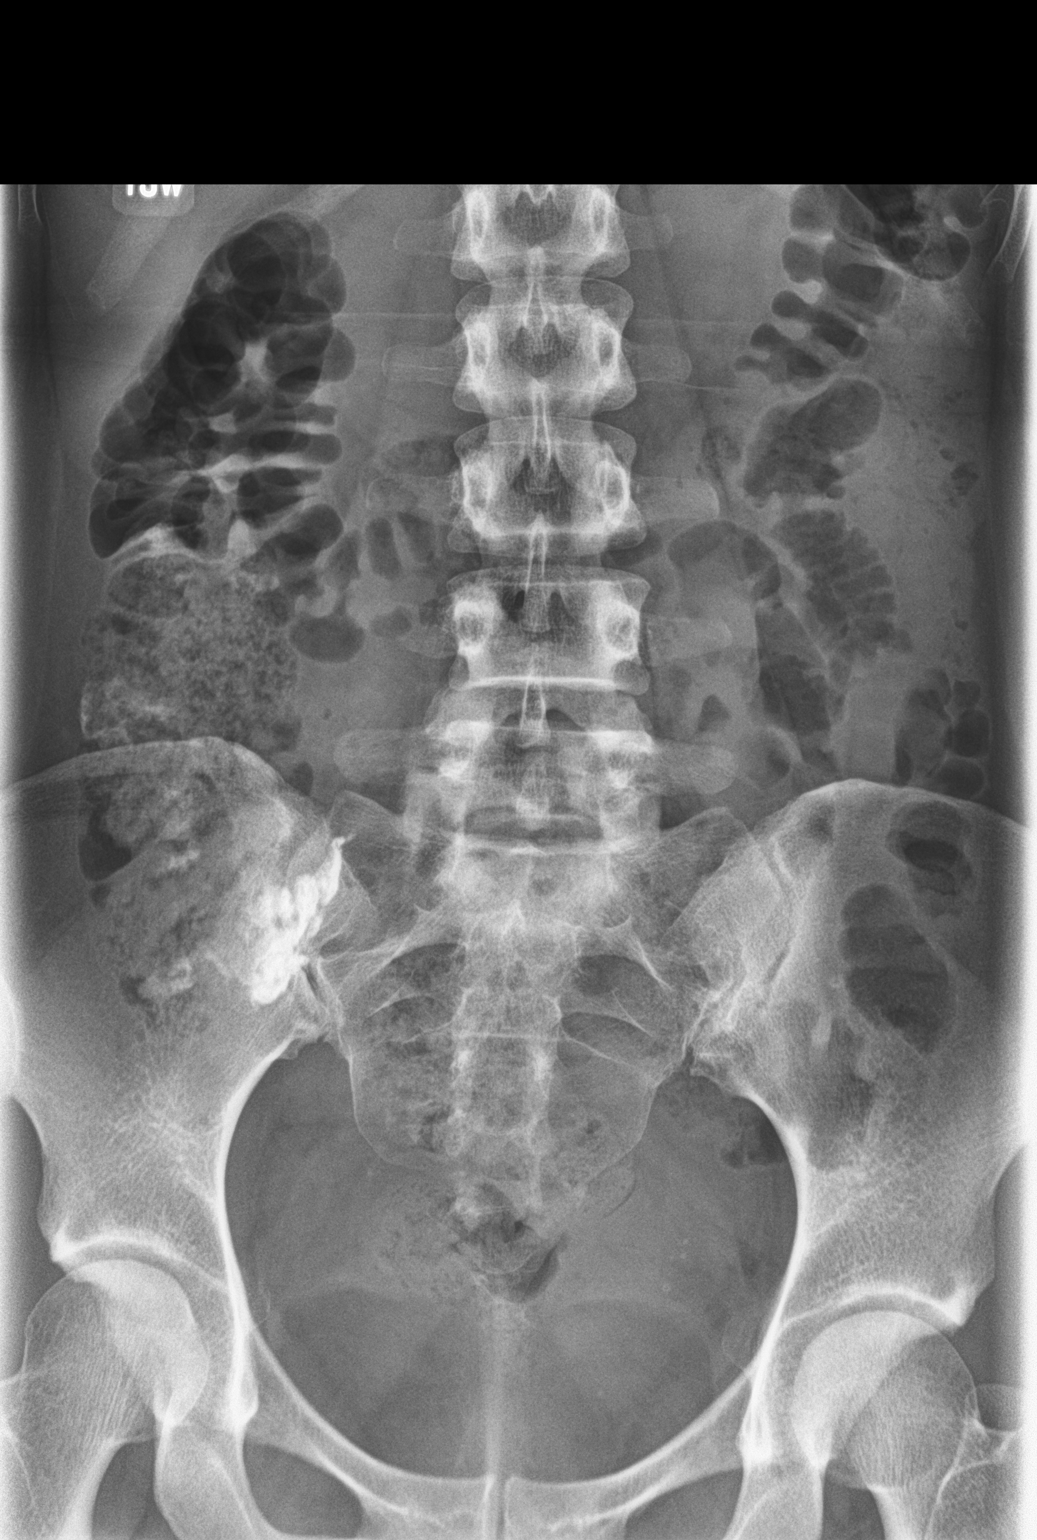

[l-spine lat]
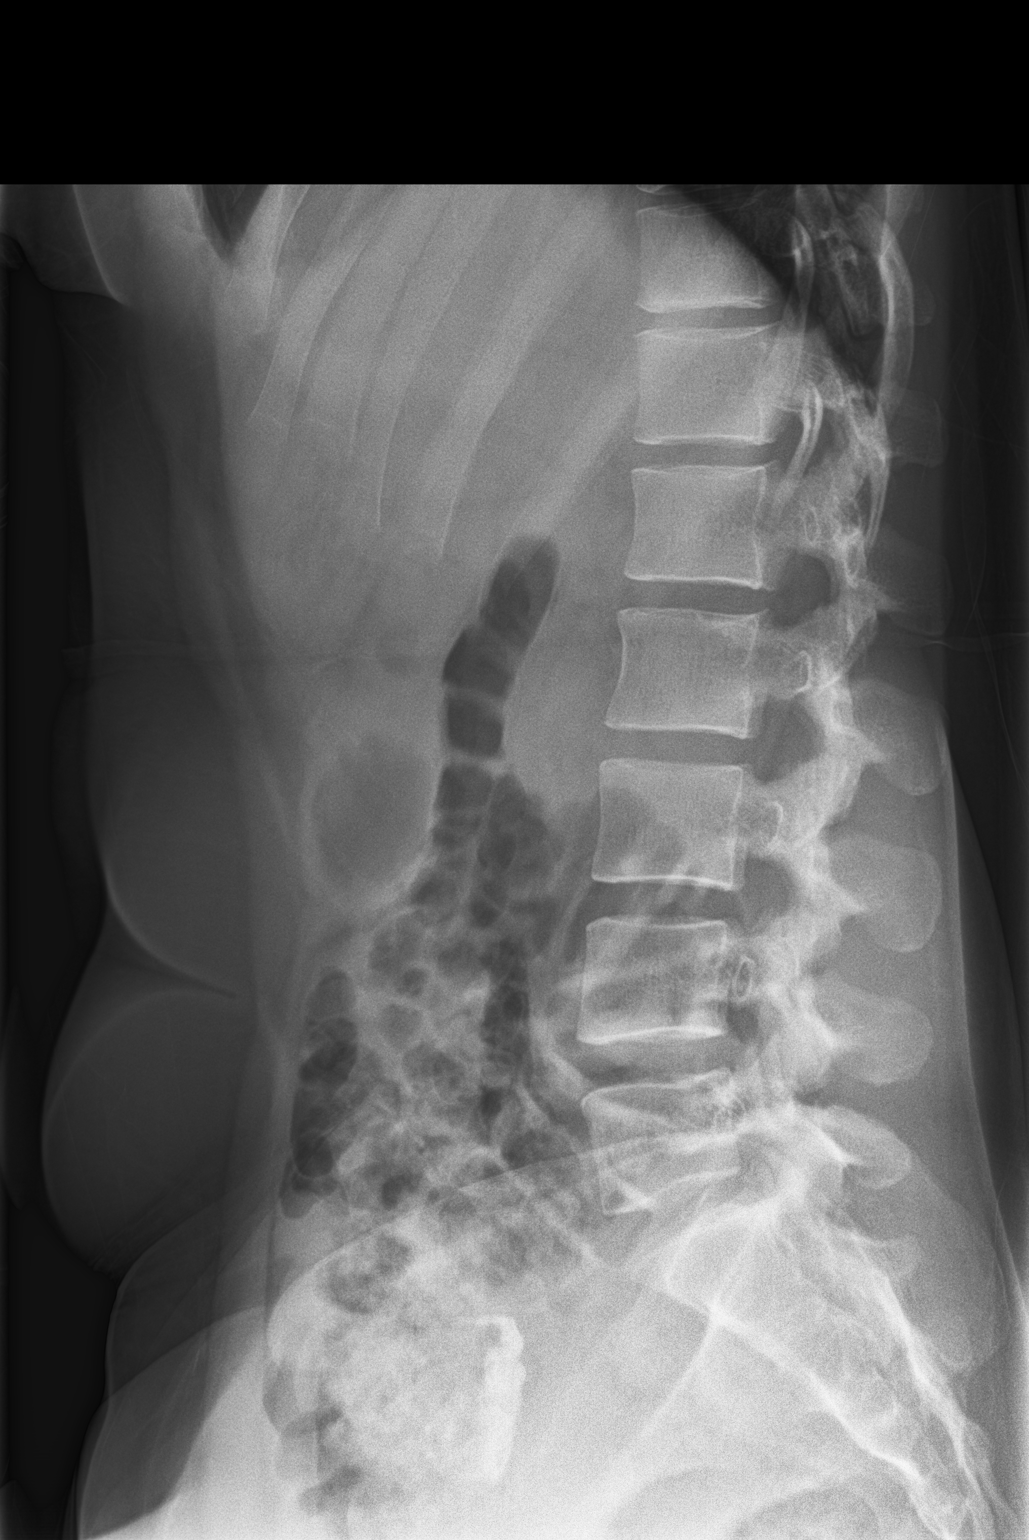

[l-spine l5/s1]
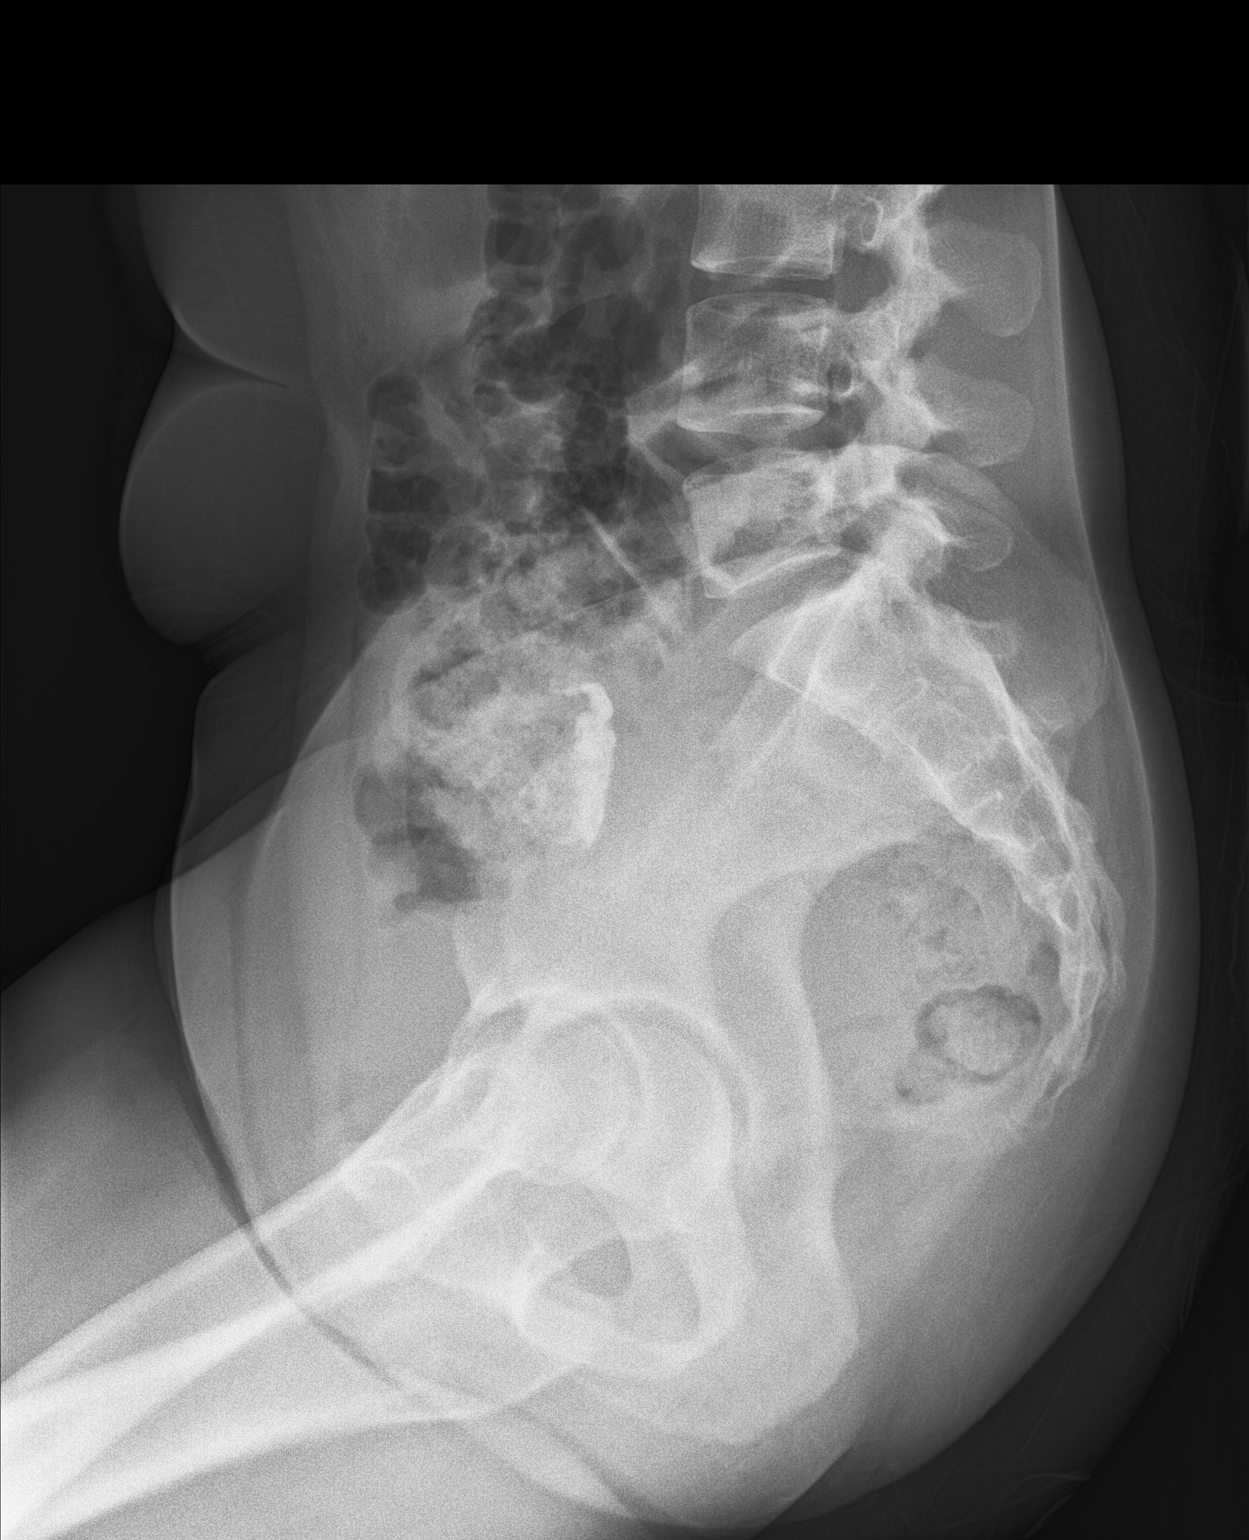

[3 of 3 positions shown; findings below may reference images not displayed]

FINDINGS: No acute bony abnormality identified. Normal alignment
mineralization. Mild scoliosis concave right. Oral contrast in the
bowel from prior recent CT. Pelvic calcifications consistent
phleboliths.
IMPRESSION: Minimal scoliosis concave right. No acute bony abnormality
identified.

## 2017-05-23 MED ORDER — OMEPRAZOLE 20 MG PO CPDR: 20.0000 mg | DELAYED_RELEASE_CAPSULE | Freq: Every day | ORAL | 3 refills | Status: DC

## 2017-05-23 NOTE — Progress Notes (Signed)
Subjective:   Patient ID: Marissa Salazar, female    DOB: [birth year], 37 y.o.   MRN: [id number]  Marissa Salazar is a pleasant 37 y.o. year old female who presents to clinic today with Gastroesophageal Reflux (Patient is here today C/O acid reflux x1wk.   She is also experiencing bloating.  She has a H/O H. Pylori which she states she feels similar to when she had that.  She has Tx with Gaviscon & Tums OTC but Sx persist.)  on 05/23/2017  HPI:  GERD-  Past week, worsening symptoms of reflux. H/o H Pylori.  Feels this is similar.  Experiencing bloating.  She is under more stress, not eating as well.  Has only tried OTC tums and gas x for this.  Having bloating, epigastric pain, acid coming up in her throat.  No black stool. No nausea or vomiting. Current Outpatient Medications on File Prior to Visit  Medication Sig Dispense Refill  . Adapalene 0.3 % gel APPLY EVERY NIGHT AT BEDTIME TO BACK AND shoulders FOR acne  3  . BENZOYL PEROXIDE 10 % external wash     . Multiple Vitamin (MULTIVITAMIN) tablet Take 1 tablet by mouth daily.     No current facility-administered medications on file prior to visit.     Allergies  Allergen Reactions  . Ivp Dye [Iodinated Diagnostic Agents] Hives    Past Medical History:  Diagnosis Date  . Abnormal Pap smear    in past; normal colpo  . Allergic rhinitis   . Allergy     Past Surgical History:  Procedure Laterality Date  . BREAST BIOPSY Left 06-12-14   fibroadenoma  . BUNIONECTOMY Right 2013  . COLPOSCOPY  2009  . ESOPHAGOGASTRODUODENOSCOPY N/A 07/17/2015   Procedure: ESOPHAGOGASTRODUODENOSCOPY (EGD);  Surgeon: Manya Silvas, MD;  Location: [facility] ENDOSCOPY;  Service: Endoscopy;  Laterality: N/A;    Family History  Problem Relation Age of Onset  . Pancreatic cancer Maternal Aunt   . Breast cancer Maternal Aunt   . Colon cancer Maternal Grandmother 14  . Breast cancer Maternal Grandmother   . Breast cancer Unknown        GP  . Lung  cancer Unknown        GP  . Diabetes Paternal Uncle     Social History   Socioeconomic History  . Marital status: Married    Spouse name: Not on file  . Number of children: 1  . Years of education: Not on file  . Highest education level: Not on file  Occupational History  . Occupation: Ship broker  Social Needs  . Financial resource strain: Not on file  . Food insecurity:    Worry: Not on file    Inability: Not on file  . Transportation needs:    Medical: Not on file    Non-medical: Not on file  Tobacco Use  . Smoking status: Never Smoker  . Smokeless tobacco: Never Used  Substance and Sexual Activity  . Alcohol use: Yes    Alcohol/week: 0.0 oz    Comment: Rare  . Drug use: No  . Sexual activity: Yes    Birth control/protection: None  Lifestyle  . Physical activity:    Days per week: Not on file    Minutes per session: Not on file  . Stress: Not on file  Relationships  . Social connections:    Talks on phone: Not on file    Gets together: Not on file    Attends religious  service: Not on file    Active member of club or organization: Not on file    Attends meetings of clubs or organizations: Not on file    Relationship status: Not on file  . Intimate partner violence:    Fear of current or ex partner: Not on file    Emotionally abused: Not on file    Physically abused: Not on file    Forced sexual activity: Not on file  Other Topics Concern  . Not on file  Social History Narrative   Married      [name]; 5 y/o son      Ship broker; CMA program      Regular exercise         The PMH, PSH, Social History, Family History, Medications, and allergies have been reviewed in [facility], and have been updated if relevant.   Review of Systems  Constitutional: Negative.   Gastrointestinal: Positive for abdominal distention. Negative for abdominal pain, anal bleeding, blood in stool, constipation, diarrhea, nausea, rectal pain and vomiting.  All other systems reviewed and are  negative.      Objective:    BP 104/62 (BP Location: Left Arm, Patient Position: Sitting, Cuff Size: Normal)   Pulse 65   Temp 98.4 F (36.9 C) (Oral)   Ht 5\' 4"  (1.626 m)   Wt 127 lb (57.6 kg)   LMP 05/09/2017   SpO2 100%   BMI 21.80 kg/m    Physical Exam  Constitutional: She is oriented to person, place, and time. She appears well-developed and well-nourished. No distress.  HENT:  Head: Normocephalic and atraumatic.  Eyes: EOM are normal.  Neck: Normal range of motion.  Cardiovascular: Normal rate.  Pulmonary/Chest: Effort normal.  Neurological: She is alert and oriented to person, place, and time.  Skin: Skin is warm and dry. She is not diaphoretic.  Psychiatric: She has a normal mood and affect. Her behavior is normal. Thought content normal.  Nursing note and vitals reviewed.         Assessment & Plan:   Gastroesophageal reflux disease, esophagitis presence not specified - Plan: Helicobacter pylori special antigen  History of Helicobacter pylori infection - Plan: Helicobacter pylori special antigen No follow-ups on file.

## 2017-05-23 NOTE — Assessment & Plan Note (Signed)
Deteriorated. Given h/o previous H pylor infection, will check stool antigen test. Start on omeprazole 20 mg daily. Call or return to clinic prn if these symptoms worsen or fail to improve as anticipated. The patient indicates understanding of these issues and agrees with the plan.

## 2017-05-24 DIAGNOSIS — K219 Gastro-esophageal reflux disease without esophagitis: Secondary | ICD-10-CM | POA: Diagnosis not present

## 2017-05-24 DIAGNOSIS — Z8619 Personal history of other infectious and parasitic diseases: Secondary | ICD-10-CM | POA: Diagnosis not present

## 2017-05-25 LAB — HELICOBACTER PYLORI  SPECIAL ANTIGEN
MICRO NUMBER:: 90473039
SPECIMEN QUALITY: ADEQUATE

## 2017-05-26 ENCOUNTER — Encounter: Payer: Self-pay | Admitting: Family Medicine

## 2017-09-04 ENCOUNTER — Other Ambulatory Visit: Payer: Self-pay | Admitting: Family Medicine

## 2017-09-04 DIAGNOSIS — D509 Iron deficiency anemia, unspecified: Secondary | ICD-10-CM

## 2017-09-05 ENCOUNTER — Other Ambulatory Visit (INDEPENDENT_AMBULATORY_CARE_PROVIDER_SITE_OTHER): Payer: 59

## 2017-09-05 DIAGNOSIS — D509 Iron deficiency anemia, unspecified: Secondary | ICD-10-CM | POA: Diagnosis not present

## 2017-09-05 LAB — IRON,TIBC AND FERRITIN PANEL
%SAT: 6
%SAT: 6 % (calc) — ABNORMAL LOW (ref 16–45)
Ferritin: 11
Ferritin: 11 ng/mL — ABNORMAL LOW (ref 16–154)
IRON: 22 ug/dL — AB (ref 40–190)
Iron: 22
TIBC: 373
TIBC: 373 ug/dL (ref 250–450)

## 2017-09-18 DIAGNOSIS — D500 Iron deficiency anemia secondary to blood loss (chronic): Secondary | ICD-10-CM | POA: Diagnosis not present

## 2017-09-18 DIAGNOSIS — D649 Anemia, unspecified: Secondary | ICD-10-CM | POA: Diagnosis not present

## 2017-10-04 ENCOUNTER — Encounter (HOSPITAL_COMMUNITY): Payer: Self-pay

## 2017-10-11 ENCOUNTER — Encounter (HOSPITAL_COMMUNITY): Payer: Self-pay

## 2017-10-11 DIAGNOSIS — N920 Excessive and frequent menstruation with regular cycle: Secondary | ICD-10-CM | POA: Diagnosis not present

## 2017-10-12 ENCOUNTER — Encounter (HOSPITAL_COMMUNITY): Payer: Self-pay

## 2017-10-12 DIAGNOSIS — D649 Anemia, unspecified: Secondary | ICD-10-CM | POA: Diagnosis not present

## 2017-10-12 LAB — CBC AND DIFFERENTIAL
HCT: 38 (ref 36–46)
Hemoglobin: 12.4 (ref 12.0–16.0)
Platelets: 257 (ref 150–399)
WBC: 7.3

## 2017-10-12 LAB — LIPID PANEL
Cholesterol: 188 (ref 0–200)
HDL: 114 — AB (ref 35–70)
LDL Cholesterol: 66
Triglycerides: 42 (ref 40–160)

## 2017-10-12 LAB — HEMOGLOBIN A1C: Hemoglobin A1C: 5

## 2017-10-12 LAB — BASIC METABOLIC PANEL
BUN: 15 (ref 4–21)
Creatinine: 0.8 (ref 0.5–1.1)
Glucose: 84
Potassium: 4.8 (ref 3.4–5.3)
Sodium: 142 (ref 137–147)

## 2017-10-12 LAB — HEPATIC FUNCTION PANEL
ALT: 106 — AB (ref 7–35)
AST: 44 — AB (ref 13–35)
Alkaline Phosphatase: 47 (ref 25–125)

## 2017-10-12 LAB — TSH: TSH: 1.23 (ref 0.41–5.90)

## 2017-11-14 ENCOUNTER — Ambulatory Visit: Payer: 59 | Admitting: General Surgery

## 2017-12-01 DIAGNOSIS — N920 Excessive and frequent menstruation with regular cycle: Secondary | ICD-10-CM | POA: Diagnosis not present

## 2017-12-06 ENCOUNTER — Other Ambulatory Visit: Payer: Self-pay | Admitting: Family Medicine

## 2017-12-06 DIAGNOSIS — R7989 Other specified abnormal findings of blood chemistry: Secondary | ICD-10-CM

## 2017-12-06 DIAGNOSIS — R945 Abnormal results of liver function studies: Principal | ICD-10-CM

## 2017-12-12 ENCOUNTER — Ambulatory Visit: Payer: 59 | Admitting: General Surgery

## 2017-12-25 ENCOUNTER — Other Ambulatory Visit (INDEPENDENT_AMBULATORY_CARE_PROVIDER_SITE_OTHER): Payer: 59

## 2017-12-25 DIAGNOSIS — R7989 Other specified abnormal findings of blood chemistry: Secondary | ICD-10-CM

## 2017-12-25 DIAGNOSIS — R945 Abnormal results of liver function studies: Secondary | ICD-10-CM

## 2017-12-25 LAB — COMPREHENSIVE METABOLIC PANEL
ALBUMIN: 3.9 g/dL (ref 3.5–5.2)
ALT: 20 U/L (ref 0–35)
AST: 22 U/L (ref 0–37)
Alkaline Phosphatase: 26 U/L — ABNORMAL LOW (ref 39–117)
BUN: 13 mg/dL (ref 6–23)
CHLORIDE: 105 meq/L (ref 96–112)
CO2: 25 mEq/L (ref 19–32)
CREATININE: 0.84 mg/dL (ref 0.40–1.20)
Calcium: 8.7 mg/dL (ref 8.4–10.5)
GFR: 81.83 mL/min (ref >60.00)
Glucose, Bld: 88 mg/dL (ref 70–99)
Potassium: 3.9 mEq/L (ref 3.5–5.1)
SODIUM: 138 meq/L (ref 135–145)
Total Bilirubin: 0.2 mg/dL (ref 0.2–1.2)
Total Protein: 6.9 g/dL (ref 6.0–8.3)

## 2018-01-02 ENCOUNTER — Other Ambulatory Visit: Payer: Self-pay

## 2018-01-02 ENCOUNTER — Ambulatory Visit (INDEPENDENT_AMBULATORY_CARE_PROVIDER_SITE_OTHER): Payer: 59 | Admitting: General Surgery

## 2018-01-02 ENCOUNTER — Encounter: Payer: Self-pay | Admitting: General Surgery

## 2018-01-02 ENCOUNTER — Ambulatory Visit: Payer: Self-pay

## 2018-01-02 VITALS — BP 134/87 | HR 78 | Temp 97.7°F | Resp 13 | Ht 66.0 in | Wt 129.0 lb

## 2018-01-02 DIAGNOSIS — N6012 Diffuse cystic mastopathy of left breast: Secondary | ICD-10-CM

## 2018-01-02 NOTE — Patient Instructions (Signed)
The patient is aware to call back for any questions or new concerns.  

## 2018-01-02 NOTE — Progress Notes (Signed)
Patient ID: Marissa Salazar, female   DOB: 1980/08/21, 37 y.o.   MRN: [id number]  Chief Complaint  Patient presents with  . Follow-up    HPI Marissa Salazar is a 37 y.o. female here today for her left breast ultrasound, former patient of Dr Marissa Salazar.No new breast issues. The patient had previously undergone vacuum removal of a fibroadenoma from the left breast in March 2018 due to local discomfort.  She is not aware of any recurrence. The patient reports no nipple discharge.  No significant breast discomfort.  She questioned whether she should have her breast removed, and I am not quite clear what prompted this question considering her fairly unremarkable history.  (Her aunt was negative for a carrier line abnormality, and it is her closest relative with breast cancer)  The patient reports she is been experiencing heavy, painful periods and is been diagnosed with a 4 cm fibroid.  Attempts at hormonal suppression were complicated with a transient episode of superficial venous thrombosis.  She is presently investigating other opportunities for management with her GYN in [place]. HPI  Past Medical History:  Diagnosis Date  . Abnormal Pap smear    in past; normal colpo  . Allergic rhinitis   . Allergy   . Superficial vein thrombosis 2019   Left leg    Past Surgical History:  Procedure Laterality Date  . BREAST BIOPSY Left 06-12-14   fibroadenoma  . BUNIONECTOMY Right 2013  . COLPOSCOPY  2009  . ESOPHAGOGASTRODUODENOSCOPY N/A 07/17/2015   Procedure: ESOPHAGOGASTRODUODENOSCOPY (EGD);  Surgeon: Marissa Silvas, MD;  Location: [facility] ENDOSCOPY;  Service: Endoscopy;  Laterality: N/A;    Family History  Problem Relation Age of Onset  . Pancreatic cancer Maternal Aunt   . Breast cancer Maternal Aunt        BRCA negative  . Colon cancer Maternal Grandmother 35  . Breast cancer Maternal Grandmother   . Breast cancer Unknown        Marissa Salazar  . Lung cancer Unknown        Marissa Salazar  . Diabetes Paternal  Uncle     Social History Social History   Tobacco Use  . Smoking status: Never Smoker  . Smokeless tobacco: Never Used  Substance Use Topics  . Alcohol use: Yes    Alcohol/week: 0.0 standard drinks    Comment: Rare  . Drug use: No    Allergies  Allergen Reactions  . Ivp Dye [Iodinated Diagnostic Agents] Hives    Current Outpatient Medications  Medication Sig Dispense Refill  . Multiple Vitamin (MULTIVITAMIN) tablet Take 1 tablet by mouth daily.    Marland Kitchen omeprazole (PRILOSEC) 20 MG capsule Take 1 capsule (20 mg total) by mouth daily. 30 capsule 3   No current facility-administered medications for this visit.     Review of Systems Review of Systems  Constitutional: Negative.   Respiratory: Negative.   Cardiovascular: Negative.     Blood pressure 134/87, pulse 78, temperature 97.7 F (36.5 C), temperature source Skin, resp. rate 13, height _0  (1.676 m), weight 129 lb (58.5 kg), SpO2 98 %.  Physical Exam Physical Exam  Constitutional: She is oriented to person, place, and time. She appears well-developed and well-nourished.  HENT:  Mouth/Throat: Oropharynx is clear and moist.  Eyes: Conjunctivae are normal. No scleral icterus.  Neck: Neck supple.  Cardiovascular: Normal rate, regular rhythm and normal heart sounds.  Pulmonary/Chest: Effort normal and breath sounds normal. Right breast exhibits no inverted nipple, no mass, no nipple  discharge, no skin change and no tenderness. Left breast exhibits no inverted nipple, no mass, no nipple discharge, no skin change and no tenderness.  Very modest breast volume bilaterally without dominant mass or thickening.  Lymphadenopathy:    She has no cervical adenopathy.    She has no axillary adenopathy.  Neurological: She is alert and oriented to person, place, and time.  Skin: Skin is warm and dry.  Psychiatric: Her behavior is normal.    Data Reviewed Ultrasound of the left breast was undertaken.  Multiple cystic lesions are  appreciated.  Beginning at the 12 o'clock position 1 cm from the nipple a 0.53 x 0.68 x 0.68 softly lobulated simple cyst is noted.  At the 9 o'clock position a 0.4 x 0.6 x 0.67 cm hypoechoic lesion likely representing a small fibroadenoma is identified.  This is smooth borders and faint posterior acoustic enhancement.  At the 4 o'clock position, 4 cm from the nipple a simple cyst is noted measuring less than 4 mm in diameter.  At the 2 o'clock position, 7 cm from the nipple a simple cyst is noted measuring less than 4 mm in diameter.  BI-RADS-2.  Assessment    Fibrocystic disease, small fibroadenoma warranting observation.    Plan  Considering her very modest breast volume I do not think mammograms have anything to offer at this time.  Considering to previous benign biopsies I think continued observation by physical exam and ultrasound is reasonable.  Mastectomy as a prophylactic measure was discouraged.  Follow up in one year with office ultrasound.   HPI, Physical Exam, Assessment and Plan have been scribed under the direction and in the presence of Marissa Bellow, MD. Marissa Fetch, RN   HPI, Physical Exam, Assessment and Plan have been scribed under the direction and in the presence of Marissa Ard, MD.  Marissa Salazar, CMA  I have completed the exam and reviewed the above documentation for accuracy and completeness.  I agree with the above.  Haematologist has been used and any errors in dictation or transcription are unintentional.  Marissa Salazar, M.D., F.A.C.S.  Marissa Salazar  01/03/2018, 11:02 AM

## 2018-01-12 DIAGNOSIS — N920 Excessive and frequent menstruation with regular cycle: Secondary | ICD-10-CM | POA: Diagnosis not present

## 2018-01-12 DIAGNOSIS — N9419 Other specified dyspareunia: Secondary | ICD-10-CM | POA: Diagnosis not present

## 2018-02-27 ENCOUNTER — Other Ambulatory Visit: Payer: Self-pay | Admitting: Family Medicine

## 2018-02-27 MED ORDER — FERROUS SULFATE 325 (65 FE) MG PO TBEC: 325.0000 mg | DELAYED_RELEASE_TABLET | Freq: Every day | ORAL | 0 refills | Status: DC

## 2018-03-06 ENCOUNTER — Telehealth: Payer: No Typology Code available for payment source | Admitting: Physician Assistant

## 2018-03-06 DIAGNOSIS — N390 Urinary tract infection, site not specified: Secondary | ICD-10-CM

## 2018-03-06 MED ORDER — CEPHALEXIN 500 MG PO CAPS: 500.0000 mg | ORAL_CAPSULE | Freq: Two times a day (BID) | ORAL | 0 refills | Status: AC

## 2018-03-06 NOTE — Progress Notes (Signed)

## 2018-03-29 ENCOUNTER — Encounter: Payer: Self-pay | Admitting: Family Medicine

## 2018-03-29 ENCOUNTER — Ambulatory Visit (INDEPENDENT_AMBULATORY_CARE_PROVIDER_SITE_OTHER): Payer: No Typology Code available for payment source | Admitting: Family Medicine

## 2018-03-29 VITALS — BP 102/69 | HR 60 | Ht 64.25 in | Wt 125.2 lb

## 2018-03-29 DIAGNOSIS — Z0000 Encounter for general adult medical examination without abnormal findings: Secondary | ICD-10-CM

## 2018-03-29 DIAGNOSIS — Z8619 Personal history of other infectious and parasitic diseases: Secondary | ICD-10-CM

## 2018-03-29 DIAGNOSIS — Z86018 Personal history of other benign neoplasm: Secondary | ICD-10-CM | POA: Diagnosis not present

## 2018-03-29 DIAGNOSIS — R000 Tachycardia, unspecified: Secondary | ICD-10-CM

## 2018-03-29 DIAGNOSIS — Z7689 Persons encountering health services in other specified circumstances: Secondary | ICD-10-CM

## 2018-03-29 DIAGNOSIS — D509 Iron deficiency anemia, unspecified: Secondary | ICD-10-CM | POA: Insufficient documentation

## 2018-03-29 DIAGNOSIS — N924 Excessive bleeding in the premenopausal period: Secondary | ICD-10-CM | POA: Diagnosis not present

## 2018-03-29 DIAGNOSIS — N922 Excessive menstruation at puberty: Secondary | ICD-10-CM | POA: Insufficient documentation

## 2018-03-29 DIAGNOSIS — D649 Anemia, unspecified: Secondary | ICD-10-CM

## 2018-03-29 DIAGNOSIS — R945 Abnormal results of liver function studies: Secondary | ICD-10-CM

## 2018-03-29 DIAGNOSIS — N920 Excessive and frequent menstruation with regular cycle: Secondary | ICD-10-CM

## 2018-03-29 DIAGNOSIS — R7989 Other specified abnormal findings of blood chemistry: Secondary | ICD-10-CM

## 2018-03-29 HISTORY — DX: Excessive and frequent menstruation with regular cycle: N92.0

## 2018-03-29 NOTE — Patient Instructions (Signed)
-Please call Dr. Malachi Carl office regarding your heavy bleeding so that you can set up appointment for evaluation of your uterine fibroid as well as plan definitive treatment options.   This is critical towards you correcting your anemia and improving your overall sense of wellbeing, energy levels etc.  You can continue to exercise as this has no impact on your anemia as long as you have the energy to do so.  We will let you know how your labs look once we get them back over the next several days.  Please be anticipating a MyChart message.     Please realize, EXERCISE IS MEDICINE!  -  American Heart Association [facility]) guidelines for exercise : If you are in good health, without any medical conditions, you should engage in 150-300 minutes of moderate intensity aerobic activity per week.  This means you should be huffing and puffing throughout your workout.   Engaging in regular exercise will improve brain function and memory, as well as improve mood, boost immune system and help with weight management.  As well as the other, more well-known effects of exercise such as decreasing blood sugar levels, decreasing blood pressure,  and decreasing bad cholesterol levels/ increasing good cholesterol levels.     -  The AHA strongly endorses consumption of a diet that contains a variety of foods from all the food categories with an emphasis on fruits and vegetables; fat-free and low-fat dairy products; cereal and grain products; legumes and nuts; and fish, poultry, and/or extra lean meats.    Excessive food intake, especially of foods high in saturated and trans fats, sugar, and salt, should be avoided.    Adequate water intake of roughly 1/2 of your weight in pounds, should equal the ounces of water per day you should drink.  So for instance, if you're 200 pounds, that would be 100 ounces of water per day.         Mediterranean Diet  Why follow it? Research shows. . Those who follow the Mediterranean  diet have a reduced risk of heart disease  . The diet is associated with a reduced incidence of Parkinson's and Alzheimer's diseases . People following the diet may have longer life expectancies and lower rates of chronic diseases  . The Dietary Guidelines for Americans recommends the Mediterranean diet as an eating plan to promote health and prevent disease  What Is the Mediterranean Diet?  . Healthy eating plan based on typical foods and recipes of Mediterranean-style cooking . The diet is primarily a plant based diet; these foods should make up a majority of meals   Starches - Plant based foods should make up a majority of meals - They are an important sources of vitamins, minerals, energy, antioxidants, and fiber - Choose whole grains, foods high in fiber and minimally processed items  - Typical grain sources include wheat, oats, barley, corn, brown rice, bulgar, farro, millet, polenta, couscous  - Various types of beans include chickpeas, lentils, fava beans, black beans, white beans   Fruits  Veggies - Large quantities of antioxidant rich fruits & veggies; 6 or more servings  - Vegetables can be eaten raw or lightly drizzled with oil and cooked  - Vegetables common to the traditional Mediterranean Diet include: artichokes, arugula, beets, broccoli, brussel sprouts, cabbage, carrots, celery, collard greens, cucumbers, eggplant, kale, leeks, lemons, lettuce, mushrooms, okra, onions, peas, peppers, potatoes, pumpkin, radishes, rutabaga, shallots, spinach, sweet potatoes, turnips, zucchini - Fruits common to the Mediterranean Diet include: apples,  apricots, avocados, cherries, clementines, dates, figs, grapefruits, grapes, melons, nectarines, oranges, peaches, pears, pomegranates, strawberries, tangerines  Fats - Replace butter and margarine with healthy oils, such as olive oil, canola oil, and tahini  - Limit nuts to no more than a handful a day  - Nuts include walnuts, almonds, pecans,  pistachios, pine nuts  - Limit or avoid candied, honey roasted or heavily salted nuts - Olives are central to the Mediterranean diet - can be eaten whole or used in a variety of dishes   Meats Protein - Limiting red meat: no more than a few times a month - When eating red meat: choose lean cuts and keep the portion to the size of deck of cards - Eggs: approx. 0 to 4 times a week  - Fish and lean poultry: at least 2 a week  - Healthy protein sources include, chicken, [place], lean beef, lamb - Increase intake of seafood such as tuna, salmon, trout, mackerel, shrimp, scallops - Avoid or limit high fat processed meats such as sausage and bacon  Dairy - Include moderate amounts of low fat dairy products  - Focus on healthy dairy such as fat free yogurt, skim milk, low or reduced fat cheese - Limit dairy products higher in fat such as whole or 2% milk, cheese, ice cream  Alcohol - Moderate amounts of red wine is ok  - No more than 5 oz daily for women (all ages) and men older than age 53  - No more than 10 oz of wine daily for men younger than 72  Other - Limit sweets and other desserts  - Use herbs and spices instead of salt to flavor foods  - Herbs and spices common to the traditional Mediterranean Diet include: basil, bay leaves, chives, cloves, cumin, fennel, garlic, lavender, marjoram, mint, oregano, parsley, pepper, rosemary, sage, savory, sumac, tarragon, thyme   It's not just a diet, it's a lifestyle:  . The Mediterranean diet includes lifestyle factors typical of those in the region  . Foods, drinks and meals are best eaten with others and savored . Daily physical activity is important for overall good health . This could be strenuous exercise like running and aerobics . This could also be more leisurely activities such as walking, housework, yard-work, or taking the stairs . Moderation is the key; a balanced and healthy diet accommodates most foods and drinks . Consider portion sizes  and frequency of consumption of certain foods   Meal Ideas & Options:  . Breakfast:  o Whole wheat toast or whole wheat English muffins with peanut butter & hard boiled egg o Steel cut oats topped with apples & cinnamon and skim milk  o Fresh fruit: banana, strawberries, melon, berries, peaches  o Smoothies: strawberries, bananas, greek yogurt, peanut butter o Low fat greek yogurt with blueberries and granola  o Egg white omelet with spinach and mushrooms o Breakfast couscous: whole wheat couscous, apricots, skim milk, cranberries  . Sandwiches:  o Hummus and grilled vegetables (peppers, zucchini, squash) on whole wheat bread   o Grilled chicken on whole wheat pita with lettuce, tomatoes, cucumbers or tzatziki  o Tuna salad on whole wheat bread: tuna salad made with greek yogurt, olives, red peppers, capers, green onions o Garlic rosemary lamb pita: lamb sauted with garlic, rosemary, salt & pepper; add lettuce, cucumber, greek yogurt to pita - flavor with lemon juice and black pepper  . Seafood:  o Mediterranean grilled salmon, seasoned with garlic, basil, parsley, lemon juice  and black pepper o Shrimp, lemon, and spinach whole-grain pasta salad made with low fat greek yogurt  o Seared scallops with lemon orzo  o Seared tuna steaks seasoned salt, pepper, coriander topped with tomato mixture of olives, tomatoes, olive oil, minced garlic, parsley, green onions and cappers  . Meats:  o Herbed greek chicken salad with kalamata olives, cucumber, feta  o Red bell peppers stuffed with spinach, bulgur, lean ground beef (or lentils) & topped with feta   o Kebabs: skewers of chicken, tomatoes, onions, zucchini, squash  o [place] burgers: made with red onions, mint, dill, lemon juice, feta cheese topped with roasted red peppers . Vegetarian o Cucumber salad: cucumbers, artichoke hearts, celery, red onion, feta cheese, tossed in olive oil & lemon juice  o Hummus and whole grain pita points with a  greek salad (lettuce, tomato, feta, olives, cucumbers, red onion) o Lentil soup with celery, carrots made with vegetable broth, garlic, salt and pepper  o Tabouli salad: parsley, bulgur, mint, scallions, cucumbers, tomato, radishes, lemon juice, olive oil, salt and pepper.

## 2018-03-29 NOTE — Progress Notes (Signed)
New patient office visit note:  Impression and Recommendations:    1. Encounter to establish care with new doctor   2. History of uterine fibroid   3. Excessive bleeding in premenopausal period   4. h/o episodic Elevated pulse rate   5. History of Helicobacter pylori infection   6. h/o Elevated LFTs   7. Anemia, unspecified type   8. Health care maintenance     Superficial venous thrombosis s/p OCP -Patient discontinued OCP due to this, however, this resolved with compression and heat. 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  -Patient currently on 325 mg ferrous sulfate every other day when she remembers -Labs to be obtained today (CBC and full anemia panel) -Told patient to take 325 mg Ferrous sulfate BID daily along with vitamin C   Uterine Fibroid with menorrhagia -She has a 4 cm uterine fibroid and was recommended by her GYN specialist, several procedures to help with this, D&C and ablation etc., however, the patient hasn't made a decision on this.  - we discussed this today, pt fears/ concerns etc  -Patient has menorrhagia due to this and causing anemia.   - I highly rec pt call GYN for a f/up appt to get this taken care of since it is causing a secondary condition that can be significantly affecting her quality of life   Labs (Full set plus B12, anemia panel, iron studies) to be obtained today. Follow up in 6 months or sooner if concerns.   Education and routine counseling performed. Handouts provided.   Orders Placed This Encounter  Procedures  . CBC with Differential/Platelet  . Comprehensive metabolic panel    . Hemoglobin A1c  . Lipid panel  . T4, free  . TSH  . VITAMIN D 25 Hydroxy (Vit-D Deficiency, Fractures)  . B12  . T3, free  . Iron, TIBC and Ferritin Panel  . Transferrin  . Folate  . Retic    Expresses verbal understanding and consents to current therapy plan and treatment regimen.  Return for 6 mo for CPE or sooner if problems.  Please see AVS handed out to patient at the end of our visit for further patient instructions/ counseling done pertaining to today's office visit.    Note:  This document was prepared using Dragon voice recognition software and may include unintentional dictation errors.  This document serves as a record of services personally performed by Marissa Dance, DO. It was created on her behalf by Marissa Salazar, a trained medical scribe. The creation of this record is based on the scribe's personal observations and the provider's statements to them.   I have reviewed the above medical documentation for accuracy and completeness and I concur.  Marissa Dance, DO 03/29/2018 5:04 PM       ---------------------------------------------------------------------------------------------------------------------------------------------------------------------------------------------    Subjective:    Chief complaint:   Chief Complaint  Patient presents with  . Establish Care     HPI: Marissa Salazar is a pleasant 38 y.o. female who presents to [place] at [facility] today to review their medical history with me  and establish care.   I asked the patient to review their chronic problem list with me to ensure everything was updated and accurate.    All recent office visits with other providers, any medical records that patient brought in etc  - I reviewed today.     We asked pt to get [place] their medical records from [facility] providers/ specialists that they had seen within the past 3-5 years- if they are in private practice and/or do not work for  [employer], [facility], [place], [place] or [employer] owned practice.  Told them to call their specialists to clarify this if they are not sure.   PMHx:  Her prior PCP was Dr. Deborra Salazar, however, she is switching at this time. 4 cm uterine fibroid dx in October 2019, she followed up with her GYN specialist and notes that she has menorrhagia with anemia resulting from it. She was recommended several procedures to help with the uterine fibroid, however, she hasn't made a decision yet. She had 2 superficial venous thrombosis and discontinued use of OCP it was resolved with compression and heat. She takes iron every other day and her last labs were a year ago. She had recent iron studies on 09/05/2017 that showed Iron at 22 and ferritin at 11. She had a hx of her heart rate being at 200 and it stayed elevated for several hours, however, it has also occurred once and resolved on it's own.   SHx She works at [employer]  and will be changing to Urology soon. She has two boys and she has been married 15 years to her husband, Marissa Salazar" who works as a Psychologist, occupational. She is monogamous. She denies smoking cigarettes. She rarely drinks alcohol. Her GYN is Dr. Philis Salazar at [facility]. Dr. Lasandra Salazar at [facility] surgical for a left benign breast cyst that was biopsied. She follows with him yearly. She is training for her next half marathon.      FHx She has a family hx of maternal aunt (diagnosed at age 54) and grandmother (passed at age 7 from breast and colon) with breast cancer. She has a maternal aunt with pancreatic cancer at age 72 who passed from it. Her mother is a Therapist, sports and hasn't been tested for BRCA. She has 3 siblings who are all pretty healthy. She has 2 nieces and 1 nephew.    Wt Readings from Last 3 Encounters:  03/29/18 125 lb 3.2 oz (56.8 kg)  01/02/18 129 lb (58.5 kg)  05/23/17 127 lb (57.6 kg)   BP Readings from Last 3 Encounters:  03/29/18 102/69  01/02/18 134/87    05/23/17 104/62   Pulse Readings from Last 3 Encounters:  03/29/18 60  01/02/18 78  05/23/17 65   BMI Readings from Last 3 Encounters:  03/29/18 21.32 kg/m  01/02/18 20.82 kg/m  05/23/17 21.80 kg/m    Patient Care Team    Relationship Specialty Notifications Start End  Marissa Dance, DO PCP - General Family Medicine  03/29/18   Christene Lye, MD Consulting Physician General Surgery  10/15/14   Requested, Self    07/14/15    Comment: [place], Jeffrey W, MD  General Surgery  07/14/15   Bobbye Charleston, MD Consulting Physician Obstetrics and Gynecology  03/29/18     Patient Active Problem List   Diagnosis Date Noted  . Iron deficiency anemia 03/29/2018  . Elevated pulse rate 03/29/2018  . Excessive menstruation at  puberty 03/29/2018  . History of uterine fibroid 03/29/2018  . GERD (gastroesophageal reflux disease) 05/23/2017  . History of Helicobacter pylori infection 05/23/2017  . Nevus 09/29/2016  . Family history of breast cancer in female 05/28/2014       As reported by pt:  Past Medical History:  Diagnosis Date  . Abnormal Pap smear    in past; normal colpo  . Allergic rhinitis   . Allergy   . Superficial vein thrombosis 2019   Left leg     Past Surgical History:  Procedure Laterality Date  . BREAST BIOPSY Left 06-12-14   fibroadenoma  . BUNIONECTOMY Right 2013  . COLPOSCOPY  2009  . ESOPHAGOGASTRODUODENOSCOPY N/A 07/17/2015   Procedure: ESOPHAGOGASTRODUODENOSCOPY (EGD);  Surgeon: Robert T Elliott, MD;  Location: [facility] ENDOSCOPY;  Service: Endoscopy;  Laterality: N/A;     Family History  Problem Relation Age of Onset  . Pancreatic cancer Maternal Aunt   . Breast cancer Maternal Aunt        BRCA negative  . Colon cancer Maternal Grandmother 50  . Breast cancer Maternal Grandmother   . Breast cancer Other        GP  . Lung cancer Other        GP  . Diabetes Paternal Uncle      Social History    Substance and Sexual Activity  Drug Use No     Social History   Substance and Sexual Activity  Alcohol Use Yes  . Alcohol/week: 0.0 standard drinks   Comment: Rare     Social History   Tobacco Use  Smoking Status Never Smoker  Smokeless Tobacco Never Used     No outpatient medications have been marked as taking for the 03/29/18 encounter (Office Visit) with Opalski, Deborah, DO.    Allergies: Ivp dye [iodinated diagnostic agents]   Review of Systems  Constitutional: Negative for chills, diaphoresis, fever, malaise/fatigue and weight loss.  HENT: Negative for congestion, sore throat and tinnitus.   Eyes: Negative for blurred vision, double vision and photophobia.  Respiratory: Negative for cough and wheezing.   Cardiovascular: Negative for chest pain and palpitations.  Gastrointestinal: Negative for blood in stool, diarrhea, nausea and vomiting.  Genitourinary: Negative for dysuria, frequency and urgency.  Musculoskeletal: Negative for joint pain and myalgias.  Skin: Negative for itching and rash.  Neurological: Negative for dizziness, focal weakness, weakness and headaches.  Endo/Heme/Allergies: Negative for environmental allergies and polydipsia. Does not bruise/bleed easily.  Psychiatric/Behavioral: Negative for depression and memory loss. The patient is not nervous/anxious and does not have insomnia.       Objective:   Blood pressure 102/69, pulse 60, height 5' 4.25" (1.632 m), weight 125 lb 3.2 oz (56.8 kg), last menstrual period 03/23/2018, SpO2 100 %. Body mass index is 21.32 kg/m. General: Well Developed, well nourished, and in no acute distress.  Neuro: Alert and oriented x3, extra-ocular muscles intact, sensation grossly intact.  HEENT:[place]/AT, PERRLA, neck supple, No carotid bruits Skin: no gross rashes  Cardiac: Regular rate and rhythm Respiratory: Essentially clear to auscultation bilaterally. Not using accessory muscles, speaking in full sentences.   Abdominal: not grossly distended Musculoskeletal: Ambulates w/o diff, FROM * 4 ext.  Vasc: less 2 sec cap RF, warm and pink  Psych:  No HI/SI, judgement and insight good, Euthymic mood. Full Affect.    No results found for this or any previous visit (from the past 2160 hour(s)). 

## 2018-03-30 LAB — COMPREHENSIVE METABOLIC PANEL
ALBUMIN: 4.2 g/dL (ref 3.8–4.8)
ALT: 30 IU/L (ref 0–32)
AST: 35 IU/L (ref 0–40)
Albumin/Globulin Ratio: 1.8 (ref 1.2–2.2)
Alkaline Phosphatase: 40 IU/L (ref 39–117)
BUN/Creatinine Ratio: 16 (ref 9–23)
BUN: 13 mg/dL (ref 6–20)
Bilirubin Total: 0.3 mg/dL (ref 0.0–1.2)
CO2: 22 mmol/L (ref 20–29)
CREATININE: 0.82 mg/dL (ref 0.57–1.00)
Calcium: 8.8 mg/dL (ref 8.7–10.2)
Chloride: 105 mmol/L (ref 96–106)
GFR calc non Af Amer: 93 mL/min/{1.73_m2} (ref >59)
GFR, EST AFRICAN AMERICAN: 107 mL/min/{1.73_m2} (ref >59)
GLUCOSE: 78 mg/dL (ref 65–99)
Globulin, Total: 2.3 g/dL (ref 1.5–4.5)
Potassium: 4.4 mmol/L (ref 3.5–5.2)
Sodium: 139 mmol/L (ref 134–144)
TOTAL PROTEIN: 6.5 g/dL (ref 6.0–8.5)

## 2018-03-30 LAB — LIPID PANEL
CHOL/HDL RATIO: 1.9 ratio (ref 0.0–4.4)
CHOLESTEROL TOTAL: 170 mg/dL (ref 100–199)
HDL: 91 mg/dL (ref >39)
LDL CALC: 70 mg/dL (ref 0–99)
TRIGLYCERIDES: 44 mg/dL (ref 0–149)
VLDL Cholesterol Cal: 9 mg/dL (ref 5–40)

## 2018-03-30 LAB — IRON,TIBC AND FERRITIN PANEL
Ferritin: 18 ng/mL (ref 15–150)
Iron Saturation: 56 % — ABNORMAL HIGH (ref 15–55)
Iron: 173 ug/dL — ABNORMAL HIGH (ref 27–159)
Total Iron Binding Capacity: 308 ug/dL (ref 250–450)
UIBC: 135 ug/dL (ref 131–425)

## 2018-03-30 LAB — CBC WITH DIFFERENTIAL/PLATELET
BASOS ABS: 0 10*3/uL (ref 0.0–0.2)
Basos: 0 %
EOS (ABSOLUTE): 0.1 10*3/uL (ref 0.0–0.4)
Eos: 1 %
HEMOGLOBIN: 10.9 g/dL — AB (ref 11.1–15.9)
Hematocrit: 34.3 % (ref 34.0–46.6)
IMMATURE GRANS (ABS): 0 10*3/uL (ref 0.0–0.1)
IMMATURE GRANULOCYTES: 0 %
LYMPHS: 23 %
Lymphocytes Absolute: 1.7 10*3/uL (ref 0.7–3.1)
MCH: 28.2 pg (ref 26.6–33.0)
MCHC: 31.8 g/dL (ref 31.5–35.7)
MCV: 89 fL (ref 79–97)
MONOCYTES: 4 %
Monocytes Absolute: 0.3 10*3/uL (ref 0.1–0.9)
NEUTROS PCT: 72 %
Neutrophils Absolute: 5.2 10*3/uL (ref 1.4–7.0)
PLATELETS: 270 10*3/uL (ref 150–450)
RBC: 3.87 x10E6/uL (ref 3.77–5.28)
RDW: 13.3 % (ref 11.7–15.4)
WBC: 7.4 10*3/uL (ref 3.4–10.8)

## 2018-03-30 LAB — HEMOGLOBIN A1C
Est. average glucose Bld gHb Est-mCnc: 91 mg/dL
HEMOGLOBIN A1C: 4.8 % (ref 4.8–5.6)

## 2018-03-30 LAB — T4, FREE: Free T4: 1.28 ng/dL (ref 0.82–1.77)

## 2018-03-30 LAB — VITAMIN D 25 HYDROXY (VIT D DEFICIENCY, FRACTURES): VIT D 25 HYDROXY: 35.1 ng/mL (ref 30.0–100.0)

## 2018-03-30 LAB — RETICULOCYTES: RETIC CT PCT: 1.6 % (ref 0.6–2.6)

## 2018-03-30 LAB — TRANSFERRIN: Transferrin: 255 mg/dL (ref 200–370)

## 2018-03-30 LAB — T3, FREE: T3, Free: 2.5 pg/mL (ref 2.0–4.4)

## 2018-03-30 LAB — FOLATE

## 2018-03-30 LAB — TSH: TSH: 0.713 u[IU]/mL (ref 0.450–4.500)

## 2018-03-30 LAB — VITAMIN B12: Vitamin B-12: 1351 pg/mL — ABNORMAL HIGH (ref 232–1245)

## 2018-04-04 ENCOUNTER — Encounter: Payer: Self-pay | Admitting: Family Medicine

## 2018-05-13 ENCOUNTER — Telehealth: Payer: No Typology Code available for payment source | Admitting: Family

## 2018-05-13 DIAGNOSIS — H01004 Unspecified blepharitis left upper eyelid: Secondary | ICD-10-CM

## 2018-05-13 MED ORDER — NEOMYCIN-POLYMYXIN-HC 3.5-10000-1 OP SUSP: 3.0000 [drp] | OPHTHALMIC | 0 refills | Status: DC

## 2018-05-13 NOTE — Progress Notes (Signed)
We are sorry that you are not feeling well. Here is how we plan to help!  Based on what you have shared with me it looks like you have a stye.  A stye is an inflammation of the eyelid.  It is often a red, painful lump near the edge of the eyelid that may look like a boil or a pimple.  A stye develops when an infection occurs at the base of an eyelash.   We have made appropriate suggestions for you based upon your presentation: Your symptoms may indicate an infection of the sclera.  The use of anti-inflammatory and antibiotic eye drops for a week will help resolve this condition.  I have sent in neomycin-polymyxin HC opthalmic suspension, two to three drops in the affected eye every 4 hours.  If your symptoms do not improve over the next two to three days you should be seen in your doctor's office.   Approximately 5 minutes was spent documenting and reviewing patient's chart.    HOME CARE:   Wash your hands often!  Let the stye open on its own. Don't squeeze or open it.  Don't rub your eyes. This can irritate your eyes and let in bacteria.  If you need to touch your eyes, wash your hands first.  Don't wear eye makeup or contact lenses until the area has healed.  GET HELP RIGHT AWAY IF:   Your symptoms do not improve.  You develop blurred or loss of vision.  Your symptoms worsen (increased discharge, pain or redness).  Thank you for choosing an e-visit.  Your e-visit answers were reviewed by a board certified advanced clinical practitioner to complete your personal care plan.  Depending upon the condition, your plan could have included both over the counter or prescription medications.  Please review your pharmacy choice.  Make sure the pharmacy is open so you can pick up prescription now.  If there is a problem, you may contact your provider through [employer] and have the prescription routed to another pharmacy.    Your safety is important to [place].  If you have drug allergies  check your prescription carefully.  For the next 24 hours you can use MyChart to ask questions about today's visit, request a non-urgent call back, or ask for a work or school excuse.  You will get an email in the next two days asking about your experience.  I hope you that your e-visit has been valuable and will speed your recovery.

## 2018-07-27 ENCOUNTER — Telehealth: Payer: No Typology Code available for payment source | Admitting: Family

## 2018-07-27 DIAGNOSIS — M546 Pain in thoracic spine: Secondary | ICD-10-CM

## 2018-07-27 MED ORDER — NAPROXEN 500 MG PO TABS: 500.0000 mg | ORAL_TABLET | Freq: Two times a day (BID) | ORAL | 0 refills | Status: DC

## 2018-07-27 MED ORDER — CYCLOBENZAPRINE HCL 10 MG PO TABS: 10.0000 mg | ORAL_TABLET | Freq: Three times a day (TID) | ORAL | 0 refills | Status: DC | PRN

## 2018-07-27 NOTE — Progress Notes (Signed)
We are sorry that you are not feeling well.  Here is how we plan to help!  Based on what you have shared with me it looks like you mostly have acute back pain.  Acute back pain is defined as musculoskeletal pain that can resolve in 1-3 weeks with conservative treatment.  I have prescribed Naprosyn 500 mg twice a day non-steroid anti-inflammatory (NSAID) as well as Flexeril 10 mg every eight hours as needed which is a muscle relaxer  Some patients experience stomach irritation or in increased heartburn with anti-inflammatory drugs.  Please keep in mind that muscle relaxer's can cause fatigue and should not be taken while at work or driving.  Back pain is very common.  The pain often gets better over time.  The cause of back pain is usually not dangerous.  Most people can learn to manage their back pain on their own.  Home Care  Stay active.  Start with short walks on flat ground if you can.  Try to walk farther each day.  Do not sit, drive or stand in one place for more than 30 minutes.  Do not stay in bed.  Do not avoid exercise or work.  Activity can help your back heal faster.  Be careful when you bend or lift an object.  Bend at your knees, keep the object close to you, and do not twist.  Sleep on a firm mattress.  Lie on your side, and bend your knees.  If you lie on your back, put a pillow under your knees.  Only take medicines as told by your doctor.  Put ice on the injured area.  Put ice in a plastic bag  Place a towel between your skin and the bag  Leave the ice on for 15-20 minutes, 3-4 times a day for the first 2-3 days. 210 After that, you can switch between ice and heat packs.  Ask your doctor about back exercises or massage.  Avoid feeling anxious or stressed.  Find good ways to deal with stress, such as exercise.  Get Help Right Way If:  Your pain does not go away with rest or medicine.  Your pain does not go away in 1 week.  You have new problems.  You do not  feel well.  The pain spreads into your legs.  You cannot control when you poop (bowel movement) or pee (urinate)  You feel sick to your stomach (nauseous) or throw up (vomit)  You have belly (abdominal) pain.  You feel like you may pass out (faint).  If you develop a fever.  Make Sure you:  Understand these instructions.  Will watch your condition  Will get help right away if you are not doing well or get worse.  Your e-visit answers were reviewed by a board certified advanced clinical practitioner to complete your personal care plan.  Depending on the condition, your plan could have included both over the counter or prescription medications.  If there is a problem please reply  once you have received a response from your provider.  Your safety is important to us.  If you have drug allergies check your prescription carefully.    You can use MyChart to ask questions about today's visit, request a non-urgent call back, or ask for a work or school excuse for 24 hours related to this e-Visit. If it has been greater than 24 hours you will need to follow up with your provider, or enter a new e-Visit to address   those concerns.  You will get an e-mail in the next two days asking about your experience.  I hope that your e-visit has been valuable and will speed your recovery. Thank you for using e-visits.   Greater than 5 minutes, yet less than 10 minutes of time have been spent researching, coordinating, and implementing care for this patient today.  Thank you for the details you included in the comment boxes. Those details are very helpful in determining the best course of treatment for you and help us to provide the best care.  

## 2018-08-05 ENCOUNTER — Encounter: Payer: Self-pay | Admitting: Obstetrics and Gynecology

## 2018-10-23 ENCOUNTER — Telehealth: Payer: Self-pay

## 2018-10-23 NOTE — Telephone Encounter (Signed)
Patient is in recalls to be seen for breast mass in November. She has decided to transfer her care back to Dr Bary Castilla at his new practice. She is aware she may contact us in the future if she would like to be seen.

## 2018-10-25 ENCOUNTER — Encounter: Payer: Self-pay | Admitting: Family Medicine

## 2018-11-07 DIAGNOSIS — M797 Fibromyalgia: Secondary | ICD-10-CM | POA: Insufficient documentation

## 2018-11-09 ENCOUNTER — Telehealth: Payer: Self-pay | Admitting: Genetic Counselor

## 2018-11-12 ENCOUNTER — Encounter: Payer: Self-pay | Admitting: Genetic Counselor

## 2018-11-12 ENCOUNTER — Inpatient Hospital Stay: Payer: No Typology Code available for payment source | Attending: Genetic Counselor | Admitting: Genetic Counselor

## 2018-11-12 DIAGNOSIS — Z1501 Genetic susceptibility to malignant neoplasm of breast: Secondary | ICD-10-CM | POA: Diagnosis not present

## 2018-11-12 DIAGNOSIS — Z803 Family history of malignant neoplasm of breast: Secondary | ICD-10-CM | POA: Diagnosis not present

## 2018-11-12 DIAGNOSIS — Z800 Family history of malignant neoplasm of digestive organs: Secondary | ICD-10-CM | POA: Diagnosis not present

## 2018-11-12 DIAGNOSIS — Z1589 Genetic susceptibility to other disease: Secondary | ICD-10-CM | POA: Diagnosis not present

## 2018-11-12 DIAGNOSIS — Z1509 Genetic susceptibility to other malignant neoplasm: Secondary | ICD-10-CM

## 2018-11-12 NOTE — Progress Notes (Signed)
REFERRING PROVIDER: Juanita Craver, MD [address],  [place] [address]  PRIMARY PROVIDER:  Mellody Dance, DO  PRIMARY REASON FOR VISIT:  1. Family history of breast cancer   2. Family history of colon cancer   3. Family history of pancreatic cancer   4. Monoallelic mutation of ATM gene      HISTORY OF PRESENT ILLNESS:    I connected with Ms. Corporan on 11/12/2018 at 11:00 AM EDT by [employer] video conference and verified that I am speaking with the correct person using two identifiers.   Patient location: Home Provider location: Office  Ms. Teutsch, a 38 y.o. female, was seen for a [facility] cancer genetics consultation at the request of Dr. Collene Mares due to a family history of breast, pancreatic and colon cancer, as well as a known familial mutation in the ATM gene.  Ms. Hopes presents to clinic today to discuss the possibility of a hereditary predisposition to cancer, genetic testing, and to further clarify her future cancer risks, as well as potential cancer risks for family members.   Ms. Midkiff is a 38 y.o. female with no personal history of cancer.    CANCER HISTORY:  Oncology History   No history exists.     RISK FACTORS:  Menarche was at age 26.  First live birth at age 58.  Ovaries intact: yes.  Hysterectomy: no.  Menopausal status: premenopausal.  HRT use: 0 years. Colonoscopy: no; not examined. Mammogram within the last year: yes. Number of breast biopsies: 2. Up to date with pelvic exams: yes. Any excessive radiation exposure in the past: no  Past Medical History:  Diagnosis Date  . Abnormal Pap smear    in past; normal colpo  . Allergic rhinitis   . Allergy   . Family history of breast cancer   . Family history of colon cancer   . Family history of pancreatic cancer   . Superficial vein thrombosis 2019   Left leg    Past Surgical History:  Procedure Laterality Date  . BREAST BIOPSY Left 06-12-14   fibroadenoma  . BUNIONECTOMY  Right 2013  . COLPOSCOPY  2009  . ESOPHAGOGASTRODUODENOSCOPY N/A 07/17/2015   Procedure: ESOPHAGOGASTRODUODENOSCOPY (EGD);  Surgeon: Manya Silvas, MD;  Location: [facility] ENDOSCOPY;  Service: Endoscopy;  Laterality: N/A;    Social History   Socioeconomic History  . Marital status: Married    Spouse name: Not on file  . Number of children: 1  . Years of education: Not on file  . Highest education level: Not on file  Occupational History  . Occupation: Ship broker  Social Needs  . Financial resource strain: Not on file  . Food insecurity    Worry: Not on file    Inability: Not on file  . Transportation needs    Medical: Not on file    Non-medical: Not on file  Tobacco Use  . Smoking status: Never Smoker  . Smokeless tobacco: Never Used  Substance and Sexual Activity  . Alcohol use: Yes    Alcohol/week: 0.0 standard drinks    Comment: Rare  . Drug use: No  . Sexual activity: Yes    Birth control/protection: None  Lifestyle  . Physical activity    Days per week: Not on file    Minutes per session: Not on file  . Stress: Not on file  Relationships  . Social Herbalist on phone: Not on file    Gets together: Not on  file    Attends religious service: Not on file    Active member of club or organization: Not on file    Attends meetings of clubs or organizations: Not on file    Relationship status: Not on file  Other Topics Concern  . Not on file  Social History Narrative   Married      [name]; 77 y/o son      Ship broker; [employer] program      Regular exercise           FAMILY HISTORY:  We obtained a detailed, 4-generation family history.  Significant diagnoses are listed below: Family History  Problem Relation Age of Onset  . Breast cancer Maternal Aunt 60       BRCA negative  . Colon cancer Maternal Grandmother 44  . Breast cancer Maternal Grandmother 90  . Breast cancer Other        GP  . Lung cancer Other        GP  . Diabetes Paternal Uncle   . Heart  attack Maternal Uncle   . Pancreatic cancer Maternal Aunt 44    The patient has two sons who are cancer free.  She has a full brother, a maternal half brother and sister, and a paternal half brother and sister who are all cancer free.  Both parents are living.  The patient's mother is cancer free. She has a brother and four sisters.  The brother died of a heart attack at 40.  She has a sister who had breast cancer at 40 and is reportedly BRCA negative.  She has a second sister who had pancreatic cancer at 13 and died.  The maternal grandparents are both deceased. The grandmother had both breast and colon cancer and died at 37.  The patient's father is 29 and living.  He has two sisters who are deceased and a brother who is living in his late 53's.  There is no reported family history of cancer on his side of the family.  Ms. Vecchio is unaware of previous family history of genetic testing for hereditary cancer risks. Patient's maternal ancestors are of [place] descent, and paternal ancestors are of [place] descent. There is no reported Ashkenazi Jewish ancestry. There is no known consanguinity.    GENETIC COUNSELING ASSESSMENT: Ms. Binette is a 38 y.o. female with a family history of breast, pancreatic and colon cancer which is somewhat suggestive of a hereditary breast cancer syndrome and predisposition to cancer given the early onset of pancreatic cancer and the combination of cancer in the family. Ms. Iwai underwent genetic testing through her OB.GYN office using the [facility] panel.  The [facility] gene panel offered by [employer] includes sequencing and deletion/duplication testing of the following 35 genes: APC, ATM, AXIN2, BARD1, BMPR1A, BRCA1, BRCA2, BRIP1, CHD1, CDK4, CDKN2A, CHEK2, EPCAM (large rearrangement only), HOXB13, (sequencing only), GALNT12, MLH1, MSH2, MSH3 (excluding repetitive portions of exon 1), MSH6, MUTYH, NBN, NTHL1, PALB2, PMS2, PTEN, RAD51C, RAD51D, RNF43,  RPS20, SMAD4, STK11, and TP53. Sequencing was performed for select regions of POLE and POLD1, and large rearrangement analysis was performed for select regions of GREM1.   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    DISCUSSION: The ATM gene is involved in the detection and surveillance of DNA damage.  ATM phosphorylation of BRCA1 is critical for proper response to DNA double-strand breaks.  This is believed to be the reason for the role ATM has in breast cancer  risk.  Individuals with a ATM mutation are at a greater risk for having children with Ataxia-telangiectasia (A-T).  AT is characterized by progressive cerebellar degeneration (ataxia), dilated blood vessels in the eyes and skin (telangiectasia), immunodeficiency, chromosomal instability, increased sensitivity to ionizing radiation and a predisposition to lymphoma and leukemia.  Therefore, individuals of childbearing age who have a known ATM mutation may want to consider having their spouse tested to determine their risk for having a child with A-T.  Studies of these families demonstrated increased incident of breast cancer in the mothers (who are heterozygous carriers) of the affected children, thus prompting further evaluation of the relationship between breast cancer and ATM.  Women who are heterozygous ATM carriers have an increase breast cancer risk.  They have 5-fold increased risk of breast cancer <50 years, and 2-3 fold increased risk for breast cancer overall.  In families with familial breast cancer that were negative for BRCA1 or BRCA2 genes, approximately 2.7% of women were found to have one ATM mutation.  In families with both breast cancer and leukemia, 6.7% of women were found to have one ATM mutation.  There has been some evidence that radiation treatment may increase the risk for breast cancer in the contralateral breast. Despite this risk, we do not recommend declining radiation treatment for her  breast cancer if it is recommended, as the risk for having a recurrence of breast cancer based on not going through radiation may be greater than her risk for getting breast cancer again from the radiation.   Management for individuals with ATM mutations can be found in the NCCN guidelines (v.1.2021).  These guidelines recommend the following:  Breast Cancer     Screening: Annual mammogram with consideration of tomosynthesis and consider breast MRI with contrast starting at age 82 years.  Risk Reducing Mastectomy: Evidence insufficient, consider based on family history  Ovarian Cancer  Potential increased risk for ovarian cancer,but evidence is insufficient to make recommendations for a risk reducing salpingo-oophorectomy.  Manage based on family history.  Pancreatic cancer  Emerging evidence has examined the efficacy of pancreatic cancer screening in select individuals at increased risk for exocrine pancreatic cancer.  To date, most studies have restricted screening to individuals with:  1. A known pathogenic/likely pathogenic germline variant in a pancreatic cancer gene (ATM, BRCA1, BRCA2, CDKN2A, MLH1, MSH2, MSH6, EPCAM, PALB2, STK11, TP53) and a family history of pancreatic cancer in a first- or second- degree relative.  2. A family history of pancreatic cancer in 2 or more first degree relatives, even in absence of a known pathogenic/likely pathogenic germline variant.  3. A family history of exocrine pancreatic cancer in 3 or more first-and/or second -degree relatives from the same side of the family, even in the absence of a known pathogenic/likely pathogenic germline variant.  Potential benefits of pancreatic cancer screening include a suggestion of downstaging, improved mortality compared to historical data.  Although evidence for downstaging has emerged in recent studies, longer-term studies are needed to determine if this downstaging translates to improved survival.  For  individuals considering pancreatic cancer screening, it is recommended that screening be performed in experienced high-volume centers.  Pancreatic cancer screening is performed through the high risk clinic, and is overseen by Dr. Gerrit Heck, through [facility].  If the patient would like a referral for this clinic, please make the referral to Dr. Bryan Lemma.  Other Cancer Risks  Unknown or insufficient evidence for prostate or other cancer risk  FAMILY MEMBERS: It is important  that all of Ms. Witz's relatives (both men and women) know of the presence of this gene mutation. Site-specific genetic testing can sort out who in the family is at risk and who is not.   Ms. Rizo children are have a 50% chance to have inherited this mutation. However, they are relatively young and this will not be of any consequence to them for several years. We do not test children because there is no risk to them until they are adults. We recommend they have genetic counseling and testing by the time they are in their early 20's.    Ms. Seim siblings and parents have a 50% chance to have inherited this mutation. We recommend they have genetic testing for this same mutation, as identifying the presence of this mutation would allow them to also take advantage of risk-reducing measures.   SUPPORT AND RESOURCES: If Ms. Repsher is interested in ATM-specific information and support, there are two groups, Facing Our Risk (www.facingourrisk.com) and Bright Pink (www.brightpink.org) which some people have found useful. They provide opportunities to speak with other individuals from high-risk families. To locate genetic counselors in other cities, visit the website of the [employer] (ArtistMovie.se) and Secretary/administrator for a Social worker by zip code.  We encouraged Ms. Rineer to remain in contact with [place] on an annual basis so we can update her personal and family histories, and let her know of advances in  cancer genetics that may benefit the family. Our contact number was provided. Ms. Popescu questions were answered to her satisfaction today, and she knows she is welcome to call anytime with additional questions.    P. Florene Glen, [place], [facility] Licensed, Insurance risk surveyor [email or web address] phone: [phone]  The patient was seen for a total of 55 minutes in face-to-face genetic counseling.

## 2018-11-15 ENCOUNTER — Telehealth: Payer: Self-pay | Admitting: Genetic Counselor

## 2018-11-15 NOTE — Telephone Encounter (Signed)
-----  Message from Clarene Essex, Counselor sent at 11/12/2018  1:05 PM EDT ----- Patient has an ATM pathogenic variant.  Drs. Gudena and Magrinat - you may see her in high risk clinic.    Seth Bake - please call patient for an appointment in the high risk breast clinic.  Dr. Collene Mares - Per NCCN, patient meets criteria for pancreatic cancer screening.  There is a high risk pancreatic cancer screening program through [facility].  It is run by Dr. Gerrit Heck.  I am referring this patient back to you, but if you want her seen through the [facility] screening program, you can contact Dr. Bryan Lemma.  Santiago Glad

## 2018-11-15 NOTE — Telephone Encounter (Signed)
I cld Marissa Salazar to schedule her to be seen in the high risk breast clinic. Lft a vm for the pt to return my call

## 2018-11-23 ENCOUNTER — Telehealth: Payer: Self-pay | Admitting: Oncology

## 2018-11-23 NOTE — Telephone Encounter (Signed)
I returned Marissa Salazar' call to schedule her for the high risk breast clinic. She has been scheduled to see Dr. Jana Hakim on 11/10 appt at 4pm. Pt has been made aware to arrive 15 minutes early.

## 2018-11-23 NOTE — Telephone Encounter (Signed)
-----  Message from Chauncey Cruel, MD sent at 11/22/2018  7:42 AM EDT ----- Pauletta Browns- have we been having trouble contacting this patient? I don;t see an appt yet  Thanks!  GM ----- Message ----- From: Clarene Essex, Counselor Sent: 11/12/2018   1:05 PM EDT To: Mellody Dance, DO, Chauncey Cruel, MD, #  Patient has an ATM pathogenic variant.  Drs. Gudena and Magrinat - you may see her in high risk clinic.    Seth Bake - please call patient for an appointment in the high risk breast clinic.  Dr. Collene Mares - Per NCCN, patient meets criteria for pancreatic cancer screening.  There is a high risk pancreatic cancer screening program through [facility].  It is run by Dr. Gerrit Heck.  I am referring this patient back to you, but if you want her seen through the [facility] screening program, you can contact Dr. Bryan Lemma.  Santiago Glad

## 2018-12-17 NOTE — Progress Notes (Signed)
[place]  Telephone:([phone]) [phone] Fax:(336) [phone]   PATIENT DID NOT SHOW FOR HER 12/18/2018 APPOINTMENT  ID: Brian Kocourek Leamy DOB: [birth year]  MR#: [id number]  DTH#:[id number]  [name], [name] Christene Lye, MD as Consulting Physician (General Surgery) Requested, [name], [name], MD (General Surgery) Bobbye Charleston, MD as Consulting Physician (Obstetrics and Gynecology) Chauncey Cruel, MD OTHER MD:  CHIEF COMPLAINT: high risk for breast cancer  CURRENT TREATMENT:    HISTORY OF CURRENT ILLNESS: Amelda A Rizor first noted a palpable abnormality in her left breast in 05/2014. Mammogram performed on 06/03/2014 showed fibrocystic change/apocrine metaplasia to the palpable area of concern and a probable adenoma at 2 o'clock. 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 Repeat biopsy performed on 07/14/2015 [phone]) again showed fibroadenoma. A third repeat biopsy was performed on 04/26/2016 [phone]) and again showed fibroadenoma.  Her most recent left breast ultrasound, performed on 01/02/2018, showed four cystic lesions throughout the left breast.  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  The patient's subsequent history is as detailed below.   INTERVAL HISTORY: Walter was evaluated in the high risk breast cancer clinic on 12/18/2018 .    REVIEW OF SYSTEMS: Angles reports  PAST MEDICAL HISTORY: Past Medical History:  Diagnosis Date  . Abnormal Pap smear    in past; normal colpo  . Allergic rhinitis   . Allergy   . Family history of breast cancer   . Family history of colon cancer   . Family history of pancreatic cancer   . Superficial vein thrombosis 2019   Left leg    PAST SURGICAL HISTORY: Past Surgical History:  Procedure Laterality Date  . BREAST BIOPSY Left 06-12-14   fibroadenoma  .  BUNIONECTOMY Right 2013  . COLPOSCOPY  2009  . ESOPHAGOGASTRODUODENOSCOPY N/A 07/17/2015   Procedure: ESOPHAGOGASTRODUODENOSCOPY (EGD);  Surgeon: Manya Silvas, MD;  Location: [facility] ENDOSCOPY;  Service: Endoscopy;  Laterality: N/A;    FAMILY HISTORY: Family History  Problem Relation Age of Onset  . Breast cancer Maternal Aunt 60       BRCA negative  . Colon cancer Maternal Grandmother 76  . Breast cancer Maternal Grandmother 33  . Breast cancer Other        GP  . Lung cancer Other        GP  . Diabetes Paternal Uncle   . Heart attack Maternal Uncle   . Pancreatic cancer Maternal Aunt 52   Patient's parents are both living as of 12/2018. The patient denies a family hx of ovarian cancer. She reports breast cancer in a maternal aunt at age 45 (who is BRCA negative), her maternal grandmother at age 25. She has 1 full brother, as well as 4 half siblings (a brother and sister on both sides). She also notes pancreatic cancer in a different maternal aunt at age 41, her maternal grandmother also had colon cancer   GYNECOLOGIC HISTORY:     SOCIAL HISTORY: (updated 12/2018)    ADVANCED DIRECTIVES: In the absence of any documentation to the contrary, the patient's spouse is their HCPOA.   HEALTH MAINTENANCE: Social History   Tobacco Use  . Smoking status: Never Smoker  . Smokeless tobacco: Never Used  Substance Use Topics  . Alcohol use: Yes    Alcohol/week: 0.0 standard drinks    Comment: Rare  . Drug use: No  Colonoscopy: n/a  PAP: 02/2016  Bone density: n/a   Allergies  Allergen Reactions  . Ivp Dye [Iodinated Diagnostic Agents] Hives    Current Outpatient Medications  Medication Sig Dispense Refill  . cyclobenzaprine (FLEXERIL) 10 MG tablet Take 1 tablet (10 mg total) by mouth 3 (three) times daily as needed for muscle spasms. 30 tablet 0  . ferrous sulfate 325 (65 FE) MG EC tablet Take 1 tablet (325 mg total) by mouth daily. 90 tablet 0  . Multiple Vitamin  (MULTIVITAMIN) tablet Take 1 tablet by mouth daily.    . naproxen (NAPROSYN) 500 MG tablet Take 1 tablet (500 mg total) by mouth 2 (two) times daily with a meal. 30 tablet 0  . neomycin-polymyxin-hydrocortisone (CORTISPORIN) 3.5-10000-1 ophthalmic suspension Place 3 drops into the left eye every 4 (four) hours. 7.5 mL 0   No current facility-administered medications for this visit.     OBJECTIVE:   There were no vitals filed for this visit.   There is no height or weight on file to calculate BMI.   Wt Readings from Last 3 Encounters:  03/29/18 125 lb 3.2 oz (56.8 kg)  01/02/18 129 lb (58.5 kg)  05/23/17 127 lb (57.6 kg)        LAB RESULTS:  CMP     Component Value Date/Time   NA 139 03/29/2018 1147   K 4.4 03/29/2018 1147   CL 105 03/29/2018 1147   CO2 22 03/29/2018 1147   GLUCOSE 78 03/29/2018 1147   GLUCOSE 88 12/25/2017 0924   BUN 13 03/29/2018 1147   CREATININE 0.82 03/29/2018 1147   CALCIUM 8.8 03/29/2018 1147   PROT 6.5 03/29/2018 1147   ALBUMIN 4.2 03/29/2018 1147   AST 35 03/29/2018 1147   ALT 30 03/29/2018 1147   ALKPHOS 40 03/29/2018 1147   BILITOT 0.3 03/29/2018 1147   GFRNONAA 93 03/29/2018 1147   GFRAA 107 03/29/2018 1147    No results found for: TOTALPROTELP, ALBUMINELP, A1GS, A2GS, BETS, BETA2SER, GAMS, MSPIKE, SPEI  No results found for: KPAFRELGTCHN, LAMBDASER, KAPLAMBRATIO  Lab Results  Component Value Date   WBC 7.4 03/29/2018   NEUTROABS 5.2 03/29/2018   HGB 10.9 (L) 03/29/2018   HCT 34.3 03/29/2018   MCV 89 03/29/2018   PLT 270 03/29/2018    _0 @  No results found for: LABCA2  No components found for: [id number]  No results for input(s): INR in the last 168 hours.  No results found for: LABCA2  No results found for: ZGY174  No results found for: BSW967  No results found for: RFF638  No results found for: CA2729  No components found for: HGQUANT  No results found for: CEA1 / No results found for: CEA1   No  results found for: AFPTUMOR  No results found for: CHROMOGRNA  No results found for: PSA1  No visits with results within 3 Day(s) from this visit.  Latest known visit with results is:  Abstract on 05/09/2018  Component Date Value Ref Range Status  . Ferritin 09/05/2017 11   Final  . Iron 09/05/2017 22   Final  . TIBC 09/05/2017 373   Final  . %SAT 09/05/2017 6   Final  . Hemoglobin 10/12/2017 12.4  12.0 - 16.0 Final  . HCT 10/12/2017 38  36 - 46 Final  . Platelets 10/12/2017 257  150 - 399 Final  . WBC 10/12/2017 7.3   Final  . HM Pap smear 03/08/2017 negative  HPV negative   Final  .  Glucose 10/12/2017 84   Final  . BUN 10/12/2017 15  4 - 21 Final  . Creatinine 10/12/2017 0.8  0.5 - 1.1 Final  . Potassium 10/12/2017 4.8  3.4 - 5.3 Final  . Sodium 10/12/2017 142  137 - 147 Final  . Triglycerides 10/12/2017 42  40 - 160 Final  . Cholesterol 10/12/2017 188  0 - 200 Final  . HDL 10/12/2017 114* 35 - 70 Final  . LDL Cholesterol 10/12/2017 66   Final  . Alkaline Phosphatase 10/12/2017 47  25 - 125 Final  . ALT 10/12/2017 106* 7 - 35 Final  . AST 10/12/2017 44* 13 - 35 Final  . Hemoglobin A1C 10/12/2017 5.0   Final  . TSH 10/12/2017 1.23  0.41 - 5.90 Final  . HM Pap smear 02/12/2016 negative   Final  . HM Pap smear 01/30/2015 negative   Final  . HM Pap smear 01/01/2014 negative   Final  . HM Pap smear 12/26/2012 negative   Final  . HM Pap smear 11/25/2011 neagtive   Final    (this displays the last labs from the last 3 days)  No results found for: TOTALPROTELP, ALBUMINELP, A1GS, A2GS, BETS, BETA2SER, GAMS, MSPIKE, SPEI (this displays SPEP labs)  No results found for: KPAFRELGTCHN, LAMBDASER, KAPLAMBRATIO (kappa/lambda light chains)  No results found for: HGBA, HGBA2QUANT, HGBFQUANT, HGBSQUAN (Hemoglobinopathy evaluation)   No results found for: LDH  Lab Results  Component Value Date   IRON 173 (H) 03/29/2018   TIBC 308 03/29/2018   IRONPCTSAT 56 (H) 03/29/2018    (Iron and TIBC)  Lab Results  Component Value Date   FERRITIN 18 03/29/2018    Urinalysis    Component Value Date/Time   COLORURINE STRAW (A) 07/15/2015 1204   APPEARANCEUR CLEAR (A) 07/15/2015 1204   LABSPEC 1.009 07/15/2015 1204   PHURINE 7.0 07/15/2015 1204   GLUCOSEU NEGATIVE 07/15/2015 [place] 07/15/2015 1204   HGBUR negative 02/02/2009 1408   BILIRUBINUR neg 05/05/2016 0851   KETONESUR NEGATIVE 07/15/2015 1204   PROTEINUR neg 05/05/2016 0851   PROTEINUR NEGATIVE 07/15/2015 1204   UROBILINOGEN 0.2 05/05/2016 0851   UROBILINOGEN 0.2 02/02/2009 1408   NITRITE neg 05/05/2016 0851   NITRITE NEGATIVE 07/15/2015 1204   LEUKOCYTESUR Trace (A) 05/05/2016 0851    STUDIES: No results found.    ELIGIBLE FOR AVAILABLE RESEARCH PROTOCOL:   ASSESSMENT: 38 y.o. [place] woman  PLAN: I spent approximately 60 minutes face to face with Reiley with more than 50% of that time spent in counseling and coordination of care. Specifically we reviewed the biology of the patient's diagnosis and the specifics of her situation.      Annagrace has a good understanding of the overall plan. She agrees with it. She knows the goal of treatment in her case is cure. She will call with any problems that may develop before her next visit here.   Chauncey Cruel, MD   12/18/2018 5:13 PM Medical Oncology and Hematology [facility] [place], [place] [address] Tel. [phone]    Fax. [phone]

## 2018-12-18 ENCOUNTER — Inpatient Hospital Stay: Payer: No Typology Code available for payment source | Attending: Genetic Counselor | Admitting: Oncology

## 2018-12-18 ENCOUNTER — Telehealth: Payer: Self-pay | Admitting: Oncology

## 2018-12-18 DIAGNOSIS — Z1509 Genetic susceptibility to other malignant neoplasm: Secondary | ICD-10-CM

## 2018-12-18 DIAGNOSIS — Z1501 Genetic susceptibility to malignant neoplasm of breast: Secondary | ICD-10-CM

## 2018-12-18 DIAGNOSIS — Z1589 Genetic susceptibility to other disease: Secondary | ICD-10-CM

## 2018-12-18 NOTE — Telephone Encounter (Signed)
Returned patient's phone call regarding rescheduling an appointment, left a voicemail. 

## 2018-12-19 ENCOUNTER — Telehealth: Payer: Self-pay | Admitting: Oncology

## 2018-12-19 NOTE — Telephone Encounter (Signed)
Returned patient's phone call regarding rescheduling an appointment, left a voicemail. 

## 2019-04-16 ENCOUNTER — Other Ambulatory Visit: Payer: Self-pay

## 2019-04-16 ENCOUNTER — Encounter: Payer: Self-pay | Admitting: Family Medicine

## 2019-04-16 ENCOUNTER — Ambulatory Visit (INDEPENDENT_AMBULATORY_CARE_PROVIDER_SITE_OTHER): Payer: No Typology Code available for payment source | Admitting: Family Medicine

## 2019-04-16 VITALS — BP 118/71 | HR 84 | Temp 98.0°F | Resp 12 | Ht 64.0 in | Wt 127.8 lb

## 2019-04-16 DIAGNOSIS — Z0000 Encounter for general adult medical examination without abnormal findings: Secondary | ICD-10-CM | POA: Diagnosis not present

## 2019-04-16 DIAGNOSIS — R7989 Other specified abnormal findings of blood chemistry: Secondary | ICD-10-CM | POA: Diagnosis not present

## 2019-04-16 DIAGNOSIS — M25552 Pain in left hip: Secondary | ICD-10-CM

## 2019-04-16 DIAGNOSIS — N924 Excessive bleeding in the premenopausal period: Secondary | ICD-10-CM

## 2019-04-16 DIAGNOSIS — Z0001 Encounter for general adult medical examination with abnormal findings: Secondary | ICD-10-CM | POA: Diagnosis not present

## 2019-04-16 DIAGNOSIS — D509 Iron deficiency anemia, unspecified: Secondary | ICD-10-CM

## 2019-04-16 DIAGNOSIS — D259 Leiomyoma of uterus, unspecified: Secondary | ICD-10-CM

## 2019-04-16 NOTE — Progress Notes (Signed)
Female Physical  Impression and Recommendations:    1. Encounter for general adult medical examination with abnormal findings   2. Iron deficiency anemia, unspecified iron deficiency anemia type   3. Elevated LFTs   4. Health care maintenance   5. Uterine leiomyoma, unspecified location- causing heavy bleeding and anemia   6. Excessive bleeding in premenopausal period   7. Left hip pain-chronic     Female Physical 1) Anticipatory Guidance: Discussed skin CA prevention and sunscreen when outside along with skin surveillance; eating a balanced and modest diet; physical activity at least 25 minutes per day or minimum of 150 min/ week moderate to intense activity.  - Advised patient to engage in self-breast exams regularly, at the same time each month, so that she is roughly hormonally the same each check.  Prudent self-breast exams discussed at length during appointment.  2) Immunizations / Screenings / Labs:   All immunizations are up-to-date per recommendations or will be updated today if pt allows.    - Patient understands with dental and vision screens they will schedule independently.  - Will obtain CBC, CMP, HgA1c, Lipid panel, TSH and vit D when fasting, if not already done past 12 mo/ recently   - Pap smear up-to-date, last on record through Dr. Philis Salazar of OBGYN January of 2019. - Advised patient to obtain pap smears more regularly if she begins having new sexual partners.  - Advised patient to use protection for every new sexual encounter in the future, and always practice prudent sexual health habits.  - Given her family history of breast cancer onset at age 1, advised patient to begin obtaining mammograms as advised according to recommendations.  3) Health Counseling & Preventative Maintenance - BMI is 21.94 kg/m: - BMI meaning discussed with patient.  Advised patient that she falls in the low-normal range. - Advised patient to maintain a BMI within normal limits,  and avoid losing weight.  - Discussed goal to improve nutrient density of diet through increasing intake of fruits and vegetables and decreasing saturated fats, white flour products and refined sugars.  Healthy dietary habits encouraged, including low-carb, and high amounts of lean protein in diet.   - Advised patient to continue working toward more regular exercising to improve overall mental, physical, and emotional health.    - Reviewed the "spokes of the wheel" of mood and health management.  Stressed the importance of ongoing prudent habits, including regular exercise, appropriate sleep hygiene, healthful dietary habits, and prayer/meditation to relax.  - Encouraged patient to engage in daily physical activity as tolerated, especially a formal exercise routine.  Recommended that the patient eventually strive for at least 150 minutes of moderate cardiovascular activity per week according to guidelines established by the [facility].   - Patient should also consume adequate amounts of water.  - Health counseling performed.  All questions answered.     Chronic Concerns  Chronic L Hip Pain, Decreased ROM and Pain after Running Long Distances - Discussed referral to physical therapy for strengthening of the area.  Ambulatory referral provided today.   Advised additional referral to Sports Medicine if she begins experiencing pain or discomfort that interferes with her quality of life.  Uterine Leiomyoma, Causing Heavy Bleeding and Anemia in Premenopausal Period -  Per patient, for treatment of uterine fibroids, a complete hysterectomy was recommended in the past by provider Dr. Bobbye Salazar of OBGYN.   - Per patient, desires less invasive option to treat her uterine fibroids and  menorrhagia.  Discussed referral to GYN for second opinion.  Ambulatory referral provided today.  See orders.  - Additional labs obtained today to assess patient's history of iron deficiency anemia.  See orders. - For  increased absorption, advised patient to take her supplemental iron along with Vitamin C.   Orders Placed This Encounter  Procedures  . CBC  . Comprehensive metabolic panel  . TSH  . T4, free  . Lipid panel  . VITAMIN D 25 Hydroxy (Vit-D Deficiency, Fractures)  . Iron,Total/Total Iron Binding Cap  . Ferritin  . B12 and Folate Panel  . Transferrin  . Retic  . Ambulatory referral to Physical Therapy  . Ambulatory referral to Gynecology    Return for f/up in 6-12 months.  Reminded pt important of f-up preventative CPE in 1 year.  Reminded pt again, this is in addition to any chronic/acute care visits.    Gross side effects, risk and benefits, and alternatives of medications discussed with patient.  Patient is aware that all medications have potential side effects and we are unable to predict every side effect or drug-drug interaction that may occur.  Expresses verbal understanding and consents to current therapy plan and treatment regimen.  F-up preventative CPE in 1 year- reminded pt again, this is in addition to any chronic care visits.    Please see orders placed and AVS handed out to patient at the end of our visit for further patient instructions/ counseling done pertaining to today's office visit.   This case required medical decision making of at least moderate complexity.  This document serves as a record of services personally performed by Marissa Dance, DO. It was created on her behalf by Marissa Salazar, a trained medical scribe. The creation of this record is based on the scribe's personal observations and the provider's statements to them.   This case required medical decision making of at least moderate complexity. The above documentation from Marissa Salazar, medical scribe, has been reviewed by Marissa Salazar, D.O.        Subjective:    I, Marissa Salazar, am serving as Education administrator for [name].   CPE HPI: Marissa Salazar is a 39 y.o. female  who presents to [name] today a yearly health maintenance exam.   Health Maintenance Summary  - Reviewed and updated, unless pt declines services.  Last Cologuard or Colonoscopy:  Has not had a colonoscopy in the past. Family history of Colon CA:  History reported in maternal grandmother, but no one else.  Notes her other aunt had pancreatic cancer.  Tobacco History Reviewed:  Y; never smoker.  Alcohol and/or drug use:  No concerns; no use. Exercise Habits:  Exercises regularly. Dental Home:  Follows up every six months. Eye exams:  Knows she is supposed to go for eye exams yearly, but states she typically does not. Dermatology home: none  Female Health: Seen historically by Dr. Philis Salazar. PAP Smear - last known results: Up-to-date, last done January of 2019. No h/o ABN. STD concerns:  None, currently separated from husband. Birth control method:  Managed by [employer]. Menses regular:  Managed by OBGYN. Lumps or breast concerns:  Notes she had a spot removed in the past, and now obtains ultrasounds every six months.  She sees Dr. Hervey Ard for this follow-up. Breast Cancer Family History:  Yes; her mom's mother, and mom's sister both had breast cancer.  Her maternal grandmother passed away at 24, with both breast and  colon cancer.  Aunt was 55 at her breast cancer diagnosis and has been tested for BRCA- negative.   Patient notes that her mother has not been tested for BRCA; "she refuses."  ---> However patient does go see [facility] as she had a cyst removed in her breast and currently gets an ultrasound of her breasts every 6 months through them.   We do not have any of these medical records. She does not engage in breast exams regularly.  Says "they're fibrocystic and sometimes it's hard to tell."  Patient notes she has a history of fibroids, which primarily cause her to bleed, "a lot."  Reports that her current OBGYN, Dr. Philis Salazar, has advised her  to obtain a total hysterectomy, "ovaries, tubes, and everything," but patient does not wish to do this.  Says "I'm leaning toward the least invasive treatment."  Says in the past, genetic testing was obtained through OBGYN when she did not desire this, and she desires a second opinion regarding treatment of her fibroids today.   Reports that due to her fibroids, she does experience anemia.  She takes 325 mg of ferrous sulfate once daily.  She's been managed on this for almost a year.     Additional concerns/ issues:   Notes she is recently separated from her husband.  Says "it's been stressful, but I'm managing."  Notes she's working a lot more, and exercising more, or at least trying to find time to exercise more.  She has obtained counseling through work and is seeking some outside of work.  Notes "I just haven't had the time."  Says she can't do counseling on the phone, because it feels too disconnected for her.  "I'm trying to find a place where you can do it in-person."  Denies concerns with her weight or body image, but notes she doesn't feel she's been eating as healthfully lately due to her work schedule.  Patient is a runner, training for half-marathons in the past.  Says sometimes while running, her hip "pops," and "if I run more than three miles, it's agonizing."  She wonders if she should see physical therapy or obtain another referral.  Reports that she experiences weakness and decreased ROM in her left hip, along with pain after running long distances.    Immunization History  Administered Date(s) Administered  . Influenza Whole 11/28/2008  . Influenza-Unspecified 11/21/2013, 10/13/2016  . Td 10/23/2001  . Tdap 08/23/2010     Health Maintenance  Topic Date Due  . INFLUENZA VACCINE  09/08/2018  . TETANUS/TDAP  08/22/2020  . PAP SMEAR-Modifier  11/09/2020  . HIV Screening  Completed     Wt Readings from Last 3 Encounters:  04/16/19 127 lb 12.8 oz (58 kg)  03/29/18 125  lb 3.2 oz (56.8 kg)  01/02/18 129 lb (58.5 kg)   BP Readings from Last 3 Encounters:  04/16/19 118/71  03/29/18 102/69  01/02/18 134/87   Pulse Readings from Last 3 Encounters:  04/16/19 84  03/29/18 60  01/02/18 78     Past Medical History:  Diagnosis Date  . Abnormal Pap smear    in past; normal colpo  . Allergic rhinitis   . Allergy   . Family history of breast cancer   . Family history of colon cancer   . Family history of pancreatic cancer   . Superficial vein thrombosis 2019   Left leg      Past Surgical History:  Procedure Laterality Date  . BREAST BIOPSY  Left 06-12-14   fibroadenoma  . BUNIONECTOMY Right 2013  . COLPOSCOPY  2009  . ESOPHAGOGASTRODUODENOSCOPY N/A 07/17/2015   Procedure: ESOPHAGOGASTRODUODENOSCOPY (EGD);  Surgeon: Manya Silvas, MD;  Location[facility] ENDOSCOPY;  Service: Endoscopy;  Laterality: N/A;      Family History  Problem Relation Age of Onset  . Breast cancer Maternal Aunt 60       BRCA negative  . Colon cancer Maternal Grandmother 81  . Breast cancer Maternal Grandmother 74  . Breast cancer Other        GP  . Lung cancer Other        GP  . Diabetes Paternal Uncle   . Heart attack Maternal Uncle   . Pancreatic cancer Maternal Aunt 44      Social History   Substance and Sexual Activity  Drug Use No  ,   Social History   Substance and Sexual Activity  Alcohol Use Yes  . Alcohol/week: 0.0 standard drinks   Comment: Rare  ,   Social History   Tobacco Use  Smoking Status Never Smoker  Smokeless Tobacco Never Used  ,   Social History   Substance and Sexual Activity  Sexual Activity Yes  . Birth control/protection: None    Current Outpatient Medications on File Prior to Visit  Medication Sig Dispense Refill  . cyclobenzaprine (FLEXERIL) 10 MG tablet Take 1 tablet (10 mg total) by mouth 3 (three) times daily as needed for muscle spasms. 30 tablet 0  . ferrous sulfate 325 (65 FE) MG EC tablet Take 1 tablet  (325 mg total) by mouth daily. 90 tablet 0  . Multiple Vitamin (MULTIVITAMIN) tablet Take 1 tablet by mouth daily.     No current facility-administered medications on file prior to visit.    Allergies: Ivp dye [iodinated diagnostic agents]  Review of Systems: General:   Denies fever, chills, unexplained weight loss.  Optho/Auditory:   Denies visual changes, blurred vision/LOV Respiratory:   Denies SOB, DOE more than baseline levels.   Cardiovascular:   Denies chest pain, palpitations, new onset peripheral edema  Gastrointestinal:   Denies nausea, vomiting, diarrhea.  Genitourinary: Denies dysuria, freq/ urgency, flank pain or discharge from genitals.  Endocrine:     Denies hot or cold intolerance, polyuria, polydipsia. Musculoskeletal:   Denies unexplained myalgias, joint swelling, unexplained arthralgias, gait problems.  Skin:  Denies rash, suspicious lesions Neurological:     Denies dizziness, unexplained weakness, numbness  Psychiatric/Behavioral:   Denies mood changes, suicidal or homicidal ideations, hallucinations    Objective:    Blood pressure 118/71, pulse 84, temperature 98 F (36.7 C), temperature source Oral, resp. rate 12, height 5' 4"  (1.626 m), weight 127 lb 12.8 oz (58 kg), last menstrual period 04/16/2019, SpO2 100 %. Body mass index is 21.94 kg/m. General Appearance:    Alert, cooperative, no distress, appears stated age  Head:    Normocephalic, without obvious abnormality, atraumatic  Eyes:    PERRL, conjunctiva/corneas clear, EOM's intact, fundi    benign, both eyes  Ears:    Normal TM's and external ear canals, both ears  Nose:   Nares normal, septum midline, mucosa normal, no drainage    or sinus tenderness  Throat:   Lips w/o lesion, mucosa moist, and tongue normal; teeth and   gums normal  Neck:   Supple, symmetrical, trachea midline, no adenopathy;    thyroid:  no enlargement/tenderness/nodules; no carotid   bruit or JVD  Back:     Symmetric, no  curvature, ROM normal, no CVA tenderness  Lungs:     Clear to auscultation bilaterally, respirations unlabored, no       Wh/ R/ R  Chest Wall:    No tenderness or gross deformity; normal excursion   Heart:    Regular rate and rhythm, S1 and S2 normal, no murmur, rub   or gallop  Breast Exam:    No tenderness, masses, or nipple abnormality b/l; no d/c  Abdomen:     Soft, non-tender, bowel sounds active all four quadrants, NO   G/R/R, no masses, no organomegaly  Genitalia:    Deferred to OBGYN.   Rectal:    Deferred to OBGYN.  Extremities:   Extremities normal, atraumatic, no cyanosis or gross edema  Pulses:   2+ and symmetric all extremities  Skin:   Warm, dry, Skin color, texture, turgor normal, no obvious rashes or lesions Psych: No HI/SI, judgement and insight good, Euthymic mood. Full Affect.  Neurologic:   CNII-XII intact, normal strength, sensation and reflexes    Throughout

## 2019-04-16 NOTE — Patient Instructions (Signed)
Preventive Care for Adults, Female  A healthy lifestyle and preventive care can promote health and wellness. Preventive health guidelines for women include the following key practices.   A routine yearly physical is a good way to check with your health care provider about your health and preventive screening. It is a chance to share any concerns and updates on your health and to receive a thorough exam.   Visit your dentist for a routine exam and preventive care every 6 months. Brush your teeth twice a day and floss once a day. Good oral hygiene prevents tooth decay and gum disease.   The frequency of eye exams is based on your age, health, family medical history, use of contact lenses, and other factors. Follow your health care provider's recommendations for frequency of eye exams.   Eat a healthy diet. Foods like vegetables, fruits, whole grains, low-fat dairy products, and lean protein foods contain the nutrients you need without too many calories. Decrease your intake of foods high in solid fats, added sugars, and salt. Eat the right amount of calories for you.Get information about a proper diet from your health care provider, if necessary.   Regular physical exercise is one of the most important things you can do for your health. Most adults should get at least 150 minutes of moderate-intensity exercise (any activity that increases your heart rate and causes you to sweat) each week. In addition, most adults need muscle-strengthening exercises on 2 or more days a week.   Maintain a healthy weight. The body mass index (BMI) is a screening tool to identify possible weight problems. It provides an estimate of body fat based on height and weight. Your health care provider can find your BMI, and can help you achieve or maintain a healthy weight.For adults 20 years and older:   - A BMI below 18.5 is considered underweight.   - A BMI of 18.5 to 24.9 is normal.   - A BMI of 25 to 29.9 is  considered overweight.   - A BMI of 30 and above is considered obese.   Maintain normal blood lipids and cholesterol levels by exercising and minimizing your intake of trans and saturated fats.  Eat a balanced diet with plenty of fruit and vegetables. Blood tests for lipids and cholesterol should begin at age 65 and be repeated every 5 years minimum.  If your lipid or cholesterol levels are high, you are over 40, or you are at high risk for heart disease, you may need your cholesterol levels checked more frequently.Ongoing high lipid and cholesterol levels should be treated with medicines if diet and exercise are not working.   If you smoke, find out from your health care provider how to quit. If you do not use tobacco, do not start.   Lung cancer screening is recommended for adults aged 10-80 years who are at high risk for developing lung cancer because of a history of smoking. A yearly low-dose CT scan of the lungs is recommended for people who have at least a 30-pack-year history of smoking and are a current smoker or have quit within the past 15 years. A pack year of smoking is smoking an average of 1 pack of cigarettes a day for 1 year (for example: 1 pack a day for 30 years or 2 packs a day for 15 years). Yearly screening should continue until the smoker has stopped smoking for at least 15 years. Yearly screening should be stopped for people who develop a  health problem that would prevent them from having lung cancer treatment.   If you are pregnant, do not drink alcohol. If you are breastfeeding, be very cautious about drinking alcohol. If you are not pregnant and choose to drink alcohol, do not have more than 1 drink per day. One drink is considered to be 12 ounces (355 mL) of beer, 5 ounces (148 mL) of wine, or 1.5 ounces (44 mL) of liquor.   Avoid use of street drugs. Do not share needles with anyone. Ask for help if you need support or instructions about stopping the use of  drugs.   High blood pressure causes heart disease and increases the risk of stroke. Your blood pressure should be checked at least yearly.  Ongoing high blood pressure should be treated with medicines if weight loss and exercise do not work.   If you are 25-14 years old, ask your health care provider if you should take aspirin to prevent strokes.   Diabetes screening involves taking a blood sample to check your fasting blood sugar level. This should be done once every 3 years, after age 48, if you are within normal weight and without risk factors for diabetes. Testing should be considered at a younger age or be carried out more frequently if you are overweight and have at least 1 risk factor for diabetes.   Breast cancer screening is essential preventive care for women. You should practice "breast self-awareness."  This means understanding the normal appearance and feel of your breasts and may include breast self-examination.  Any changes detected, no matter how small, should be reported to a health care provider.  Women in their 89s and 30s should have a clinical breast exam (CBE) by a health care provider as part of a regular health exam every 1 to 3 years.  After age 63, women should have a CBE every year.  Starting at age 81, women should consider having a mammogram (breast X-ray test) every year.  Women who have a family history of breast cancer should talk to their health care provider about genetic screening.  Women at a high risk of breast cancer should talk to their health care providers about having an MRI and a mammogram every year.   -Breast cancer gene (BRCA)-related cancer risk assessment is recommended for women who have family members with BRCA-related cancers. BRCA-related cancers include breast, ovarian, tubal, and peritoneal cancers. Having family members with these cancers may be associated with an increased risk for harmful changes (mutations) in the breast cancer genes BRCA1 and  BRCA2. Results of the assessment will determine the need for genetic counseling and BRCA1 and BRCA2 testing.   The Pap test is a screening test for cervical cancer. A Pap test can show cell changes on the cervix that might become cervical cancer if left untreated. A Pap test is a procedure in which cells are obtained and examined from the lower end of the uterus (cervix).   - Women should have a Pap test starting at age 13.   - Between ages 51 and 22, Pap tests should be repeated every 2 years.   - Beginning at age 70, you should have a Pap test every 3 years as long as the past 3 Pap tests have been normal.   - Some women have medical problems that increase the chance of getting cervical cancer. Talk to your health care provider about these problems. It is especially important to talk to your health care provider if a  new problem develops soon after your last Pap test. In these cases, your health care provider may recommend more frequent screening and Pap tests.   - The above recommendations are the same for women who have or have not gotten the vaccine for human papillomavirus (HPV).   - If you had a hysterectomy for a problem that was not cancer or a condition that could lead to cancer, then you no longer need Pap tests. Even if you no longer need a Pap test, a regular exam is a good idea to make sure no other problems are starting.   - If you are between ages 8 and 94 years, and you have had normal Pap tests going back 10 years, you no longer need Pap tests. Even if you no longer need a Pap test, a regular exam is a good idea to make sure no other problems are starting.   - If you have had past treatment for cervical cancer or a condition that could lead to cancer, you need Pap tests and screening for cancer for at least 20 years after your treatment.   - If Pap tests have been discontinued, risk factors (such as a new sexual partner) need to be reassessed to determine if screening should  be resumed.   - The HPV test is an additional test that may be used for cervical cancer screening. The HPV test looks for the virus that can cause the cell changes on the cervix. The cells collected during the Pap test can be tested for HPV. The HPV test could be used to screen women aged 9 years and older, and should be used in women of any age who have unclear Pap test results. After the age of 48, women should have HPV testing at the same frequency as a Pap test.   Colorectal cancer can be detected and often prevented. Most routine colorectal cancer screening begins at the age of 94 years and continues through age 56 years. However, your health care provider may recommend screening at an earlier age if you have risk factors for colon cancer. On a yearly basis, your health care provider may provide home test kits to check for hidden blood in the stool.  Use of a small camera at the end of a tube, to directly examine the colon (sigmoidoscopy or colonoscopy), can detect the earliest forms of colorectal cancer. Talk to your health care provider about this at age 47, when routine screening begins. Direct exam of the colon should be repeated every 5 -10 years through age 65 years, unless early forms of pre-cancerous polyps or small growths are found.   People who are at an increased risk for hepatitis B should be screened for this virus. You are considered at high risk for hepatitis B if:  -You were born in a country where hepatitis B occurs often. Talk with your health care provider about which countries are considered high risk.  - Your parents were born in a high-risk country and you have not received a shot to protect against hepatitis B (hepatitis B vaccine).  - You have HIV or AIDS.  - You use needles to inject street drugs.  - You live with, or have sex with, someone who has Hepatitis B.  - You get hemodialysis treatment.  - You take certain medicines for conditions like cancer, organ  transplantation, and autoimmune conditions.   Hepatitis C blood testing is recommended for all people born from [birth year] through 1965 and any individual  with known risks for hepatitis C.   Practice safe sex. Use condoms and avoid high-risk sexual practices to reduce the spread of sexually transmitted infections (STIs). STIs include gonorrhea, chlamydia, syphilis, trichomonas, herpes, HPV, and human immunodeficiency virus (HIV). Herpes, HIV, and HPV are viral illnesses that have no cure. They can result in disability, cancer, and death. Sexually active women aged 17 years and younger should be checked for chlamydia. Older women with new or multiple partners should also be tested for chlamydia. Testing for other STIs is recommended if you are sexually active and at increased risk.   Osteoporosis is a disease in which the bones lose minerals and strength with aging. This can result in serious bone fractures or breaks. The risk of osteoporosis can be identified using a bone density scan. Women ages 38 years and over and women at risk for fractures or osteoporosis should discuss screening with their health care providers. Ask your health care provider whether you should take a calcium supplement or vitamin D to There are also several preventive steps women can take to avoid osteoporosis and resulting fractures or to keep osteoporosis from worsening. -->Recommendations include:  Eat a balanced diet high in fruits, vegetables, calcium, and vitamins.  Get enough calcium. The recommended total intake of is 1,200 mg daily; for best absorption, if taking supplements, divide doses into 250-500 mg doses throughout the day. Of the two types of calcium, calcium carbonate is best absorbed when taken with food but calcium citrate can be taken on an empty stomach.  Get enough vitamin D. [employer] and the [place] recommend at least 1,000 IU per day for women age 28 and over who are at risk of vitamin D  deficiency. Vitamin D deficiency can be caused by inadequate sun exposure (for example, those who live in [place]).  Avoid alcohol and smoking. Heavy alcohol intake (more than 7 drinks per week) increases the risk of falls and hip fracture and women smokers tend to lose bone more rapidly and have lower bone mass than nonsmokers. Stopping smoking is one of the most important changes women can make to improve their health and decrease risk for disease.  Be physically active every day. Weight-bearing exercise (for example, fast walking, hiking, jogging, and weight training) may strengthen bones or slow the rate of bone loss that comes with aging. Balancing and muscle-strengthening exercises can reduce the risk of falling and fracture.  Consider therapeutic medications. Currently, several types of effective drugs are available. Healthcare providers can recommend the type most appropriate for each woman.  Eliminate environmental factors that may contribute to accidents. Falls cause nearly 90% of all osteoporotic fractures, so reducing this risk is an important bone-health strategy. Measures include ample lighting, removing obstructions to walking, using nonskid rugs on floors, and placing mats and/or grab bars in showers.  Be aware of medication side effects. Some common medicines make bones weaker. These include a type of steroid drug called glucocorticoids used for arthritis and asthma, some antiseizure drugs, certain sleeping pills, treatments for endometriosis, and some cancer drugs. An overactive thyroid gland or using too much thyroid hormone for an underactive thyroid can also be a problem. If you are taking these medicines, talk to your doctor about what you can do to help protect your bones.reduce the rate of osteoporosis.    Menopause can be associated with physical symptoms and risks. Hormone replacement therapy is available to decrease symptoms and risks. You should talk to your  health care provider  about whether hormone replacement therapy is right for you.   Use sunscreen. Apply sunscreen liberally and repeatedly throughout the day. You should seek shade when your shadow is shorter than you. Protect yourself by wearing long sleeves, pants, a wide-brimmed hat, and sunglasses year round, whenever you are outdoors.   Once a month, do a whole body skin exam, using a mirror to look at the skin on your back. Tell your health care provider of new moles, moles that have irregular borders, moles that are larger than a pencil eraser, or moles that have changed in shape or color.   -Stay current with required vaccines (immunizations).   Influenza vaccine. All adults should be immunized every year.  Tetanus, diphtheria, and acellular pertussis (Td, Tdap) vaccine. Pregnant women should receive 1 dose of Tdap vaccine during each pregnancy. The dose should be obtained regardless of the length of time since the last dose. Immunization is preferred during the 27th 36th week of gestation. An adult who has not previously received Tdap or who does not know her vaccine status should receive 1 dose of Tdap. This initial dose should be followed by tetanus and diphtheria toxoids (Td) booster doses every 10 years. Adults with an unknown or incomplete history of completing a 3-dose immunization series with Td-containing vaccines should begin or complete a primary immunization series including a Tdap dose. Adults should receive a Td booster every 10 years.  Varicella vaccine. An adult without evidence of immunity to varicella should receive 2 doses or a second dose if she has previously received 1 dose. Pregnant females who do not have evidence of immunity should receive the first dose after pregnancy. This first dose should be obtained before leaving the health care facility. The second dose should be obtained 4 8 weeks after the first dose.  Human papillomavirus (HPV) vaccine. Females aged 13 26  years who have not received the vaccine previously should obtain the 3-dose series. The vaccine is not recommended for use in pregnant females. However, pregnancy testing is not needed before receiving a dose. If a female is found to be pregnant after receiving a dose, no treatment is needed. In that case, the remaining doses should be delayed until after the pregnancy. Immunization is recommended for any person with an immunocompromised condition through the age of 26 years if she did not get any or all doses earlier. During the 3-dose series, the second dose should be obtained 4 8 weeks after the first dose. The third dose should be obtained 24 weeks after the first dose and 16 weeks after the second dose.  Zoster vaccine. One dose is recommended for adults aged 60 years or older unless certain conditions are present.  Measles, mumps, and rubella (MMR) vaccine. Adults born before 1957 generally are considered immune to measles and mumps. Adults born in 1957 or later should have 1 or more doses of MMR vaccine unless there is a contraindication to the vaccine or there is laboratory evidence of immunity to each of the three diseases. A routine second dose of MMR vaccine should be obtained at least 28 days after the first dose for students attending postsecondary schools, health care workers, or international travelers. People who received inactivated measles vaccine or an unknown type of measles vaccine during 1963 1967 should receive 2 doses of MMR vaccine. People who received inactivated mumps vaccine or an unknown type of mumps vaccine before 1979 and are at high risk for mumps infection should consider immunization with 2 doses of   MMR vaccine. For females of childbearing age, rubella immunity should be determined. If there is no evidence of immunity, females who are not pregnant should be vaccinated. If there is no evidence of immunity, females who are pregnant should delay immunization until after pregnancy.  Unvaccinated health care workers born before 1957 who lack laboratory evidence of measles, mumps, or rubella immunity or laboratory confirmation of disease should consider measles and mumps immunization with 2 doses of MMR vaccine or rubella immunization with 1 dose of MMR vaccine.  Pneumococcal 13-valent conjugate (PCV13) vaccine. When indicated, a person who is uncertain of her immunization history and has no record of immunization should receive the PCV13 vaccine. An adult aged 19 years or older who has certain medical conditions and has not been previously immunized should receive 1 dose of PCV13 vaccine. This PCV13 should be followed with a dose of pneumococcal polysaccharide (PPSV23) vaccine. The PPSV23 vaccine dose should be obtained at least 8 weeks after the dose of PCV13 vaccine. An adult aged 19 years or older who has certain medical conditions and previously received 1 or more doses of PPSV23 vaccine should receive 1 dose of PCV13. The PCV13 vaccine dose should be obtained 1 or more years after the last PPSV23 vaccine dose.  Pneumococcal polysaccharide (PPSV23) vaccine. When PCV13 is also indicated, PCV13 should be obtained first. All adults aged 65 years and older should be immunized. An adult younger than age 65 years who has certain medical conditions should be immunized. Any person who resides in a nursing home or long-term care facility should be immunized. An adult smoker should be immunized. People with an immunocompromised condition and certain other conditions should receive both PCV13 and PPSV23 vaccines. People with human immunodeficiency virus (HIV) infection should be immunized as soon as possible after diagnosis. Immunization during chemotherapy or radiation therapy should be avoided. Routine use of PPSV23 vaccine is not recommended for American Indians, [place] Natives, or people younger than 65 years unless there are medical conditions that require PPSV23 vaccine. When indicated,  people who have unknown immunization and have no record of immunization should receive PPSV23 vaccine. One-time revaccination 5 years after the first dose of PPSV23 is recommended for people aged 19 64 years who have chronic kidney failure, nephrotic syndrome, asplenia, or immunocompromised conditions. People who received 1 2 doses of PPSV23 before age 65 years should receive another dose of PPSV23 vaccine at age 65 years or later if at least 5 years have passed since the previous dose. Doses of PPSV23 are not needed for people immunized with PPSV23 at or after age 65 years.  Meningococcal vaccine. Adults with asplenia or persistent complement component deficiencies should receive 2 doses of quadrivalent meningococcal conjugate (MenACWY-D) vaccine. The doses should be obtained at least 2 months apart. Microbiologists working with certain meningococcal bacteria, military recruits, people at risk during an outbreak, and people who travel to or live in countries with a high rate of meningitis should be immunized. A first-year college student up through age 21 years who is living in a residence hall should receive a dose if she did not receive a dose on or after her 16th birthday. Adults who have certain high-risk conditions should receive one or more doses of vaccine.  Hepatitis A vaccine. Adults who wish to be protected from this disease, have certain high-risk conditions, work with hepatitis A-infected animals, work in hepatitis A research labs, or travel to or work in countries with a high rate of hepatitis A should be   immunized. Adults who were previously unvaccinated and who anticipate close contact with an international adoptee during the first 60 days after arrival in the [place] from a country with a high rate of hepatitis A should be immunized.  Hepatitis B vaccine.  Adults who wish to be protected from this disease, have certain high-risk conditions, may be exposed to blood or other infectious  body fluids, are household contacts or sex partners of hepatitis B positive people, are clients or workers in certain care facilities, or travel to or work in countries with a high rate of hepatitis B should be immunized.  Haemophilus influenzae type b (Hib) vaccine. A previously unvaccinated person with asplenia or sickle cell disease or having a scheduled splenectomy should receive 1 dose of Hib vaccine. Regardless of previous immunization, a recipient of a hematopoietic stem cell transplant should receive a 3-dose series 6 12 months after her successful transplant. Hib vaccine is not recommended for adults with HIV infection.  Preventive Services / Frequency Ages 19 to 39years  Blood pressure check.** / Every 1 to 2 years.  Lipid and cholesterol check.** / Every 5 years beginning at age 20.  Clinical breast exam.** / Every 3 years for women in their 20s and 30s.  BRCA-related cancer risk assessment.** / For women who have family members with a BRCA-related cancer (breast, ovarian, tubal, or peritoneal cancers).  Pap test.** / Every 2 years from ages 21 through 29. Every 3 years starting at age 30 through age 65 or 70 with a history of 3 consecutive normal Pap tests.  HPV screening.** / Every 3 years from ages 30 through ages 65 to 70 with a history of 3 consecutive normal Pap tests.  Hepatitis C blood test.** / For any individual with known risks for hepatitis C.  Skin self-exam. / Monthly.  Influenza vaccine. / Every year.  Tetanus, diphtheria, and acellular pertussis (Tdap, Td) vaccine.** / Consult your health care provider. Pregnant women should receive 1 dose of Tdap vaccine during each pregnancy. 1 dose of Td every 10 years.  Varicella vaccine.** / Consult your health care provider. Pregnant females who do not have evidence of immunity should receive the first dose after pregnancy.  HPV vaccine. / 3 doses over 6 months, if 26 and younger. The vaccine is not recommended for use in  pregnant females. However, pregnancy testing is not needed before receiving a dose.  Measles, mumps, rubella (MMR) vaccine.** / You need at least 1 dose of MMR if you were born in 1957 or later. You may also need a 2nd dose. For females of childbearing age, rubella immunity should be determined. If there is no evidence of immunity, females who are not pregnant should be vaccinated. If there is no evidence of immunity, females who are pregnant should delay immunization until after pregnancy.  Pneumococcal 13-valent conjugate (PCV13) vaccine.** / Consult your health care provider.  Pneumococcal polysaccharide (PPSV23) vaccine.** / 1 to 2 doses if you smoke cigarettes or if you have certain conditions.  Meningococcal vaccine.** / 1 dose if you are age 19 to 21 years and a first-year college student living in a residence hall, or have one of several medical conditions, you need to get vaccinated against meningococcal disease. You may also need additional booster doses.  Hepatitis A vaccine.** / Consult your health care provider.  Hepatitis B vaccine.** / Consult your health care provider.  Haemophilus influenzae type b (Hib) vaccine.** / Consult your health care provider.  Ages 40 to 64years    Blood pressure check.** / Every 1 to 2 years.  Lipid and cholesterol check.** / Every 5 years beginning at age 22 years.  Lung cancer screening. / Every year if you are aged 81 80 years and have a 30-pack-year history of smoking and currently smoke or have quit within the past 15 years. Yearly screening is stopped once you have quit smoking for at least 15 years or develop a health problem that would prevent you from having lung cancer treatment.  Clinical breast exam.** / Every year after age 100 years.  BRCA-related cancer risk assessment.** / For women who have family members with a BRCA-related cancer (breast, ovarian, tubal, or peritoneal cancers).  Mammogram.** / Every year beginning at age 74  years and continuing for as long as you are in good health. Consult with your health care provider.  Pap test.** / Every 3 years starting at age 28 years through age 30 or 38 years with a history of 3 consecutive normal Pap tests.  HPV screening.** / Every 3 years from ages 85 years through ages 91 to 58 years with a history of 3 consecutive normal Pap tests.  Fecal occult blood test (FOBT) of stool. / Every year beginning at age 60 years and continuing until age 55 years. You may not need to do this test if you get a colonoscopy every 10 years.  Flexible sigmoidoscopy or colonoscopy.** / Every 5 years for a flexible sigmoidoscopy or every 10 years for a colonoscopy beginning at age 3 years and continuing until age 56 years.  Hepatitis C blood test.** / For all people born from [birth year] through 1963 and any individual with known risks for hepatitis C.  Skin self-exam. / Monthly.  Influenza vaccine. / Every year.  Tetanus, diphtheria, and acellular pertussis (Tdap/Td) vaccine.** / Consult your health care provider. Pregnant women should receive 1 dose of Tdap vaccine during each pregnancy. 1 dose of Td every 10 years.  Varicella vaccine.** / Consult your health care provider. Pregnant females who do not have evidence of immunity should receive the first dose after pregnancy.  Zoster vaccine.** / 1 dose for adults aged 50 years or older.  Measles, mumps, rubella (MMR) vaccine.** / You need at least 1 dose of MMR if you were born in 1957 or later. You may also need a 2nd dose. For females of childbearing age, rubella immunity should be determined. If there is no evidence of immunity, females who are not pregnant should be vaccinated. If there is no evidence of immunity, females who are pregnant should delay immunization until after pregnancy.  Pneumococcal 13-valent conjugate (PCV13) vaccine.** / Consult your health care provider.  Pneumococcal polysaccharide (PPSV23) vaccine.** / 1 to 2 doses if  you smoke cigarettes or if you have certain conditions.  Meningococcal vaccine.** / Consult your health care provider.  Hepatitis A vaccine.** / Consult your health care provider.  Hepatitis B vaccine.** / Consult your health care provider.  Haemophilus influenzae type b (Hib) vaccine.** / Consult your health care provider.  Ages 47 years and over  Blood pressure check.** / Every 1 to 2 years.  Lipid and cholesterol check.** / Every 5 years beginning at age 55 years.  Lung cancer screening. / Every year if you are aged 57 80 years and have a 30-pack-year history of smoking and currently smoke or have quit within the past 15 years. Yearly screening is stopped once you have quit smoking for at least 15 years or develop a health problem that  would prevent you from having lung cancer treatment.  Clinical breast exam.** / Every year after age 68 years.  BRCA-related cancer risk assessment.** / For women who have family members with a BRCA-related cancer (breast, ovarian, tubal, or peritoneal cancers).  Mammogram.** / Every year beginning at age 47 years and continuing for as long as you are in good health. Consult with your health care provider.  Pap test.** / Every 3 years starting at age 59 years through age 26 or 26 years with 3 consecutive normal Pap tests. Testing can be stopped between 65 and 70 years with 3 consecutive normal Pap tests and no abnormal Pap or HPV tests in the past 10 years.  HPV screening.** / Every 3 years from ages 71 years through ages 19 or 96 years with a history of 3 consecutive normal Pap tests. Testing can be stopped between 65 and 70 years with 3 consecutive normal Pap tests and no abnormal Pap or HPV tests in the past 10 years.  Fecal occult blood test (FOBT) of stool. / Every year beginning at age 59 years and continuing until age 7 years. You may not need to do this test if you get a colonoscopy every 10 years.  Flexible sigmoidoscopy or colonoscopy.** /  Every 5 years for a flexible sigmoidoscopy or every 10 years for a colonoscopy beginning at age 28 years and continuing until age 27 years.  Hepatitis C blood test.** / For all people born from [birth year] through 1963 and any individual with known risks for hepatitis C.  Osteoporosis screening.** / A one-time screening for women ages 6 years and over and women at risk for fractures or osteoporosis.  Skin self-exam. / Monthly.  Influenza vaccine. / Every year.  Tetanus, diphtheria, and acellular pertussis (Tdap/Td) vaccine.** / 1 dose of Td every 10 years.  Varicella vaccine.** / Consult your health care provider.  Zoster vaccine.** / 1 dose for adults aged 20 years or older.  Pneumococcal 13-valent conjugate (PCV13) vaccine.** / Consult your health care provider.  Pneumococcal polysaccharide (PPSV23) vaccine.** / 1 dose for all adults aged 95 years and older.  Meningococcal vaccine.** / Consult your health care provider.  Hepatitis A vaccine.** / Consult your health care provider.  Hepatitis B vaccine.** / Consult your health care provider.  Haemophilus influenzae type b (Hib) vaccine.** / Consult your health care provider. ** Family history and personal history of risk and conditions may change your health care provider's recommendations. Document Released: 03/22/2001 Document Revised: 11/14/2012  [facility] Patient Information 2014 [place], [place].   EXERCISE AND DIET:  We recommended that you start or continue a regular exercise program for good health. Regular exercise means any activity that makes your heart beat faster and makes you sweat.  We recommend exercising at least 30 minutes per day at least 3 days a week, preferably 5.  We also recommend a diet low in fat and sugar / carbohydrates.  Inactivity, poor dietary choices and obesity can cause diabetes, heart attack, stroke, and kidney damage, among others.     ALCOHOL AND SMOKING:  Women should limit their alcohol intake to no  more than 7 drinks/beers/glasses of wine (combined, not each!) per week. Moderation of alcohol intake to this level decreases your risk of breast cancer and liver damage.  ( And of course, no recreational drugs are part of a healthy lifestyle.)  Also, you should not be smoking at all or even being exposed to second hand smoke. Most people know smoking can  cause cancer, and various heart and lung diseases, but did you know it also contributes to weakening of your bones?  Aging of your skin?  Yellowing of your teeth and nails?   CALCIUM AND VITAMIN D:  Adequate intake of calcium and Vitamin D are recommended.  The recommendations for exact amounts of these supplements seem to change often, but generally speaking 600 mg of calcium (either carbonate or citrate) and 800 units of Vitamin D per day seems prudent. Certain women may benefit from higher intake of Vitamin D.  If you are among these women, your doctor will have told you during your visit.     PAP SMEARS:  Pap smears, to check for cervical cancer or precancers,  have traditionally been done yearly, although recent scientific advances have shown that most women can have pap smears less often.  However, every woman still should have a physical exam from her gynecologist or primary care physician every year. It will include a breast check, inspection of the vulva and vagina to check for abnormal growths or skin changes, a visual exam of the cervix, and then an exam to evaluate the size and shape of the uterus and ovaries.  And after 37 years of age, a rectal exam is indicated to check for rectal cancers. We will also provide age appropriate advice regarding health maintenance, like when you should have certain vaccines, screening for sexually transmitted diseases, bone density testing, colonoscopy, mammograms, etc.    MAMMOGRAMS:  All women over 40 years old should have a yearly mammogram. Many facilities now offer a "3D" mammogram, which may cost  around $50 extra out of pocket. If possible,  we recommend you accept the option to have the 3D mammogram performed.  It both reduces the number of women who will be called back for extra views which then turn out to be normal, and it is better than the routine mammogram at detecting truly abnormal areas.     COLONOSCOPY:  Colonoscopy to screen for colon cancer is recommended for all women at age 27.  We know, you hate the idea of the prep.  We agree, BUT, having colon cancer and not knowing it is worse!!  Colon cancer so often starts as a polyp that can be seen and removed at colonscopy, which can quite literally save your life!  And if your first colonoscopy is normal and you have no family history of colon cancer, most women don't have to have it again for 10 years.  Once every ten years, you can do something that may end up saving your life, right?  We will be happy to help you get it scheduled when you are ready.  Be sure to check your insurance coverage so you understand how much it will cost.  It may be covered as a preventative service at no cost, but you should check your particular policy.

## 2019-04-17 LAB — COMPREHENSIVE METABOLIC PANEL
ALT: 16 IU/L (ref 0–32)
AST: 25 IU/L (ref 0–40)
Albumin/Globulin Ratio: 1.7 (ref 1.2–2.2)
Albumin: 4.4 g/dL (ref 3.8–4.8)
Alkaline Phosphatase: 40 IU/L (ref 39–117)
BUN/Creatinine Ratio: 18 (ref 9–23)
BUN: 14 mg/dL (ref 6–20)
Bilirubin Total: 0.2 mg/dL (ref 0.0–1.2)
CO2: 22 mmol/L (ref 20–29)
Calcium: 8.9 mg/dL (ref 8.7–10.2)
Chloride: 105 mmol/L (ref 96–106)
Creatinine, Ser: 0.76 mg/dL (ref 0.57–1.00)
GFR calc Af Amer: 117 mL/min/{1.73_m2} (ref >59)
GFR calc non Af Amer: 101 mL/min/{1.73_m2} (ref >59)
Globulin, Total: 2.6 g/dL (ref 1.5–4.5)
Glucose: 77 mg/dL (ref 65–99)
Potassium: 4.8 mmol/L (ref 3.5–5.2)
Sodium: 139 mmol/L (ref 134–144)
Total Protein: 7 g/dL (ref 6.0–8.5)

## 2019-04-17 LAB — LIPID PANEL
Chol/HDL Ratio: 1.7 ratio (ref 0.0–4.4)
Cholesterol, Total: 190 mg/dL (ref 100–199)
HDL: 109 mg/dL (ref >39)
LDL Chol Calc (NIH): 72 mg/dL (ref 0–99)
Triglycerides: 43 mg/dL (ref 0–149)
VLDL Cholesterol Cal: 9 mg/dL (ref 5–40)

## 2019-04-17 LAB — TSH: TSH: 1.13 u[IU]/mL (ref 0.450–4.500)

## 2019-04-17 LAB — CBC
Hematocrit: 30.5 % — ABNORMAL LOW (ref 34.0–46.6)
Hemoglobin: 9.3 g/dL — ABNORMAL LOW (ref 11.1–15.9)
MCH: 25.3 pg — ABNORMAL LOW (ref 26.6–33.0)
MCHC: 30.5 g/dL — ABNORMAL LOW (ref 31.5–35.7)
MCV: 83 fL (ref 79–97)
Platelets: 308 10*3/uL (ref 150–450)
RBC: 3.67 x10E6/uL — ABNORMAL LOW (ref 3.77–5.28)
RDW: 14.9 % (ref 11.7–15.4)
WBC: 8 10*3/uL (ref 3.4–10.8)

## 2019-04-17 LAB — VITAMIN D 25 HYDROXY (VIT D DEFICIENCY, FRACTURES): Vit D, 25-Hydroxy: 42.1 ng/mL (ref 30.0–100.0)

## 2019-04-17 LAB — B12 AND FOLATE PANEL
Folate: >20 ng/mL (ref >3.0)
Vitamin B-12: 1030 pg/mL (ref 232–1245)

## 2019-04-17 LAB — T4, FREE: Free T4: 1.07 ng/dL (ref 0.82–1.77)

## 2019-04-17 LAB — FERRITIN: Ferritin: 9 ng/mL — ABNORMAL LOW (ref 15–150)

## 2019-04-17 LAB — IRON AND TIBC
Iron Saturation: 4 % — CL (ref 15–55)
Iron: 16 ug/dL — ABNORMAL LOW (ref 27–159)
Total Iron Binding Capacity: 409 ug/dL (ref 250–450)
UIBC: 393 ug/dL (ref 131–425)

## 2019-04-17 LAB — RETICULOCYTES: Retic Ct Pct: 2.7 % — ABNORMAL HIGH (ref 0.6–2.6)

## 2019-04-17 LAB — TRANSFERRIN: Transferrin: 335 mg/dL (ref 192–364)

## 2019-04-22 ENCOUNTER — Telehealth: Payer: Self-pay | Admitting: Family Medicine

## 2019-04-22 ENCOUNTER — Telehealth: Payer: Self-pay | Admitting: Hematology and Oncology

## 2019-04-22 DIAGNOSIS — D509 Iron deficiency anemia, unspecified: Secondary | ICD-10-CM

## 2019-04-22 NOTE — Telephone Encounter (Signed)
Scheduled per referral. Called and spoke with pt, confirmed 4/2 appt. Pt made aware to arrive 15-30 mins early and to bring insurance card with photo ID

## 2019-04-22 NOTE — Telephone Encounter (Signed)
-----   Message from Mellody Dance, DO sent at 04/22/2019 12:59 PM EDT ----- If she now has complaints of faintness because of her anemia- that is new info to [place].   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     If she insists to get an iron infusion, she would need to be referred to hematology/oncology for eval and to proceed with that treatment option if warranted by the specialist.   There is no h/o intolerance to iron in her chart ( Gerd/ h pylori is not an issue regarding Fe absorption) and I would not be able to get her insurance to cover that cost w/o a proper indication for that procedure.    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   Also, Fe infusion requires her going to infusion clinic and thus, going to see them would be best if insists on this.  However, again, please remind pt that the txmnt is for her to have the heavy uterine bleeding corrected, rather than seeking Fe infusion, which certainly is not w/o risks in and of itself.

## 2019-05-09 DIAGNOSIS — D259 Leiomyoma of uterus, unspecified: Secondary | ICD-10-CM

## 2019-05-09 DIAGNOSIS — Z5189 Encounter for other specified aftercare: Secondary | ICD-10-CM

## 2019-05-09 HISTORY — DX: Encounter for other specified aftercare: Z51.89

## 2019-05-09 HISTORY — DX: Leiomyoma of uterus, unspecified: D25.9

## 2019-05-09 NOTE — Progress Notes (Signed)
[place] [phone]   Fax:([phone]) [place] NOTE  [name], [name] Christene Lye, MD as Consulting Physician (General Surgery) Requested, [name], [name], MD (General Surgery) Bobbye Charleston, MD as Consulting Physician (Obstetrics and Gynecology)  Hematological/Oncological History # Iron Deficiency Anemia 2/2 to GYN Bleeding 08/12/2015: WBC 7.2, Hgb 12.8, MCV 84.9, Plt 213 03/29/2018: WBC 7.4, Hgb 10.9, MCV 89, Plt 270 04/16/2019: WBC 8.0, Hgb 9.3, MCV 83, Plt 308. Iron 16, TIBC 409, ferritin 9, Iron sat 4% 05/10/2019: establish care with Dr. Lorenso Courier    CHIEF COMPLAINTS/PURPOSE OF CONSULTATION:  "Iron Deficiency Anemia "  HISTORY OF PRESENTING ILLNESS:  Marissa Salazar 39 y.o. 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  On review of the previous records Marissa Salazar saw her family medicine physician Dr. Raliegh Scarlet on 04/16/2019. At that time an ambulatory referral was made to GYN for assistance in management of her uterine fibroids and menorrhagia. At that time is was recommended that she take her PO iron with vitamin C to improve absorption. Labs were checked at that time which showed Hgb 9.3, MCV 83, and plt 308. This was a drop from the patient's prior CBC on 04/06/2018 at which time she is noted to have a hemoglobin of 10.9 and an MCV of 89.  Of note the patient's last normal hemoglobin on record is from July 2017.  Due to concern for worsening iron deficiency anemia despite p.o. iron supplementation the patient was referred to hematology for further evaluation management.  On exam today Marissa Salazar notes that she has been having issues with fatigue,  headaches, and shortness of breath due to her low hemoglobin.  The patient reports that she has been aware of her anemia for approximately 1 year and that in that time interval her fibroid pain/bleeding has been getting progressively worse.  She notes that she has ice cravings as well as some hair loss in clumps.  She reports that the p.o. iron therapy has been tolerated well and she has been taking it once daily with a source of vitamin C.  Of note she reports that she had an H. pylori infection in 2017 and was effectively treated with triple therapy.  Patient does note that her menstrual cycles are quite heavy and are not regular.  She notes that she can go through approximately 10 pads occasionally with tampons per day during the heaviest days of her.  She notes that she is currently following with OB/GYN who is ordered an ultrasound and is currently working with her in order to try and improve her GYN bleeding.  On further discussion the patient notes that she has never been treated before with IV iron or blood transfusion.  She notes that she has a distant cousin with hemophilia, but otherwise has no family history remarkable for blood or bleeding disorders.  The patient reports that she does eat a regular diet but very little in the way of red meat.  She notes that she takes a multivitamin and consumes mostly veggies.  Their protein does consist of mostly chicken and salmon.  Otherwise she denies having any fevers, chills, sweats, nausea, vomiting or diarrhea.  A full 10 point ROS is listed below.  MEDICAL HISTORY:  Past  Medical History:  Diagnosis Date  . Abnormal Pap smear    in past; normal colpo  . Allergic rhinitis   . Allergy   . Family history of breast cancer   . Family history of colon cancer   . Family history of pancreatic cancer   . Superficial vein thrombosis 2019   Left leg    SURGICAL HISTORY: Past Surgical History:  Procedure Laterality Date  . BREAST BIOPSY Left 06-12-14    fibroadenoma  . BUNIONECTOMY Right 2013  . COLPOSCOPY  2009  . ESOPHAGOGASTRODUODENOSCOPY N/A 07/17/2015   Procedure: ESOPHAGOGASTRODUODENOSCOPY (EGD);  Surgeon: Manya Silvas, MD;  Location: [facility] ENDOSCOPY;  Service: Endoscopy;  Laterality: N/A;    SOCIAL HISTORY: Social History   Socioeconomic History  . Marital status: Married    Spouse name: Not on file  . Number of children: 1  . Years of education: Not on file  . Highest education level: Not on file  Occupational History  . Occupation: Ship broker  Tobacco Use  . Smoking status: Never Smoker  . Smokeless tobacco: Never Used  Substance and Sexual Activity  . Alcohol use: Yes    Alcohol/week: 0.0 standard drinks    Comment: Rare  . Drug use: No  . Sexual activity: Yes    Birth control/protection: None  Other Topics Concern  . Not on file  Social History Narrative   Married      [name]; 27 y/o son      Ship broker; [employer] program      Regular exercise         Social Determinants of Health   Financial Resource Strain:   . Difficulty of Paying Living Expenses:   Food Insecurity:   . Worried About Charity fundraiser in the Last Year:   . Arboriculturist in the Last Year:   Transportation Needs:   . Film/video editor (Medical):   Marland Kitchen Lack of Transportation (Non-Medical):   Physical Activity:   . Days of Exercise per Week:   . Minutes of Exercise per Session:   Stress:   . Feeling of Stress :   Social Connections:   . Frequency of Communication with Friends and Family:   . Frequency of Social Gatherings with Friends and Family:   . Attends Religious Services:   . Active Member of Clubs or Organizations:   . Attends Archivist Meetings:   Marland Kitchen Marital Status:   Intimate Partner Violence:   . Fear of Current or Ex-Partner:   . Emotionally Abused:   Marland Kitchen Physically Abused:   . Sexually Abused:     FAMILY HISTORY: Family History  Problem Relation Age of Onset  . Breast cancer Maternal Aunt 60       BRCA  negative  . Colon cancer Maternal Grandmother 1  . Breast cancer Maternal Grandmother 28  . Breast cancer Other        GP  . Lung cancer Other        GP  . Diabetes Paternal Uncle   . Heart attack Maternal Uncle   . Pancreatic cancer Maternal Aunt 44    ALLERGIES:  is allergic to ivp dye [iodinated diagnostic agents].  MEDICATIONS:  Current Outpatient Medications  Medication Sig Dispense Refill  . cyclobenzaprine (FLEXERIL) 10 MG tablet Take 1 tablet (10 mg total) by mouth 3 (three) times daily as needed for muscle spasms. 30 tablet 0  . ferrous sulfate 325 (65 FE) MG EC tablet Take 1 tablet (  325 mg total) by mouth daily. 90 tablet 0  . Multiple Vitamin (MULTIVITAMIN) tablet Take 1 tablet by mouth daily.     No current facility-administered medications for this visit.    REVIEW OF SYSTEMS:   Constitutional: ( - ) fevers, ( - )  chills , ( - ) night sweats Eyes: ( - ) blurriness of vision, ( - ) double vision, ( - ) watery eyes Ears, nose, mouth, throat, and face: ( - ) mucositis, ( - ) sore throat Respiratory: ( - ) cough, ( - ) dyspnea, ( - ) wheezes Cardiovascular: ( - ) palpitation, ( - ) chest discomfort, ( - ) lower extremity swelling Gastrointestinal:  ( - ) nausea, ( - ) heartburn, ( - ) change in bowel habits Skin: ( - ) abnormal skin rashes Lymphatics: ( - ) new lymphadenopathy, ( - ) easy bruising Neurological: ( - ) numbness, ( - ) tingling, ( - ) new weaknesses Behavioral/Psych: ( - ) mood change, ( - ) new changes  All other systems were reviewed with the patient and are negative.  PHYSICAL EXAMINATION: ECOG PERFORMANCE STATUS: 1 - Symptomatic but completely ambulatory  Vitals:   05/10/19 1307  BP: 108/75  Pulse: 60  Resp: 20  Temp: 97.8 F (36.6 C)  SpO2: 100%   Filed Weights   05/10/19 1307  Weight: 128 lb 8 oz (58.3 kg)    GENERAL: well appearing young Caucasian female in NAD  SKIN: skin color, texture, turgor are normal, no rashes or  significant lesions EYES: conjunctiva are pink and non-injected, sclera clear LUNGS: clear to auscultation and percussion with normal breathing effort HEART: regular rate & rhythm and no murmurs and no lower extremity edema Musculoskeletal: no cyanosis of digits and no clubbing  PSYCH: alert & oriented x 3, fluent speech NEURO: no focal motor/sensory deficits  LABORATORY DATA:  I have reviewed the data as listed CBC Latest Ref Rng & Units 05/10/2019 04/16/2019 03/29/2018  WBC 4.0 - 10.5 K/uL 8.7 8.0 7.4  Hemoglobin 12.0 - 15.0 g/dL 10.7(L) 9.3(L) 10.9(L)  Hematocrit 36.0 - 46.0 % 36.4 30.5(L) 34.3  Platelets 150 - 400 K/uL 359 308 270    CMP Latest Ref Rng & Units 05/10/2019 04/16/2019 03/29/2018  Glucose 70 - 99 mg/dL 88 77 78  BUN 6 - 20 mg/dL 9 14 13   Creatinine 0.44 - 1.00 mg/dL 0.75 0.76 0.82  Sodium 135 - 145 mmol/L 141 139 139  Potassium 3.5 - 5.1 mmol/L 3.6 4.8 4.4  Chloride 98 - 111 mmol/L 104 105 105  CO2 22 - 32 mmol/L 26 22 22   Calcium 8.9 - 10.3 mg/dL 9.4 8.9 8.8  Total Protein 6.5 - 8.1 g/dL 7.7 7.0 6.5  Total Bilirubin 0.3 - 1.2 mg/dL 0.4 0.2 0.3  Alkaline Phos 38 - 126 U/L 41 40 40  AST 15 - 41 U/L 34 25 35  ALT 0 - 44 U/L 32 16 30    PATHOLOGY: None relevant to review.   RADIOGRAPHIC STUDIES: None relevant to review.  No results found.  ASSESSMENT & PLAN Marissa Salazar 39 y.o. 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  After review of the labs and discussion with the patient her findings are most consistent with iron deficiency anemia secondary to heavy GYN bleeding.  Fortunately  the patient is currently under evaluation by OB/GYN with an upcoming ultrasound to further assess.  She notes that she does have a history of fibroids and that they are  currently exploring options for how to best control this.  In terms of treatment of her iron deficiency anemia it is clear at this time that her p.o. iron supplementation has not been adequate to replete her iron stores.  As such I would recommend we proceed forward with IV Feraheme 510 mg q. 7 days x 2 doses to start as early as next week.  I would encourage the patient to continue taking her p.o. iron in the interim.  We will plan to see her back in approximately 6 weeks after her last dose of iron to assure that her hemoglobin has responded.  In the event that her hemoglobin does not respond to this IV iron supplementation I would recommend that we consider further evaluation with additional nutritional studies or potentially a bone marrow biopsy.  However at this time I think it is clear that iron deficiency anemia is the most likely cause for her current findings.  # Iron Deficiency Anemia 2/2 to GYN Bleeding --today will order CBC, CMP, iron panel and ferritin --patient has been taking PO iron supplementation since at least 02/27/2018. Given her continued drop in Hgb it is clear that PO supplementation is inadequate to replete her ongoing iron loss --recommend the patient receive IV feraheme 579m q7 days x 2 doses to start as early as next week.  --strongly encourage patient to continue with further GYN evaluation.  --recommend the patient return to clinic 6 weeks after her last dose of IV iron to assure improvement in Hgb   #History of Superficial Thrombophlebitis --no current issues, occurred previously in 2019 --continue to monitor   Orders Placed This Encounter  Procedures  . CBC with Differential (Cancer Center Only)    Standing Status:   Future    Number of Occurrences:   1    Standing Expiration Date:   05/09/2020  . Retic Panel    Standing Status:   Future    Number of Occurrences:   1    Standing Expiration Date:   05/09/2020  . Save Smear (SSMR)    Standing Status:   Future     Number of Occurrences:   1    Standing Expiration Date:   05/09/2020  . CMP ([facility])    Standing Status:   Future    Number of Occurrences:   1    Standing Expiration Date:   05/09/2020  . Lactate dehydrogenase (LDH)    Standing Status:   Future    Number of Occurrences:   1    Standing Expiration Date:   05/09/2020  . Iron and TIBC    Standing Status:   Future    Number of Occurrences:   1    Standing Expiration Date:   05/09/2020  . Ferritin    Standing Status:   Future    Number of Occurrences:   1    Standing Expiration Date:   05/09/2020  . Copper, serum    Standing Status:   Future    Number of Occurrences:   1    Standing Expiration Date:   05/09/2020  . Vitamin B12    Standing Status:   Future    Number of Occurrences:   1    Standing Expiration Date:   05/09/2020  . Folate, Serum  Standing Status:   Future    Number of Occurrences:   1    Standing Expiration Date:   05/09/2020  . Methylmalonic acid, serum    Standing Status:   Future    Number of Occurrences:   1    Standing Expiration Date:   05/09/2020  . Homocysteine, serum    Standing Status:   Future    Number of Occurrences:   1    Standing Expiration Date:   05/09/2020   All questions were answered. The patient knows to call the clinic with any problems, questions or concerns.  A total of more than 60 minutes were spent on this encounter and over half of that time was spent on counseling and coordination of care as outlined above.   Ledell Peoples, MD Department of Hematology/Oncology [place] at [facility] Phone: [phone] Pager: [phone] Email: [email or web address]  05/10/2019 5:44 PM

## 2019-05-10 ENCOUNTER — Other Ambulatory Visit: Payer: Self-pay

## 2019-05-10 ENCOUNTER — Inpatient Hospital Stay (HOSPITAL_BASED_OUTPATIENT_CLINIC_OR_DEPARTMENT_OTHER): Payer: No Typology Code available for payment source | Admitting: Hematology and Oncology

## 2019-05-10 ENCOUNTER — Inpatient Hospital Stay: Payer: No Typology Code available for payment source | Attending: Hematology and Oncology

## 2019-05-10 ENCOUNTER — Encounter: Payer: Self-pay | Admitting: Hematology and Oncology

## 2019-05-10 VITALS — BP 108/75 | HR 60 | Temp 97.8°F | Resp 20 | Ht 64.0 in | Wt 128.5 lb

## 2019-05-10 DIAGNOSIS — Z833 Family history of diabetes mellitus: Secondary | ICD-10-CM | POA: Diagnosis not present

## 2019-05-10 DIAGNOSIS — D259 Leiomyoma of uterus, unspecified: Secondary | ICD-10-CM | POA: Insufficient documentation

## 2019-05-10 DIAGNOSIS — Z79899 Other long term (current) drug therapy: Secondary | ICD-10-CM

## 2019-05-10 DIAGNOSIS — Z801 Family history of malignant neoplasm of trachea, bronchus and lung: Secondary | ICD-10-CM | POA: Insufficient documentation

## 2019-05-10 DIAGNOSIS — Z800 Family history of malignant neoplasm of digestive organs: Secondary | ICD-10-CM | POA: Insufficient documentation

## 2019-05-10 DIAGNOSIS — D500 Iron deficiency anemia secondary to blood loss (chronic): Secondary | ICD-10-CM | POA: Diagnosis not present

## 2019-05-10 DIAGNOSIS — Z803 Family history of malignant neoplasm of breast: Secondary | ICD-10-CM | POA: Insufficient documentation

## 2019-05-10 DIAGNOSIS — N920 Excessive and frequent menstruation with regular cycle: Secondary | ICD-10-CM | POA: Diagnosis not present

## 2019-05-10 DIAGNOSIS — Z86718 Personal history of other venous thrombosis and embolism: Secondary | ICD-10-CM

## 2019-05-10 DIAGNOSIS — Z8249 Family history of ischemic heart disease and other diseases of the circulatory system: Secondary | ICD-10-CM | POA: Diagnosis not present

## 2019-05-10 LAB — CMP (CANCER CENTER ONLY)
ALT: 32 U/L (ref 0–44)
AST: 34 U/L (ref 15–41)
Albumin: 4.2 g/dL (ref 3.5–5.0)
Alkaline Phosphatase: 41 U/L (ref 38–126)
Anion gap: 11 (ref 5–15)
BUN: 9 mg/dL (ref 6–20)
CO2: 26 mmol/L (ref 22–32)
Calcium: 9.4 mg/dL (ref 8.9–10.3)
Chloride: 104 mmol/L (ref 98–111)
Creatinine: 0.75 mg/dL (ref 0.44–1.00)
GFR, Est AFR Am: >60 mL/min (ref >60)
GFR, Estimated: >60 mL/min (ref >60)
Glucose, Bld: 88 mg/dL (ref 70–99)
Potassium: 3.6 mmol/L (ref 3.5–5.1)
Sodium: 141 mmol/L (ref 135–145)
Total Bilirubin: 0.4 mg/dL (ref 0.3–1.2)
Total Protein: 7.7 g/dL (ref 6.5–8.1)

## 2019-05-10 LAB — CBC WITH DIFFERENTIAL (CANCER CENTER ONLY)
Abs Immature Granulocytes: 0.04 10*3/uL (ref 0.00–0.07)
Basophils Absolute: 0.1 10*3/uL (ref 0.0–0.1)
Basophils Relative: 1 %
Eosinophils Absolute: 0.1 10*3/uL (ref 0.0–0.5)
Eosinophils Relative: 1 %
HCT: 36.4 % (ref 36.0–46.0)
Hemoglobin: 10.7 g/dL — ABNORMAL LOW (ref 12.0–15.0)
Immature Granulocytes: 1 %
Lymphocytes Relative: 17 %
Lymphs Abs: 1.5 10*3/uL (ref 0.7–4.0)
MCH: 26.7 pg (ref 26.0–34.0)
MCHC: 29.4 g/dL — ABNORMAL LOW (ref 30.0–36.0)
MCV: 90.8 fL (ref 80.0–100.0)
Monocytes Absolute: 0.5 10*3/uL (ref 0.1–1.0)
Monocytes Relative: 6 %
Neutro Abs: 6.5 10*3/uL (ref 1.7–7.7)
Neutrophils Relative %: 74 %
Platelet Count: 359 10*3/uL (ref 150–400)
RBC: 4.01 MIL/uL (ref 3.87–5.11)
RDW: 20.3 % — ABNORMAL HIGH (ref 11.5–15.5)
WBC Count: 8.7 10*3/uL (ref 4.0–10.5)
nRBC: 0 % (ref 0.0–0.2)

## 2019-05-10 LAB — RETIC PANEL
Immature Retic Fract: 15.4 % (ref 2.3–15.9)
RBC.: 4.04 MIL/uL (ref 3.87–5.11)
Retic Count, Absolute: 143.8 10*3/uL (ref 19.0–186.0)
Retic Ct Pct: 3.6 % — ABNORMAL HIGH (ref 0.4–3.1)
Reticulocyte Hemoglobin: 33.7 pg (ref >27.9)

## 2019-05-10 LAB — LACTATE DEHYDROGENASE: LDH: 163 U/L (ref 98–192)

## 2019-05-10 LAB — SAVE SMEAR(SSMR), FOR PROVIDER SLIDE REVIEW

## 2019-05-10 LAB — FOLATE: Folate: 48.3 ng/mL (ref >5.9)

## 2019-05-10 LAB — VITAMIN B12: Vitamin B-12: 1067 pg/mL — ABNORMAL HIGH (ref 180–914)

## 2019-05-11 LAB — HOMOCYSTEINE: Homocysteine: 5.5 umol/L (ref 0.0–14.5)

## 2019-05-13 LAB — IRON AND TIBC
Iron: 410 ug/dL — ABNORMAL HIGH (ref 41–142)
Saturation Ratios: 103 % — ABNORMAL HIGH (ref 21–57)
TIBC: 396 ug/dL (ref 236–444)
UIBC: UNDETERMINED ug/dL (ref 120–384)

## 2019-05-13 LAB — COPPER, SERUM: Copper: 131 ug/dL (ref 80–158)

## 2019-05-13 LAB — FERRITIN: Ferritin: 67 ng/mL (ref 11–307)

## 2019-05-15 ENCOUNTER — Telehealth: Payer: Self-pay | Admitting: Hematology and Oncology

## 2019-05-15 NOTE — Telephone Encounter (Signed)
Scheduled per los. Called and left msg. Mailed printout  °

## 2019-05-18 LAB — METHYLMALONIC ACID, SERUM: Methylmalonic Acid, Quantitative: 116 nmol/L (ref 0–378)

## 2019-05-20 ENCOUNTER — Ambulatory Visit: Payer: No Typology Code available for payment source

## 2019-05-20 ENCOUNTER — Inpatient Hospital Stay: Payer: No Typology Code available for payment source

## 2019-05-20 ENCOUNTER — Other Ambulatory Visit: Payer: Self-pay

## 2019-05-20 VITALS — BP 120/64 | HR 65 | Temp 98.6°F | Resp 18

## 2019-05-20 DIAGNOSIS — D500 Iron deficiency anemia secondary to blood loss (chronic): Secondary | ICD-10-CM

## 2019-05-20 LAB — CBC WITH DIFFERENTIAL/PLATELET
Abs Immature Granulocytes: 0.01 10*3/uL (ref 0.00–0.07)
Basophils Absolute: 0 10*3/uL (ref 0.0–0.1)
Basophils Relative: 1 %
Eosinophils Absolute: 0.1 10*3/uL (ref 0.0–0.5)
Eosinophils Relative: 1 %
HCT: 34 % — ABNORMAL LOW (ref 36.0–46.0)
Hemoglobin: 10.6 g/dL — ABNORMAL LOW (ref 12.0–15.0)
Immature Granulocytes: 0 %
Lymphocytes Relative: 30 %
Lymphs Abs: 1.7 10*3/uL (ref 0.7–4.0)
MCH: 28 pg (ref 26.0–34.0)
MCHC: 31.2 g/dL (ref 30.0–36.0)
MCV: 89.7 fL (ref 80.0–100.0)
Monocytes Absolute: 0.3 10*3/uL (ref 0.1–1.0)
Monocytes Relative: 6 %
Neutro Abs: 3.7 10*3/uL (ref 1.7–7.7)
Neutrophils Relative %: 62 %
Platelets: 245 10*3/uL (ref 150–400)
RBC: 3.79 MIL/uL — ABNORMAL LOW (ref 3.87–5.11)
RDW: 18.3 % — ABNORMAL HIGH (ref 11.5–15.5)
WBC: 5.9 10*3/uL (ref 4.0–10.5)
nRBC: 0 % (ref 0.0–0.2)

## 2019-05-20 MED ORDER — ACETAMINOPHEN 325 MG PO TABS
ORAL_TABLET | ORAL | Status: AC
  Filled 2019-05-20: qty 2

## 2019-05-20 MED ORDER — SODIUM CHLORIDE 0.9 % IV SOLN
Freq: Once | INTRAVENOUS | Status: AC
  Filled 2019-05-20: qty 250

## 2019-05-20 MED ORDER — SODIUM CHLORIDE 0.9 % IV SOLN
510.0000 mg | Freq: Once | INTRAVENOUS | Status: AC
  Administered 2019-05-20: 510 mg via INTRAVENOUS
  Filled 2019-05-20: qty 510

## 2019-05-20 MED ORDER — DIPHENHYDRAMINE HCL 50 MG/ML IJ SOLN: 50.0000 mg | Freq: Once | INTRAMUSCULAR | Status: DC

## 2019-05-20 MED ORDER — ACETAMINOPHEN 325 MG PO TABS
650.0000 mg | ORAL_TABLET | Freq: Once | ORAL | Status: AC
  Administered 2019-05-20: 16:00:00 650 mg via ORAL

## 2019-05-20 NOTE — Patient Instructions (Signed)

## 2019-05-20 NOTE — Progress Notes (Signed)
Pt concerned about benadryl dose, she drove herself here today, ok to hold benadryl today.

## 2019-05-20 NOTE — Progress Notes (Signed)
Spoke w/ Dr. Lorenso Courier - TSAT elevated on last lab. He suspects it could be a lab error. Patient to get repeat iron panel today. She will receive feraheme today and depending on the results of this repeat panel, may or may not get next week's feraheme infusion.   Demetrius Charity, PharmD, BCPS, [facility] Phone: [phone] 05/20/2019

## 2019-05-21 ENCOUNTER — Ambulatory Visit: Payer: No Typology Code available for payment source

## 2019-05-21 LAB — IRON AND TIBC
Iron: 24 ug/dL — ABNORMAL LOW (ref 41–142)
Saturation Ratios: 7 % — ABNORMAL LOW (ref 21–57)
TIBC: 350 ug/dL (ref 236–444)
UIBC: 326 ug/dL (ref 120–384)

## 2019-05-21 LAB — FERRITIN: Ferritin: 35 ng/mL (ref 11–307)

## 2019-05-22 ENCOUNTER — Encounter: Payer: Self-pay | Admitting: Hematology and Oncology

## 2019-05-24 ENCOUNTER — Other Ambulatory Visit: Payer: Self-pay | Admitting: *Deleted

## 2019-05-24 MED ORDER — FERROUS SULFATE 325 (65 FE) MG PO TABS: 325.0000 mg | ORAL_TABLET | Freq: Every day | ORAL | 3 refills | Status: DC

## 2019-05-27 ENCOUNTER — Other Ambulatory Visit: Payer: Self-pay

## 2019-05-27 ENCOUNTER — Inpatient Hospital Stay: Payer: No Typology Code available for payment source

## 2019-05-27 ENCOUNTER — Ambulatory Visit: Payer: No Typology Code available for payment source

## 2019-05-27 VITALS — BP 100/60 | HR 75 | Temp 98.0°F | Resp 18

## 2019-05-27 DIAGNOSIS — D500 Iron deficiency anemia secondary to blood loss (chronic): Secondary | ICD-10-CM

## 2019-05-27 MED ORDER — SODIUM CHLORIDE 0.9 % IV SOLN
510.0000 mg | Freq: Once | INTRAVENOUS | Status: AC
  Administered 2019-05-27: 510 mg via INTRAVENOUS
  Filled 2019-05-27: qty 17

## 2019-05-27 MED ORDER — DIPHENHYDRAMINE HCL 50 MG/ML IJ SOLN: 50.0000 mg | Freq: Once | INTRAMUSCULAR | Status: DC

## 2019-05-27 MED ORDER — ACETAMINOPHEN 325 MG PO TABS: 650.0000 mg | ORAL_TABLET | Freq: Once | ORAL | Status: DC

## 2019-05-27 MED ORDER — SODIUM CHLORIDE 0.9 % IV SOLN
Freq: Once | INTRAVENOUS | Status: AC
  Filled 2019-05-27: qty 250

## 2019-05-27 NOTE — Progress Notes (Signed)
Patient opted to only stay 15 minutes post observation. Discharged in stable condition.

## 2019-05-27 NOTE — Patient Instructions (Signed)

## 2019-05-28 ENCOUNTER — Ambulatory Visit: Payer: No Typology Code available for payment source

## 2019-07-10 ENCOUNTER — Other Ambulatory Visit: Payer: No Typology Code available for payment source

## 2019-07-10 ENCOUNTER — Ambulatory Visit: Payer: No Typology Code available for payment source | Admitting: Hematology and Oncology

## 2019-07-12 ENCOUNTER — Inpatient Hospital Stay: Payer: No Typology Code available for payment source | Admitting: Hematology and Oncology

## 2019-07-12 ENCOUNTER — Inpatient Hospital Stay: Payer: No Typology Code available for payment source | Attending: Hematology and Oncology

## 2019-07-12 ENCOUNTER — Other Ambulatory Visit: Payer: Self-pay | Admitting: Hematology and Oncology

## 2019-07-12 DIAGNOSIS — D500 Iron deficiency anemia secondary to blood loss (chronic): Secondary | ICD-10-CM

## 2019-08-22 ENCOUNTER — Inpatient Hospital Stay: Admit: 2019-08-22 | Payer: No Typology Code available for payment source | Admitting: Obstetrics and Gynecology

## 2019-08-22 SURGERY — MYOMECTOMY, ABDOMINAL APPROACH
Anesthesia: Choice

## 2019-09-23 ENCOUNTER — Encounter: Payer: Self-pay | Admitting: Obstetrics and Gynecology

## 2019-09-23 ENCOUNTER — Ambulatory Visit (INDEPENDENT_AMBULATORY_CARE_PROVIDER_SITE_OTHER): Payer: No Typology Code available for payment source | Admitting: Obstetrics and Gynecology

## 2019-09-23 ENCOUNTER — Other Ambulatory Visit: Payer: Self-pay

## 2019-09-23 VITALS — BP 100/62 | HR 70 | Resp 16 | Ht 64.0 in | Wt 129.0 lb

## 2019-09-23 DIAGNOSIS — D219 Benign neoplasm of connective and other soft tissue, unspecified: Secondary | ICD-10-CM

## 2019-09-23 DIAGNOSIS — N921 Excessive and frequent menstruation with irregular cycle: Secondary | ICD-10-CM

## 2019-09-23 NOTE — Progress Notes (Signed)
GYNECOLOGY  VISIT   HPI: 39 y.o.   Married  Caucasian  female   G2P2002 with Patient's last menstrual period was 09/12/2019 (exact date).   here for heavy irregular menses for 1 - 2 years. Patient with history of fibroids, anemia with iron transfusions at the cancer center.  States a large posterior 4 cm fibroid.  [place] at [facility] in spring 2021.   Menses are irregular, every 2 weeks.  They last 7 - 12 days, and has clotting.  Pad change every 30 - 60 minutes.  Staining through sheets.   Pain with cycles has increased.   Insomnia and swelling during her menstrual cycle.   2 iron infusions in April.  Hgb 9.3 04/16/19. Hgb 10.6 on 05/20/19.  Denies SOB and palpitations.  Lacking energy.   Lysteda has not worked, and she is not taking it.   She had a superficial venous thrombosis while taking combined oral contraception.  She tried POP, and this did not work.   Had GYN exam in July, 2020 with Dr. Philis Pique.  Saw Dr. [name] this spring.    Patient is looking for nonsurgical options.  No certain if she would like future childbearing.   Works in urology office.  GYNECOLOGIC HISTORY: Patient's last menstrual period was 09/12/2019 (exact date). Contraception: Abstinence, separated.  Menopausal hormone therapy:  n/a Last mammogram: 11-26-19Lt.Br.US--See Epic Last pap smear: 08/2018 normal per patient---hx of abnormal pap 2009 with neg. colpo--paps normal since.        OB History    Gravida  2   Para  2   Term  2   Preterm  0   AB  0   Living  2     SAB  0   TAB  0   Ectopic  0   Multiple  0   Live Births  1        Obstetric Comments  1st Menstrual Cycle: 13  1st Pregnancy:  24            Patient Active Problem List   Diagnosis Date Noted  . Uterine leiomyoma 04/16/2019  . 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 Family history of breast cancer   . Family history of colon cancer   . Family history of pancreatic cancer   .  Iron deficiency anemia 03/29/2018  . Elevated pulse rate 03/29/2018  . Excessive menstruation at puberty 03/29/2018  . History of uterine fibroid 03/29/2018  . GERD (gastroesophageal reflux disease) 05/23/2017  . History of Helicobacter pylori infection 05/23/2017  . Nevus 09/29/2016  . Family history of breast cancer in female 05/28/2014    Past Medical History:  Diagnosis Date  . Abnormal Pap smear    in past; normal colpo  . Allergic rhinitis   . Allergy   . Anemia    Hx fibroids  . Blood transfusion without reported diagnosis 05/2019  . Family history of breast cancer   . Family history of colon cancer   . Family history of pancreatic cancer   . Fibroid   . Superficial vein thrombosis 2019   Left leg    Past Surgical History:  Procedure Laterality Date  . BREAST BIOPSY Left 06-12-14   fibroadenoma  . BUNIONECTOMY Right 2013  . COLPOSCOPY  2009  . ESOPHAGOGASTRODUODENOSCOPY N/A 07/17/2015   Procedure: ESOPHAGOGASTRODUODENOSCOPY (EGD);  Surgeon: Manya Silvas, MD;  Location: [facility] ENDOSCOPY;  Service: Endoscopy;  Laterality: N/A;    Current Outpatient Medications  Medication Sig Dispense Refill  . ferrous sulfate 325 (65 FE) MG tablet Take 325 mg by mouth daily with breakfast.    . Multiple Vitamin (MULTIVITAMIN) tablet Take 1 tablet by mouth daily.    . tranexamic acid (LYSTEDA) 650 MG TABS tablet tranexamic acid 650 mg tablet  TAKE 2 TABLETS 3 TIMES A DAY BY ORAL ROUTE AS NEEDED. (Patient not taking: Reported on 09/23/2019)     No current facility-administered medications for this visit.     ALLERGIES: Ivp dye [iodinated diagnostic agents]  Family History  Problem Relation Age of Onset  . Breast cancer Maternal Aunt 60       BRCA negative  . Colon cancer Maternal Grandmother 59  . Breast cancer Maternal Grandmother 67  . Hypertension Maternal Grandmother   . Breast cancer Other        GP  . Lung cancer Other        GP  . Diabetes Paternal Uncle   . Heart  attack Maternal Uncle   . Pancreatic cancer Maternal Aunt 44    Social History   Socioeconomic History  . Marital status: Married    Spouse name: Not on file  . Number of children: 1  . Years of education: Not on file  . Highest education level: Not on file  Occupational History  . Occupation: Ship broker  Tobacco Use  . Smoking status: Never Smoker  . Smokeless tobacco: Never Used  Vaping Use  . Vaping Use: Never used  Substance and Sexual Activity  . Alcohol use: Yes    Alcohol/week: 0.0 standard drinks    Comment: Rare  . Drug use: No  . Sexual activity: Not Currently    Birth control/protection: None  Other Topics Concern  . Not on file  Social History Narrative   Married      [name]; 75 y/o son      Ship broker; CMA program      Regular exercise         Social Determinants of Health   Financial Resource Strain:   . Difficulty of Paying Living Expenses:   Food Insecurity:   . Worried About Charity fundraiser in the Last Year:   . Arboriculturist in the Last Year:   Transportation Needs:   . Film/video editor (Medical):   Marland Kitchen Lack of Transportation (Non-Medical):   Physical Activity:   . Days of Exercise per Week:   . Minutes of Exercise per Session:   Stress:   . Feeling of Stress :   Social Connections:   . Frequency of Communication with Friends and Family:   . Frequency of Social Gatherings with Friends and Family:   . Attends Religious Services:   . Active Member of Clubs or Organizations:   . Attends Archivist Meetings:   Marland Kitchen Marital Status:   Intimate Partner Violence:   . Fear of Current or Ex-Partner:   . Emotionally Abused:   Marland Kitchen Physically Abused:   . Sexually Abused:     Review of Systems  All other systems reviewed and are negative.   PHYSICAL EXAMINATION:    BP 100/62   Pulse 70   Resp 16   Ht 5' 4"  (1.626 m)   Wt 129 lb (58.5 kg)   LMP 09/12/2019 (Exact Date)   BMI 22.14 kg/m     General appearance: alert, cooperative  and appears stated age Head: Normocephalic, without obvious abnormality, atraumatic Neck: no adenopathy, supple, symmetrical, trachea  midline and thyroid normal to inspection and palpation Lungs: clear to auscultation bilaterally Heart: regular rate and rhythm Abdomen: soft, non-tender, no masses,  no organomegaly Extremities: extremities normal, atraumatic, no cyanosis or edema Skin: Skin color, texture, turgor normal. No rashes or lesions Lymph nodes: Cervical, supraclavicular, and axillary nodes normal. No abnormal inguinal nodes palpated Neurologic: Grossly normal  Pelvic: External genitalia:  no lesions              Urethra:  normal appearing urethra with no masses, tenderness or lesions              Bartholins and Skenes: normal                 Vagina: normal appearing vagina with normal color and discharge, no lesions              Cervix: no lesions                Bimanual Exam:  Uterus:  10 week size.               Adnexa: no mass, fullness, tenderness              Rectal exam: Yes.  .  Confirms.              Anus:  normal sphincter tone, no lesions  Chaperone was present for exam.  ASSESSMENT  Menorrhagia with irregular menses.  Fibroids.  Hx superficial thrombus of left leg.  Off Lysteda. ATM gene mutation.  FH of cancers, including breast cancer.   PLAN  We discussed fibroids.  Check CBC, iron, ferritin.  We discussed Depo Provera, Elagolix, Mirena IUD?, uterine artery embolization.  Return for pelvic US and EMB.  Will try to get records from [facility].  ACOG HO on fibroids.  Questions invited and answered.

## 2019-09-24 ENCOUNTER — Telehealth: Payer: Self-pay | Admitting: Obstetrics and Gynecology

## 2019-09-24 LAB — FERRITIN: Ferritin: 37 ng/mL (ref 15–150)

## 2019-09-24 LAB — CBC
Hematocrit: 32.2 % — ABNORMAL LOW (ref 34.0–46.6)
Hemoglobin: 9.7 g/dL — ABNORMAL LOW (ref 11.1–15.9)
MCH: 29 pg (ref 26.6–33.0)
MCHC: 30.1 g/dL — ABNORMAL LOW (ref 31.5–35.7)
MCV: 96 fL (ref 79–97)
Platelets: 324 10*3/uL (ref 150–450)
RBC: 3.34 x10E6/uL — ABNORMAL LOW (ref 3.77–5.28)
RDW: 13.1 % (ref 11.7–15.4)
WBC: 8.1 10*3/uL (ref 3.4–10.8)

## 2019-09-24 LAB — IRON: Iron: 92 ug/dL (ref 27–159)

## 2019-09-24 NOTE — Telephone Encounter (Signed)
Call to patient. Per DPR, OK to leave message on voicemail.   Left voicemail requesting a return call to [facility] to review benefits and schedule recommended Pelvic ultrasound and EMB with Brook A. Quincy Simmonds, MD, Cherlynn June

## 2019-09-24 NOTE — Telephone Encounter (Signed)
Patient returned call

## 2019-09-25 ENCOUNTER — Telehealth: Payer: Self-pay | Admitting: *Deleted

## 2019-09-25 NOTE — Telephone Encounter (Signed)
Left message to return call to Hayley at [phone] 

## 2019-09-25 NOTE — Telephone Encounter (Signed)
Burnice Logan, RN  09/25/2019 9:24 AM EDT Back to Top    Left message to call Sharee Pimple, RN at [facility].       Also see open telephone encounter dated 09/24/19 regarding PUS and EMB

## 2019-09-25 NOTE — Telephone Encounter (Signed)
Spoke with patient, advised as seen below per Dr. Quincy Simmonds.  Patient is scheduled to see hematology on 10/04/19.  Patient verbalizes understanding and is agreeable.   Call transferred to [facility] to review benefits for PUS and EMB.   Encounter closed.

## 2019-09-25 NOTE — Telephone Encounter (Signed)
Spoke with patient.  Abstinence for contraception.  LMP 09/12/19 PUS and EMB scheduled for 10/03/19 at 11am, consult to follow with Dr. Quincy Simmonds.  Cancellation policy reviewed.   Patient asking if myomectomy is a treatment option for fibroids?  Yes, myomectomy removes fibroids while preserving the uterus. Per review of 09/23/19 OV notes patient is not certain if she would like future childbearing, advised may need referral if still considering pregnancy. Recommended patient proceed with PUS and EMB for further evaluation, can further discuss treatment options and recommendations day of PUS. Patient agreeable to plan.   Routing to Dr. Antony Blackbird.   Encounter closed.

## 2019-09-25 NOTE — Telephone Encounter (Signed)
-----   Message from Nunzio Cobbs, MD sent at 09/24/2019  5:25 PM EDT ----- Please contact patient with results of her testing.  Her hemoglobin is low at 9.7, but her iron and ferritin are normal.   I recommend she return to her hematologist/oncologist for further evaluation of her anemia.   We will continue her evaluation for her abnormal bleeding and fibroids.

## 2019-09-25 NOTE — Telephone Encounter (Signed)
Spoke with patient regarding benefits for recommended Pelvic Ultrasound and Endometrial Biopsy. Patient acknowledges understanding of information presented. Patient has additional questions regarding her treatment options. Call transferred to Glorianne Manchester, RN, to discuss options with patient.

## 2019-09-28 ENCOUNTER — Other Ambulatory Visit: Payer: Self-pay | Admitting: Hematology and Oncology

## 2019-10-02 ENCOUNTER — Telehealth: Payer: Self-pay | Admitting: Obstetrics and Gynecology

## 2019-10-02 NOTE — Telephone Encounter (Signed)
Patient cancelled ultrasound Thursday because she is waiting on covid results. Would like a call to reschedule.

## 2019-10-03 ENCOUNTER — Other Ambulatory Visit: Payer: No Typology Code available for payment source

## 2019-10-03 ENCOUNTER — Other Ambulatory Visit: Payer: Self-pay | Admitting: Obstetrics and Gynecology

## 2019-10-03 NOTE — Telephone Encounter (Signed)
Left message to call Sharee Pimple, RN at [facility].    PUS and EMB for menorrhagia w/ irregular menses

## 2019-10-04 ENCOUNTER — Inpatient Hospital Stay: Payer: No Typology Code available for payment source

## 2019-10-04 ENCOUNTER — Inpatient Hospital Stay: Payer: No Typology Code available for payment source | Admitting: Hematology and Oncology

## 2019-10-05 ENCOUNTER — Other Ambulatory Visit: Payer: Self-pay

## 2019-10-05 ENCOUNTER — Emergency Department (HOSPITAL_COMMUNITY): Payer: No Typology Code available for payment source

## 2019-10-05 ENCOUNTER — Encounter (HOSPITAL_COMMUNITY): Payer: Self-pay | Admitting: Emergency Medicine

## 2019-10-05 ENCOUNTER — Inpatient Hospital Stay (HOSPITAL_COMMUNITY)
Admission: EM | Admit: 2019-10-05 | Discharge: 2019-10-09 | DRG: 811 | Disposition: A | Discharge status: Home / Self Care | Payer: No Typology Code available for payment source | Attending: Internal Medicine | Admitting: Internal Medicine

## 2019-10-05 DIAGNOSIS — Z833 Family history of diabetes mellitus: Secondary | ICD-10-CM

## 2019-10-05 DIAGNOSIS — D259 Leiomyoma of uterus, unspecified: Secondary | ICD-10-CM | POA: Diagnosis present

## 2019-10-05 DIAGNOSIS — D62 Acute posthemorrhagic anemia: Principal | ICD-10-CM | POA: Diagnosis present

## 2019-10-05 DIAGNOSIS — Z803 Family history of malignant neoplasm of breast: Secondary | ICD-10-CM

## 2019-10-05 DIAGNOSIS — D500 Iron deficiency anemia secondary to blood loss (chronic): Secondary | ICD-10-CM | POA: Diagnosis not present

## 2019-10-05 DIAGNOSIS — D649 Anemia, unspecified: Secondary | ICD-10-CM | POA: Diagnosis not present

## 2019-10-05 DIAGNOSIS — U071 COVID-19: Secondary | ICD-10-CM

## 2019-10-05 DIAGNOSIS — Z800 Family history of malignant neoplasm of digestive organs: Secondary | ICD-10-CM

## 2019-10-05 DIAGNOSIS — D509 Iron deficiency anemia, unspecified: Secondary | ICD-10-CM | POA: Diagnosis present

## 2019-10-05 DIAGNOSIS — Z8249 Family history of ischemic heart disease and other diseases of the circulatory system: Secondary | ICD-10-CM

## 2019-10-05 DIAGNOSIS — N920 Excessive and frequent menstruation with regular cycle: Secondary | ICD-10-CM | POA: Diagnosis present

## 2019-10-05 DIAGNOSIS — R05 Cough: Secondary | ICD-10-CM | POA: Diagnosis not present

## 2019-10-05 DIAGNOSIS — Z86018 Personal history of other benign neoplasm: Secondary | ICD-10-CM

## 2019-10-05 HISTORY — DX: COVID-19: U07.1

## 2019-10-05 LAB — COMPREHENSIVE METABOLIC PANEL
ALT: 24 U/L (ref 0–44)
AST: 30 U/L (ref 15–41)
Albumin: 3.4 g/dL — ABNORMAL LOW (ref 3.5–5.0)
Alkaline Phosphatase: 32 U/L — ABNORMAL LOW (ref 38–126)
Anion gap: 8 (ref 5–15)
BUN: 14 mg/dL (ref 6–20)
CO2: 25 mmol/L (ref 22–32)
Calcium: 8.5 mg/dL — ABNORMAL LOW (ref 8.9–10.3)
Chloride: 106 mmol/L (ref 98–111)
Creatinine, Ser: 0.59 mg/dL (ref 0.44–1.00)
GFR calc Af Amer: >60 mL/min (ref >60)
GFR calc non Af Amer: >60 mL/min (ref >60)
Glucose, Bld: 101 mg/dL — ABNORMAL HIGH (ref 70–99)
Potassium: 4.3 mmol/L (ref 3.5–5.1)
Sodium: 139 mmol/L (ref 135–145)
Total Bilirubin: 0.3 mg/dL (ref 0.3–1.2)
Total Protein: 6.4 g/dL — ABNORMAL LOW (ref 6.5–8.1)

## 2019-10-05 LAB — CBC WITH DIFFERENTIAL/PLATELET
Abs Immature Granulocytes: 0.06 10*3/uL (ref 0.00–0.07)
Basophils Absolute: 0 10*3/uL (ref 0.0–0.1)
Basophils Relative: 0 %
Eosinophils Absolute: 0.2 10*3/uL (ref 0.0–0.5)
Eosinophils Relative: 3 %
HCT: 22.1 % — ABNORMAL LOW (ref 36.0–46.0)
Hemoglobin: 6.7 g/dL — CL (ref 12.0–15.0)
Immature Granulocytes: 1 %
Lymphocytes Relative: 21 %
Lymphs Abs: 1.8 10*3/uL (ref 0.7–4.0)
MCH: 28.9 pg (ref 26.0–34.0)
MCHC: 30.3 g/dL (ref 30.0–36.0)
MCV: 95.3 fL (ref 80.0–100.0)
Monocytes Absolute: 0.4 10*3/uL (ref 0.1–1.0)
Monocytes Relative: 5 %
Neutro Abs: 5.9 10*3/uL (ref 1.7–7.7)
Neutrophils Relative %: 70 %
Platelets: 292 10*3/uL (ref 150–400)
RBC: 2.32 MIL/uL — ABNORMAL LOW (ref 3.87–5.11)
RDW: 14.1 % (ref 11.5–15.5)
WBC: 8.4 10*3/uL (ref 4.0–10.5)
nRBC: 0 % (ref 0.0–0.2)

## 2019-10-05 LAB — HEMOGLOBIN AND HEMATOCRIT, BLOOD
HCT: 24.7 % — ABNORMAL LOW (ref 36.0–46.0)
Hemoglobin: 7.8 g/dL — ABNORMAL LOW (ref 12.0–15.0)

## 2019-10-05 LAB — FERRITIN: Ferritin: 21 ng/mL (ref 11–307)

## 2019-10-05 LAB — D-DIMER, QUANTITATIVE: D-Dimer, Quant: 1.26 ug/mL-FEU — ABNORMAL HIGH (ref 0.00–0.50)

## 2019-10-05 LAB — SARS CORONAVIRUS 2 BY RT PCR (HOSPITAL ORDER, PERFORMED IN ~~LOC~~ HOSPITAL LAB): SARS Coronavirus 2: POSITIVE — AB

## 2019-10-05 LAB — ABO/RH: ABO/RH(D): A POS

## 2019-10-05 LAB — PREPARE RBC (CROSSMATCH)

## 2019-10-05 LAB — C-REACTIVE PROTEIN: CRP: 2.2 mg/dL — ABNORMAL HIGH (ref <1.0)

## 2019-10-05 MED ORDER — SODIUM CHLORIDE 0.9% IV SOLUTION: Freq: Once | INTRAVENOUS | Status: AC

## 2019-10-05 MED ORDER — ZINC SULFATE 220 (50 ZN) MG PO CAPS
220.0000 mg | ORAL_CAPSULE | Freq: Every day | ORAL | Status: DC
  Administered 2019-10-05 – 2019-10-09 (×5): 220 mg via ORAL
  Filled 2019-10-05 (×5): qty 1

## 2019-10-05 MED ORDER — MEGESTROL ACETATE 20 MG PO TABS
20.0000 mg | ORAL_TABLET | Freq: Four times a day (QID) | ORAL | Status: DC
  Administered 2019-10-05 – 2019-10-09 (×16): 20 mg via ORAL
  Filled 2019-10-05 (×19): qty 1

## 2019-10-05 MED ORDER — KETOROLAC TROMETHAMINE 30 MG/ML IJ SOLN
30.0000 mg | Freq: Four times a day (QID) | INTRAMUSCULAR | Status: DC | PRN
  Administered 2019-10-05 – 2019-10-07 (×6): 30 mg via INTRAVENOUS
  Filled 2019-10-05 (×6): qty 1

## 2019-10-05 MED ORDER — SODIUM CHLORIDE 0.9 % IV SOLN
10.0000 mL/h | Freq: Once | INTRAVENOUS | Status: AC
  Administered 2019-10-05: 10 mL/h via INTRAVENOUS

## 2019-10-05 MED ORDER — ASCORBIC ACID 500 MG PO TABS
500.0000 mg | ORAL_TABLET | Freq: Every day | ORAL | Status: DC
  Administered 2019-10-05 – 2019-10-09 (×5): 500 mg via ORAL
  Filled 2019-10-05 (×5): qty 1

## 2019-10-05 NOTE — ED Notes (Signed)
Attempted second IV x 2, unsuccessful.  

## 2019-10-05 NOTE — ED Notes (Signed)
Paged mid-level about patient dizziness and lack of repeat hemoglobin. H&H to be ordered.

## 2019-10-05 NOTE — ED Provider Notes (Signed)
[facility] DEPT Provider Note   CSN: [id number] Arrival date & time: 10/05/19  0015     History Chief Complaint  Patient presents with  . Chest Pain  . Cough  . Covid Exposure    Marissa Salazar is a 39 y.o. female.  Patient presents to the emergency department with a chief complaint of vaginal bleeding and shortness of breath.  She states that she has been going through about a box of tampons per day.  She reports having been bleeding for over a week.  She states that her last hemoglobin was 9.  She states that she is feeling more short of breath now.  She is also positive for Covid.  She denies any fever, chills, productive cough.  She reports some mild left upper abdominal pain, but denies lower abdominal pain.  She states that she has history of fibroids and is scheduled to have surgery, but this had to be canceled because of her Covid status.  [facility]  The history is provided by the patient. No language interpreter was used.       Past Medical History:  Diagnosis Date  . Abnormal Pap smear    in past; normal colpo  . Allergic rhinitis   . Allergy   . Anemia    Hx fibroids  . Blood transfusion without reported diagnosis 05/2019  . Family history of breast cancer   . Family history of colon cancer   . Family history of pancreatic cancer   . Fibroid   . Iron deficiency anemia due to chronic blood loss   . Superficial vein thrombosis 2019   Left leg    Patient Active Problem List   Diagnosis Date Noted  . Uterine leiomyoma 04/16/2019  . 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 Family history of breast cancer   . Family history of colon cancer   . Family history of pancreatic cancer   . Iron deficiency anemia 03/29/2018  . Elevated pulse rate 03/29/2018  . Excessive menstruation at puberty 03/29/2018  . History of uterine fibroid 03/29/2018  . GERD (gastroesophageal reflux disease) 05/23/2017  . History  of Helicobacter pylori infection 05/23/2017  . Nevus 09/29/2016  . Family history of breast cancer in female 05/28/2014    Past Surgical History:  Procedure Laterality Date  . BREAST BIOPSY Left 06-12-14   fibroadenoma  . BUNIONECTOMY Right 2013  . COLPOSCOPY  2009  . ESOPHAGOGASTRODUODENOSCOPY N/A 07/17/2015   Procedure: ESOPHAGOGASTRODUODENOSCOPY (EGD);  Surgeon: Manya Silvas, MD;  Location[facility] ENDOSCOPY;  Service: Endoscopy;  Laterality: N/A;     OB History    Gravida  2   Para  2   Term  2   Preterm  0   AB  0   Living  2     SAB  0   TAB  0   Ectopic  0   Multiple  0   Live Births  1        Obstetric Comments  1st Menstrual Cycle: 13  1st Pregnancy:  79         Family History  Problem Relation Age of Onset  . Breast cancer Maternal Aunt 60       BRCA negative  . Colon cancer Maternal Grandmother 54  . Breast cancer Maternal Grandmother 4  . Hypertension Maternal Grandmother   . Breast cancer Other        GP  . Lung cancer  Other        GP  . Diabetes Paternal Uncle   . Heart attack Maternal Uncle   . Pancreatic cancer Maternal Aunt 52    Social History   Tobacco Use  . Smoking status: Never Smoker  . Smokeless tobacco: Never Used  Vaping Use  . Vaping Use: Never used  Substance Use Topics  . Alcohol use: Yes    Alcohol/week: 0.0 standard drinks    Comment: Rare  . Drug use: No    Home Medications Prior to Admission medications   Medication Sig Start Date End Date Taking? Authorizing Provider  ferrous sulfate 325 (65 FE) MG tablet TAKE 1 TABLET BY MOUTH EVERY DAY WITH BREAKFAST 09/29/19   Orson Slick, MD  Multiple Vitamin (MULTIVITAMIN) tablet Take 1 tablet by mouth daily.    [provider]  tranexamic acid (LYSTEDA) 650 MG TABS tablet tranexamic acid 650 mg tablet  TAKE 2 TABLETS 3 TIMES A DAY BY ORAL ROUTE AS NEEDED. Patient not taking: Reported on 09/23/2019    [provider]    Allergies    Ivp  dye [iodinated diagnostic agents]  Review of Systems   Review of Systems  All other systems reviewed and are negative.   Physical Exam Updated Vital Signs BP 121/88 (BP Location: Left Arm)   Pulse 91   Temp 98.2 F (36.8 C) (Oral)   Resp 12   Ht $R'5\' 4"'Is$  (1.626 m)   Wt 56.7 kg   LMP 09/12/2019 (Exact Date)   SpO2 100%   BMI 21.46 kg/m   Physical Exam Vitals and nursing note reviewed.  Constitutional:      General: She is not in acute distress.    Appearance: She is well-developed.  HENT:     Head: Normocephalic and atraumatic.  Eyes:     Conjunctiva/sclera: Conjunctivae normal.  Cardiovascular:     Rate and Rhythm: Normal rate and regular rhythm.     Heart sounds: No murmur heard.   Pulmonary:     Effort: Pulmonary effort is normal. No respiratory distress.     Breath sounds: Normal breath sounds.  Abdominal:     Palpations: Abdomen is soft.     Tenderness: There is no abdominal tenderness.  Musculoskeletal:        General: Normal range of motion.     Cervical back: Neck supple.  Skin:    General: Skin is warm and dry.  Neurological:     Mental Status: She is alert and oriented to person, place, and time.  Psychiatric:        Mood and Affect: Mood normal.        Behavior: Behavior normal.     ED Results / Procedures / Treatments   Labs (all labs ordered are listed, but only abnormal results are displayed) Labs Reviewed  SARS CORONAVIRUS 2 BY RT PCR ([facility]) - Abnormal; Notable for the following components:      Result Value   SARS Coronavirus 2 POSITIVE (*)    All other components within normal limits  CBC WITH DIFFERENTIAL/PLATELET - Abnormal; Notable for the following components:   RBC 2.32 (*)    Hemoglobin 6.7 (*)    HCT 22.1 (*)    All other components within normal limits  COMPREHENSIVE METABOLIC PANEL - Abnormal; Notable for the following components:   Glucose, Bld 101 (*)    Calcium 8.5 (*)     Total Protein 6.4 (*)  Albumin 3.4 (*)    Alkaline Phosphatase 32 (*)    All other components within normal limits  TYPE AND SCREEN  PREPARE RBC (CROSSMATCH)    EKG EKG Interpretation  Date/Time:  Saturday October 05 2019 01:20:45 EDT Ventricular Rate:  89 PR Interval:    QRS Duration: 86 QT Interval:  352 QTC Calculation: 429 R Axis:   71 Text Interpretation: Sinus rhythm Confirmed by Randal Buba, April ([id number]) on 10/05/2019 3:41:27 AM   Radiology DG Chest Portable 1 View  Result Date: 10/05/2019 CLINICAL DATA:  COVID positive cough EXAM: PORTABLE CHEST 1 VIEW COMPARISON:  02/06/2013 FINDINGS: The heart size and mediastinal contours are within normal limits. Both lungs are clear. The visualized skeletal structures are unremarkable. IMPRESSION: No active disease. Electronically Signed   By: Donavan Foil M.D.   On: 10/05/2019 02:45    Procedures .Critical Care Performed by: Montine Circle, PA-C Authorized by: Montine Circle, PA-C   Critical care provider statement:    Critical care time (minutes):  50   Critical care was necessary to treat or prevent imminent or life-threatening deterioration of the following conditions:  Circulatory failure   Critical care was time spent personally by me on the following activities:  Discussions with consultants, evaluation of patient's response to treatment, examination of patient, ordering and performing treatments and interventions, ordering and review of laboratory studies, ordering and review of radiographic studies, pulse oximetry, re-evaluation of patient's condition, obtaining history from patient or surrogate and review of old charts   (including critical care time)  Medications Ordered in ED Medications  megestrol (MEGACE) tablet 20 mg (has no administration in time range)  0.9 %  sodium chloride infusion (has no administration in time range)    ED Course  I have reviewed the triage vital signs and the nursing  notes.  Pertinent labs & imaging results that were available during my care of the patient were reviewed by me and considered in my medical decision making (see chart for details).    MDM Rules/Calculators/A&P                          This patient complains of SOB, this involves an extensive number of treatment options, and is a complaint that carries with it a high risk of complications and morbidity.    Differential Dx Covid, symptomatic anemia, PE, pneumonia  Pertinent Labs I ordered, reviewed, and interpreted labs, which included covid, which is positive, and CBC notable for HGB of 6.7.   Medications I ordered medication Megace for bleeding.  Sources Previous records obtained and reviewed scheduled for surgery for fibroids, but had to cancel due to covid.   Critical Interventions  Blood transfusion.  Reassessments After the interventions stated above, I reevaluated the patient and found still fatigued and SOB.  Consultants 0530 - Case discussed with Dr. [name] from [facility], who recommends admission and blood transfusion.  Recommends starting Megace 20 mg QID and states that she will see the patient after OB rounds.  Appreciate Dr. Hal Hope for admitting the patient.  Plan Admit for blood transfusion.    Final Clinical Impression(s) / ED Diagnoses Final diagnoses:  COVID-19  Symptomatic anemia    Rx / DC Orders ED Discharge Orders    None       Montine Circle, PA-C 10/05/19 2355    Palumbo, April, MD 10/05/19 [phone]

## 2019-10-05 NOTE — ED Triage Notes (Addendum)
Patient arrives after being exposed to a COVID patient at work. Patient was swabbed Wednesday through health at work and stated she was positive. Patient does not have a copy of those results. Patient states chest pain, non-productive cough, shortness of breath. Denies diarrhea and vomiting. Patient states her cycle has also been extremely heavy and she is having clots. Patient has hx of blood transfusions. Was supposed to have an [place] but had to cancel due to being COVID+

## 2019-10-05 NOTE — H&P (Signed)
History and Physical    Marissa Salazar JAS:[id number] DOB: 14-Jul-1980 DOA: 10/05/2019  PCP: Marissa Reid, PA-C  Patient coming from: Home  Chief Complaint: Shortness of breath, heavy menstrual bleeding  HPI: Marissa Salazar is a 39 y.o. female with medical history significant of uterine fibroids with heavy menstrual bleeding and recent exposure to COVID-19 while at work.  Reports approximately 1 week of heavy menstrual bleeding, needing up to a box of tampons per day with increasing shortness of breath.  Patient is followed by the GYN service for history of fibroids and reportedly was scheduled to have surgery however this was put on hold secondary to patient's recent Covid positive status.  Denies chest pain or nausea.  Does have some generalized abdominal discomfort  On further questioning, patient believes she may have contracted Covid from a family member whom she brought to urgent care 2 weeks prior to this hospital visit, later confirmed to be Covid positive himself.  Patient is fully vaccinated against COVID-19  ED Course: In the ED, patient was noted to have a hemoglobin of 6.7, down from 9.7 on 09/23/2019.  Covid test was confirmed positive via nasal swab.  Chest x-ray, reviewed, was clear of active disease.  Patient remained with 100% O2 saturation on room air.  2 units of PRBCs were ordered by EDP, currently in process.  GYN was consulted.  Hospitalist service was consulted for consideration for medical admission.  Review of Systems:  Review of Systems  Constitutional: Positive for malaise/fatigue. Negative for chills and fever.  HENT: Negative for congestion, ear discharge, ear pain and nosebleeds.   Eyes: Negative for double vision, photophobia and pain.  Respiratory: Positive for shortness of breath. Negative for hemoptysis and sputum production.   Cardiovascular: Negative for chest pain and claudication.  Gastrointestinal: Negative for abdominal pain, diarrhea and vomiting.    Genitourinary: Negative for frequency and urgency.  Musculoskeletal: Negative for back pain, falls and neck pain.  Neurological: Negative for tremors, sensory change, seizures and loss of consciousness.  Psychiatric/Behavioral: Negative for hallucinations and memory loss. The patient does not have insomnia.     Past Medical History:  Diagnosis Date  . Abnormal Pap smear    in past; normal colpo  . Allergic rhinitis   . Allergy   . Anemia    Hx fibroids  . Blood transfusion without reported diagnosis 05/2019  . Family history of breast cancer   . Family history of colon cancer   . Family history of pancreatic cancer   . Fibroid   . Iron deficiency anemia due to chronic blood loss   . Superficial vein thrombosis 2019   Left leg    Past Surgical History:  Procedure Laterality Date  . BREAST BIOPSY Left 06-12-14   fibroadenoma  . BUNIONECTOMY Right 2013  . COLPOSCOPY  2009  . ESOPHAGOGASTRODUODENOSCOPY N/A 07/17/2015   Procedure: ESOPHAGOGASTRODUODENOSCOPY (EGD);  Surgeon: Marissa Silvas, MD;  Location: [facility] ENDOSCOPY;  Service: Endoscopy;  Laterality: N/A;     reports that she has never smoked. She has never used smokeless tobacco. She reports current alcohol use. She reports that she does not use drugs.  Allergies  Allergen Reactions  . Ivp Dye [Iodinated Diagnostic Agents] Hives    Family History  Problem Relation Age of Onset  . Breast cancer Maternal Aunt 60       BRCA negative  . Colon cancer Maternal Grandmother 25  . Breast cancer Maternal Grandmother 62  . Hypertension Maternal Grandmother   .  Breast cancer Other        GP  . Lung cancer Other        GP  . Diabetes Paternal Uncle   . Heart attack Maternal Uncle   . Pancreatic cancer Maternal Aunt 44    Prior to Admission medications   Medication Sig Start Date End Date Taking? Authorizing Provider  ferrous sulfate 325 (65 FE) MG tablet TAKE 1 TABLET BY MOUTH EVERY DAY WITH BREAKFAST 09/29/19   Orson Slick, MD  Multiple Vitamin (MULTIVITAMIN) tablet Take 1 tablet by mouth daily.    [provider]  tranexamic acid (LYSTEDA) 650 MG TABS tablet tranexamic acid 650 mg tablet  TAKE 2 TABLETS 3 TIMES A DAY BY ORAL ROUTE AS NEEDED. Patient not taking: Reported on 09/23/2019    [provider]    Physical Exam: Vitals:   10/05/19 0120 10/05/19 0131 10/05/19 0730  BP: 121/88  114/65  Pulse: 91  97  Resp: 12  16  Temp: 98.2 F (36.8 C)    TempSrc: Oral    SpO2: 100%  100%  Weight:  56.7 kg   Height:  5' 4"  (1.626 m)     Constitutional: NAD, calm, comfortable Vitals:   10/05/19 0120 10/05/19 0131 10/05/19 0730  BP: 121/88  114/65  Pulse: 91  97  Resp: 12  16  Temp: 98.2 F (36.8 C)    TempSrc: Oral    SpO2: 100%  100%  Weight:  56.7 kg   Height:  5' 4"  (1.626 m)    Eyes: PERRL, lids and conjunctivae normal ENMT: External ears appear normal, head face traumatic, normocephalic Neck: normal, supple, no masses, no thyromegaly Respiratory: No auditory wheezing, normal respiratory effort Cardiovascular: Perfused, no notable JVD Abdomen: Nondistended, mild tenderness Musculoskeletal: no clubbing / cyanosis. No joint deformity upper and lower extremities. Good ROM, no contractures. Normal muscle tone.  Skin: no rashes, lesions, ulcers. No induration Neurologic: CN 2-12 grossly intact. Sensation intact,. Strength 5/5 in all 4.  Psychiatric: Normal judgment and insight. Alert and oriented x 3. Normal mood.    Labs on Admission: I have personally reviewed following labs and imaging studies  CBC: Recent Labs  Lab 10/05/19 0421  WBC 8.4  NEUTROABS 5.9  HGB 6.7*  HCT 22.1*  MCV 95.3  PLT 063   Basic Metabolic Panel: Recent Labs  Lab 10/05/19 0421  NA 139  K 4.3  CL 106  CO2 25  GLUCOSE 101*  BUN 14  CREATININE 0.59  CALCIUM 8.5*   GFR: Estimated Creatinine Clearance: 83.1 mL/min (by C-G formula based on SCr of 0.59 mg/dL). Liver Function  Tests: Recent Labs  Lab 10/05/19 0421  AST 30  ALT 24  ALKPHOS 32*  BILITOT 0.3  PROT 6.4*  ALBUMIN 3.4*   No results for input(s): LIPASE, AMYLASE in the last 168 hours. No results for input(s): AMMONIA in the last 168 hours. Coagulation Profile: No results for input(s): INR, PROTIME in the last 168 hours. Cardiac Enzymes: No results for input(s): CKTOTAL, CKMB, CKMBINDEX, TROPONINI in the last 168 hours. BNP (last 3 results) No results for input(s): PROBNP in the last 8760 hours. HbA1C: No results for input(s): HGBA1C in the last 72 hours. CBG: No results for input(s): GLUCAP in the last 168 hours. Lipid Profile: No results for input(s): CHOL, HDL, LDLCALC, TRIG, CHOLHDL, LDLDIRECT in the last 72 hours. Thyroid Function Tests: No results for input(s): TSH, T4TOTAL, FREET4, T3FREE, THYROIDAB in the last 72  hours. Anemia Panel: No results for input(s): VITAMINB12, FOLATE, FERRITIN, TIBC, IRON, RETICCTPCT in the last 72 hours. Urine analysis:    Component Value Date/Time   COLORURINE STRAW (A) 07/15/2015 1204   APPEARANCEUR CLEAR (A) 07/15/2015 1204   LABSPEC 1.009 07/15/2015 1204   PHURINE 7.0 07/15/2015 1204   GLUCOSEU NEGATIVE 07/15/2015 1204   HGBUR NEGATIVE 07/15/2015 1204   HGBUR negative 02/02/2009 1408   BILIRUBINUR neg 05/05/2016 0851   KETONESUR NEGATIVE 07/15/2015 1204   PROTEINUR neg 05/05/2016 0851   PROTEINUR NEGATIVE 07/15/2015 1204   UROBILINOGEN 0.2 05/05/2016 0851   UROBILINOGEN 0.2 02/02/2009 1408   NITRITE neg 05/05/2016 0851   NITRITE NEGATIVE 07/15/2015 1204   LEUKOCYTESUR Trace (A) 05/05/2016 0851   Sepsis Labs: !!!!!!!!!!!!!!!!!!!!!!!!!!!!!!!!!!!!!!!!!!!! @LABRCNTIP (procalcitonin:4,lacticidven:4) ) Recent Results (from the past 240 hour(s))  SARS Coronavirus 2 by RT PCR (hospital order, performed in [place] hospital lab) Nasopharyngeal Nasopharyngeal Swab     Status: Abnormal   Collection Time: 10/05/19  2:49 AM   Specimen:  Nasopharyngeal Swab  Result Value Ref Range Status   SARS Coronavirus 2 POSITIVE (A) NEGATIVE Final    Comment: CRITICAL RESULT CALLED TO, READ BACK BY AND VERIFIED WITH: DOSTER,S.,RN @ 0407 10/05/19 TURNER,S. (NOTE) SARS-CoV-2 target nucleic acids are DETECTED  SARS-CoV-2 RNA is generally detectable in upper respiratory specimens  during the acute phase of infection.  Positive results are indicative  of the presence of the identified virus, but do not rule out bacterial infection or co-infection with other pathogens not detected by the test.  Clinical correlation with patient history and  other diagnostic information is necessary to determine patient infection status.  The expected result is negative.  Fact Sheet for Patients:   StrictlyIdeas.no   Fact Sheet for Healthcare Providers:   BankingDealers.co.za    This test is not yet approved or cleared by the [place] FDA and  has been authorized for detection and/or diagnosis of SARS-CoV-2 by FDA under an Emergency Use Authorization (EUA).  This EUA will remain in effect (mea ning this test can be used) for the duration of  the COVID-19 declaration under Section 564(b)(1) of the Act, 21 U.S.C. section 360-bbb-3(b)(1), unless the authorization is terminated or revoked sooner.  Performed at [facility], [address], [place], [place] [address]      Radiological Exams on Admission: DG Chest Portable 1 View  Result Date: 10/05/2019 CLINICAL DATA:  COVID positive cough EXAM: PORTABLE CHEST 1 VIEW COMPARISON:  02/06/2013 FINDINGS: The heart size and mediastinal contours are within normal limits. Both lungs are clear. The visualized skeletal structures are unremarkable. IMPRESSION: No active disease. Electronically Signed   By: Donavan Foil M.D.   On: 10/05/2019 02:45    EKG: Independently reviewed. Sinus QTc 429  Assessment/Plan Principal Problem:    Symptomatic anemia Active Problems:   Iron deficiency anemia   Menorrhagia   History of uterine fibroid   Lab test positive for detection of COVID-19 virus   1. Symptomatic anemia 1. Likely secondary below heavy menstrual bleeding related to fibroids 2. 2 units of PRBCs ordered by EDP, currently in process 3. GYN service was called by EDP, GYN to see while patient in hospital 4. Patient reportedly was scheduled to follow-up with GYN as outpatient however this was canceled secondary to patient's Covid positive status 5. Follow CBC trends, continue to transfuse as needed 6. Continue patient with Megace per GYN recommendations 2. Menorrhagia 1. Per above, transfuse as needed 2. GYN to  see 3. History of uterine fibroids 1. Plan per above 4. Positive Covid test 1. Chest x-ray personally reviewed, clear 2. Patient currently afebrile, vital signs stable 3. We will check inflammatory markers and follow serial markers while in hospital 4. At this point, no indication for remdesivir or IV steroids 5. We will continue vitamin C and zinc  DVT prophylaxis: SCD's  Code Status: Full Family Communication: Pt in room  Disposition Plan:   Consults called: GYN Admission status: Observation as well likely require less than 2 midnight stay to stabilize patient's hemoglobin and work-up heavy menstrual bleed  Marylu Lund MD [facility] Pager On Amion  If 7PM-7AM, please contact night-coverage  10/05/2019, 8:09 AM

## 2019-10-05 NOTE — ED Notes (Signed)
Nurse is in [place] room, unable to take report at this time.

## 2019-10-05 NOTE — ED Notes (Signed)
Notified mid-level of patient's repeat hemoglobin. Orders to be placed.

## 2019-10-05 NOTE — ED Notes (Signed)
Date and time results received: 10/05/19 4:07 AM  (use smartphrase ".now" to insert current time)  Test: COVID Critical Value: POSITIVE  Name of Provider Notified: Palumbo  Orders Received? Or Actions Taken?: Orders Received - See Orders for details

## 2019-10-06 DIAGNOSIS — D649 Anemia, unspecified: Secondary | ICD-10-CM | POA: Diagnosis not present

## 2019-10-06 DIAGNOSIS — Z86018 Personal history of other benign neoplasm: Secondary | ICD-10-CM

## 2019-10-06 DIAGNOSIS — D62 Acute posthemorrhagic anemia: Secondary | ICD-10-CM | POA: Diagnosis present

## 2019-10-06 DIAGNOSIS — Z833 Family history of diabetes mellitus: Secondary | ICD-10-CM | POA: Diagnosis not present

## 2019-10-06 DIAGNOSIS — N920 Excessive and frequent menstruation with regular cycle: Secondary | ICD-10-CM | POA: Diagnosis present

## 2019-10-06 DIAGNOSIS — R05 Cough: Secondary | ICD-10-CM | POA: Diagnosis present

## 2019-10-06 DIAGNOSIS — U071 COVID-19: Secondary | ICD-10-CM | POA: Diagnosis present

## 2019-10-06 DIAGNOSIS — D500 Iron deficiency anemia secondary to blood loss (chronic): Secondary | ICD-10-CM | POA: Diagnosis not present

## 2019-10-06 DIAGNOSIS — Z800 Family history of malignant neoplasm of digestive organs: Secondary | ICD-10-CM | POA: Diagnosis not present

## 2019-10-06 DIAGNOSIS — D259 Leiomyoma of uterus, unspecified: Secondary | ICD-10-CM | POA: Diagnosis present

## 2019-10-06 DIAGNOSIS — Z803 Family history of malignant neoplasm of breast: Secondary | ICD-10-CM | POA: Diagnosis not present

## 2019-10-06 DIAGNOSIS — Z8249 Family history of ischemic heart disease and other diseases of the circulatory system: Secondary | ICD-10-CM | POA: Diagnosis not present

## 2019-10-06 LAB — COMPREHENSIVE METABOLIC PANEL
ALT: 17 U/L (ref 0–44)
AST: 27 U/L (ref 15–41)
Albumin: 2.9 g/dL — ABNORMAL LOW (ref 3.5–5.0)
Alkaline Phosphatase: 30 U/L — ABNORMAL LOW (ref 38–126)
Anion gap: 7 (ref 5–15)
BUN: 9 mg/dL (ref 6–20)
CO2: 22 mmol/L (ref 22–32)
Calcium: 8 mg/dL — ABNORMAL LOW (ref 8.9–10.3)
Chloride: 109 mmol/L (ref 98–111)
Creatinine, Ser: 0.5 mg/dL (ref 0.44–1.00)
GFR calc Af Amer: >60 mL/min (ref >60)
GFR calc non Af Amer: >60 mL/min (ref >60)
Glucose, Bld: 91 mg/dL (ref 70–99)
Potassium: 3.7 mmol/L (ref 3.5–5.1)
Sodium: 138 mmol/L (ref 135–145)
Total Bilirubin: 0.4 mg/dL (ref 0.3–1.2)
Total Protein: 5.8 g/dL — ABNORMAL LOW (ref 6.5–8.1)

## 2019-10-06 LAB — CBC
HCT: 25.9 % — ABNORMAL LOW (ref 36.0–46.0)
Hemoglobin: 8.4 g/dL — ABNORMAL LOW (ref 12.0–15.0)
MCH: 28.3 pg (ref 26.0–34.0)
MCHC: 32.4 g/dL (ref 30.0–36.0)
MCV: 87.2 fL (ref 80.0–100.0)
Platelets: 247 10*3/uL (ref 150–400)
RBC: 2.97 MIL/uL — ABNORMAL LOW (ref 3.87–5.11)
RDW: 18.6 % — ABNORMAL HIGH (ref 11.5–15.5)
WBC: 15 10*3/uL — ABNORMAL HIGH (ref 4.0–10.5)
nRBC: 0 % (ref 0.0–0.2)

## 2019-10-06 LAB — RETICULOCYTES
Immature Retic Fract: 24.7 % — ABNORMAL HIGH (ref 2.3–15.9)
RBC.: 3.12 MIL/uL — ABNORMAL LOW (ref 3.87–5.11)
Retic Count, Absolute: 81.1 10*3/uL (ref 19.0–186.0)
Retic Ct Pct: 2.6 % (ref 0.4–3.1)

## 2019-10-06 LAB — VITAMIN B12: Vitamin B-12: 706 pg/mL (ref 180–914)

## 2019-10-06 LAB — C-REACTIVE PROTEIN: CRP: 3.1 mg/dL — ABNORMAL HIGH (ref <1.0)

## 2019-10-06 LAB — IRON AND TIBC
Iron: 13 ug/dL — ABNORMAL LOW (ref 28–170)
Saturation Ratios: 5 % — ABNORMAL LOW (ref 10.4–31.8)
TIBC: 265 ug/dL (ref 250–450)
UIBC: 252 ug/dL

## 2019-10-06 LAB — FOLATE: Folate: 22.3 ng/mL (ref >5.9)

## 2019-10-06 LAB — FERRITIN: Ferritin: 31 ng/mL (ref 11–307)

## 2019-10-06 LAB — D-DIMER, QUANTITATIVE: D-Dimer, Quant: 0.45 ug/mL-FEU (ref 0.00–0.50)

## 2019-10-06 LAB — HIV ANTIBODY (ROUTINE TESTING W REFLEX): HIV Screen 4th Generation wRfx: NONREACTIVE

## 2019-10-06 MED ORDER — SODIUM CHLORIDE 0.9 % IV SOLN
510.0000 mg | Freq: Once | INTRAVENOUS | Status: AC
  Administered 2019-10-06: 510 mg via INTRAVENOUS
  Filled 2019-10-06: qty 510

## 2019-10-06 MED ORDER — SODIUM CHLORIDE 0.9 % IV SOLN: INTRAVENOUS | Status: DC

## 2019-10-06 MED ORDER — DIPHENHYDRAMINE HCL 50 MG/ML IJ SOLN: 25.0000 mg | Freq: Four times a day (QID) | INTRAMUSCULAR | Status: DC | PRN

## 2019-10-06 MED ORDER — SODIUM CHLORIDE 0.9 % IV SOLN: INTRAVENOUS | Status: AC

## 2019-10-06 NOTE — Progress Notes (Signed)
Pt arrived to unit via stretcher room 1534. Alert and oriented x3. Unsteady gait to bed. Pt oriented to room and callbell with no complications.initial assessment and admission history completed. Will continue to monitor

## 2019-10-06 NOTE — Progress Notes (Signed)
[facility]                                                                              Patient Demographics  Marissa Salazar, is a 39 y.o. female, DOB - [birth year], TWS:[id number]  Admit date - 10/05/2019   Admitting Physician Rise Patience, MD  Outpatient Primary MD for the patient is Lorrene Reid, PA-C  Outpatient specialists:   LOS - 0  days   Medical records reviewed and are as summarized below:    Chief Complaint  Patient presents with  . Chest Pain  . Cough  . Covid Exposure       Brief summary   Patient is a  39 year old female with history of uterine fibroids, heavy menstrual bleeding, recent exposure to COVID-19. Reported approximately 1 week of heavy menstrual bleeding, needing up to box of tampons per day with increasing shortness of breath, dizziness, generalized weakness.  Patient is followed by GYN service for the history of fibroids and was scheduled to have surgery however put on hold secondary to Covid positive status.  No chest pain or nausea.  Does have some generalized abdominal discomfort. Patient is fully vaccinated against COVID-19. In ED, hemoglobin 6.7, COVID-19 test positive.  Chest x-ray clear of active disease.  O2 sats 100% on room air.  Patient was admitted for further work-up  Assessment & Plan    Principal Problem:   Symptomatic acute on chronic anemia due to menorrhagia, uterine fibroids -Per patient having heavy menstrual bleeding for last 1 week, still bleeding -Received 2 units packed RBCs, hemoglobin 8.4, still feeling dizzy and unsteady -OB/GYN on call, Dr [name] was consulted by EDP, recommended Megace and transfusion. -Continue Megace, transfuse for hemoglobin less than 8 -Follow anemia panel ferritin on admission only 21 despite being Covid positive.  Will give IV Feraheme. -Still feels dizzy, follow orthostatics, IV fluid hydration   Active Problems:   Iron deficiency anemia -Transfuse IV Feraheme x1,  follow anemia panel    Menorrhagia secondary to history of uterine fibroid -Plan for surgery currently on hold due to Covid positive status -For now continue Megace, transfuse, supportive treatment  Lab test positive for detection of COVID-19 virus -Currently no chest pain, shortness of breath, fevers or chills.  No nausea vomiting or diarrhea.  No hypoxia.  Chest x-ray clear -Hold off on steroids or remdesivir  Code Status: Full code DVT Prophylaxis:  SCD's Family Communication: Discussed all imaging results, lab results, explained to the patient or *   Disposition Plan:     Status is: Observation  The patient remains OBS appropriate and will d/c before 2 midnights.  Dispo: The patient is from: Home              Anticipated d/c is to: Home              Anticipated d/c date is: 1 day              Patient currently is not medically stable to d/c.      Time Spent in minutes   32mins  Procedures:  None   Consultants:   OBGYN  Antimicrobials:   Anti-infectives (From admission, onward)   None          Medications  Scheduled Meds: . vitamin C  500 mg Oral Daily  . megestrol  20 mg Oral QID  . zinc sulfate  220 mg Oral Daily   Continuous Infusions: . sodium chloride 100 mL/hr at 10/06/19 0945  . sodium chloride     PRN Meds:.ketorolac      Subjective:   Marissa Salazar was seen and examined today. + dizziness, generalized weakness. Patient denies chest pain, shortness of breath, abdominal pain, N/V/D/C, numbess, tingling. No acute events overnight.    Objective:   Vitals:   10/06/19 0220 10/06/19 0413 10/06/19 0612 10/06/19 1021  BP: 118/72 109/67 106/69 102/60  Pulse: 93 86 91 95  Resp: 20 20 20    Temp: 98 F (36.7 C) 98.9 F (37.2 C) 98.2 F (36.8 C)   TempSrc: Oral Oral Oral   SpO2: 100% 99% 100% 100%  Weight:      Height:        Intake/Output Summary (Last 24 hours) at 10/06/2019 1104 Last data filed at 10/06/2019 0500 Gross per 24 hour    Intake 1600 ml  Output --  Net 1600 ml     Wt Readings from Last 3 Encounters:  10/05/19 56.7 kg  09/23/19 58.5 kg  05/10/19 58.3 kg     Exam  General: Alert and oriented x 3, NAD  Cardiovascular: S1 S2 auscultated, no murmurs, RRR  Respiratory: Clear to auscultation bilaterally, no wheezing, rales or rhonchi  Gastrointestinal: Soft, mild diffuse TTP , nondistended, + bowel sounds  Ext: no pedal edema bilaterally  Neuro: no new deficits  Musculoskeletal: No digital cyanosis, clubbing  Skin: No rashes  Psych: Normal affect and demeanor, alert and oriented x3    Data Reviewed:  I have personally reviewed following labs and imaging studies  Micro Results Recent Results (from the past 240 hour(s))  SARS Coronavirus 2 by RT PCR (hospital order, performed in [facility] hospital lab) Nasopharyngeal Nasopharyngeal Swab     Status: Abnormal   Collection Time: 10/05/19  2:49 AM   Specimen: Nasopharyngeal Swab  Result Value Ref Range Status   SARS Coronavirus 2 POSITIVE (A) NEGATIVE Final    Comment: CRITICAL RESULT CALLED TO, READ BACK BY AND VERIFIED WITH: DOSTER,S.,RN @ 0407 10/05/19 TURNER,S. (NOTE) SARS-CoV-2 target nucleic acids are DETECTED  SARS-CoV-2 RNA is generally detectable in upper respiratory specimens  during the acute phase of infection.  Positive results are indicative  of the presence of the identified virus, but do not rule out bacterial infection or co-infection with other pathogens not detected by the test.  Clinical correlation with patient history and  other diagnostic information is necessary to determine patient infection status.  The expected result is negative.  Fact Sheet for Patients:   StrictlyIdeas.no   Fact Sheet for Healthcare Providers:   BankingDealers.co.za    This test is not yet approved or cleared by the [place] FDA and  has been authorized for detection and/or diagnosis  of SARS-CoV-2 by FDA under an Emergency Use Authorization (EUA).  This EUA will remain in effect (mea ning this test can be used) for the duration of  the COVID-19 declaration under Section 564(b)(1) of the Act, 21 U.S.C. section 360-bbb-3(b)(1), unless the authorization is terminated or revoked sooner.  Performed at [facility], [place] [address] [place], [place] [address]     Radiology Reports DG Chest Portable  1 View  Result Date: 10/05/2019 CLINICAL DATA:  COVID positive cough EXAM: PORTABLE CHEST 1 VIEW COMPARISON:  02/06/2013 FINDINGS: The heart size and mediastinal contours are within normal limits. Both lungs are clear. The visualized skeletal structures are unremarkable. IMPRESSION: No active disease. Electronically Signed   By: Donavan Foil M.D.   On: 10/05/2019 02:45    Lab Data:  CBC: Recent Labs  Lab 10/05/19 0421 10/05/19 2040 10/06/19 0709  WBC 8.4  --  15.0*  NEUTROABS 5.9  --   --   HGB 6.7* 7.8* 8.4*  HCT 22.1* 24.7* 25.9*  MCV 95.3  --  87.2  PLT 292  --  583   Basic Metabolic Panel: Recent Labs  Lab 10/05/19 0421 10/06/19 0709  NA 139 138  K 4.3 3.7  CL 106 109  CO2 25 22  GLUCOSE 101* 91  BUN 14 9  CREATININE 0.59 0.50  CALCIUM 8.5* 8.0*   GFR: Estimated Creatinine Clearance: 83.1 mL/min (by C-G formula based on SCr of 0.5 mg/dL). Liver Function Tests: Recent Labs  Lab 10/05/19 0421 10/06/19 0709  AST 30 27  ALT 24 17  ALKPHOS 32* 30*  BILITOT 0.3 0.4  PROT 6.4* 5.8*  ALBUMIN 3.4* 2.9*   No results for input(s): LIPASE, AMYLASE in the last 168 hours. No results for input(s): AMMONIA in the last 168 hours. Coagulation Profile: No results for input(s): INR, PROTIME in the last 168 hours. Cardiac Enzymes: No results for input(s): CKTOTAL, CKMB, CKMBINDEX, TROPONINI in the last 168 hours. BNP (last 3 results) No results for input(s): PROBNP in the last 8760 hours. HbA1C: No results for input(s): HGBA1C in the  last 72 hours. CBG: No results for input(s): GLUCAP in the last 168 hours. Lipid Profile: No results for input(s): CHOL, HDL, LDLCALC, TRIG, CHOLHDL, LDLDIRECT in the last 72 hours. Thyroid Function Tests: No results for input(s): TSH, T4TOTAL, FREET4, T3FREE, THYROIDAB in the last 72 hours. Anemia Panel: Recent Labs    10/05/19 2040 10/06/19 0709  FERRITIN 21 31   Urine analysis:    Component Value Date/Time   COLORURINE STRAW (A) 07/15/2015 1204   APPEARANCEUR CLEAR (A) 07/15/2015 1204   LABSPEC 1.009 07/15/2015 1204   PHURINE 7.0 07/15/2015 1204   GLUCOSEU NEGATIVE 07/15/2015 1204   HGBUR NEGATIVE 07/15/2015 1204   HGBUR negative 02/02/2009 1408   BILIRUBINUR neg 05/05/2016 [place] 07/15/2015 1204   PROTEINUR neg 05/05/2016 0851   PROTEINUR NEGATIVE 07/15/2015 1204   UROBILINOGEN 0.2 05/05/2016 0851   UROBILINOGEN 0.2 02/02/2009 1408   NITRITE neg 05/05/2016 0851   NITRITE NEGATIVE 07/15/2015 1204   LEUKOCYTESUR Trace (A) 05/05/2016 0851     Marissa Salazar M.D. [facility] 10/06/2019, 11:04 AM   Call night coverage person covering after 7pm

## 2019-10-07 LAB — COMPREHENSIVE METABOLIC PANEL
ALT: 18 U/L (ref 0–44)
AST: 21 U/L (ref 15–41)
Albumin: 2.7 g/dL — ABNORMAL LOW (ref 3.5–5.0)
Alkaline Phosphatase: 34 U/L — ABNORMAL LOW (ref 38–126)
Anion gap: 8 (ref 5–15)
BUN: 12 mg/dL (ref 6–20)
CO2: 20 mmol/L — ABNORMAL LOW (ref 22–32)
Calcium: 8.6 mg/dL — ABNORMAL LOW (ref 8.9–10.3)
Chloride: 109 mmol/L (ref 98–111)
Creatinine, Ser: 0.63 mg/dL (ref 0.44–1.00)
GFR calc Af Amer: >60 mL/min (ref >60)
GFR calc non Af Amer: >60 mL/min (ref >60)
Glucose, Bld: 100 mg/dL — ABNORMAL HIGH (ref 70–99)
Potassium: 4.2 mmol/L (ref 3.5–5.1)
Sodium: 137 mmol/L (ref 135–145)
Total Bilirubin: 0.4 mg/dL (ref 0.3–1.2)
Total Protein: 5.4 g/dL — ABNORMAL LOW (ref 6.5–8.1)

## 2019-10-07 LAB — CBC
HCT: 25.8 % — ABNORMAL LOW (ref 36.0–46.0)
Hemoglobin: 8.1 g/dL — ABNORMAL LOW (ref 12.0–15.0)
MCH: 27.7 pg (ref 26.0–34.0)
MCHC: 31.4 g/dL (ref 30.0–36.0)
MCV: 88.4 fL (ref 80.0–100.0)
Platelets: 258 10*3/uL (ref 150–400)
RBC: 2.92 MIL/uL — ABNORMAL LOW (ref 3.87–5.11)
RDW: 18.2 % — ABNORMAL HIGH (ref 11.5–15.5)
WBC: 14.5 10*3/uL — ABNORMAL HIGH (ref 4.0–10.5)
nRBC: 0 % (ref 0.0–0.2)

## 2019-10-07 LAB — PREPARE RBC (CROSSMATCH)

## 2019-10-07 LAB — FERRITIN: Ferritin: 88 ng/mL (ref 11–307)

## 2019-10-07 LAB — C-REACTIVE PROTEIN: CRP: 6.7 mg/dL — ABNORMAL HIGH (ref <1.0)

## 2019-10-07 LAB — D-DIMER, QUANTITATIVE: D-Dimer, Quant: 0.47 ug/mL-FEU (ref 0.00–0.50)

## 2019-10-07 MED ORDER — ACETAMINOPHEN 325 MG PO TABS
650.0000 mg | ORAL_TABLET | Freq: Four times a day (QID) | ORAL | Status: DC | PRN
  Administered 2019-10-07 – 2019-10-08 (×4): 650 mg via ORAL
  Filled 2019-10-07 (×5): qty 2

## 2019-10-07 MED ORDER — TRAMADOL-ACETAMINOPHEN 37.5-325 MG PO TABS
1.0000 | ORAL_TABLET | Freq: Four times a day (QID) | ORAL | Status: DC | PRN
  Administered 2019-10-07 – 2019-10-09 (×6): 1 via ORAL
  Filled 2019-10-07 (×6): qty 1

## 2019-10-07 MED ORDER — SODIUM CHLORIDE 0.9% IV SOLUTION: Freq: Once | INTRAVENOUS | Status: AC

## 2019-10-07 NOTE — Progress Notes (Signed)
PROGRESS NOTE  Marissa Salazar  DOB: 29-Dec-1980  PCP: Lorrene Reid, PA-C XHB:[id number]  DOA: 10/05/2019  LOS: 1 day   Chief Complaint  Patient presents with  . Chest Pain  . Cough  . Covid Exposure   Brief narrative: Marissa Salazar is a 39 y.o. female with PMH of uterine fibroids, chronic menstrual bleeds requiring multiple blood transfusion, iron deficiency anemia, and recent exposure to COVID-19. Patient presented to the ED on 10/05/2019 with 1 week history of heavy menstrual bleeding, requiring up to a box of tampons per day.  Associated with progressively worsening shortness of breath, dizziness, generalized weakness.  Patient has known history of uterine fibroids and was scheduled to have surgery by GYN however put on hold secondary to Covid positive status.   Patient is fully vaccinated against COVID-19.    In ED, hemoglobin 6.7,  Covid PCR positive again. Chest x-ray did not show any active disease.   O2 sats 100% on room air.  Patient was admitted primarily for active blood loss anemia  Subjective: Patient was seen and examined this morning.  Pleasant young Caucasian female.  Propped up in bed.  Not in distress. However, still continues to have vaginal bleeding but less than at presentation.  Continues to have dizziness on walking to the bathroom. Chart reviewed. No fever, hemodynamically stable. Labs this morning showed hemoglobin slightly down from 8.4 yesterday to 8.1 today.  Assessment/Plan: Symptomatic acute blood loss anemia  Chronic anemia due to menorrhagia, uterine fibroids -Has uterine fibroids with intermittent bleeding for last 2 years, was in preparation of surgical removal as an outpatient.  -Presented with heavy menstrual bleeding for 1 week. -On presentation, patient was actively bleeding and her hemoglobin was low at 6.7.   -Per previous hospitalist note, OB/GYN on call, Dr [name] was consulted by EDP, recommended Megace and transfusion. -Hemoglobin  improved to 8.4 with 2 units of PRBCs.  However she is still bleeding, still having dizziness and hemoglobin is down today to 8.1.  We will give her 1 more unit of blood today and monitor. -Continue Megace at 20 mg 4 times a day. Recent Labs    04/16/19 0952 05/10/19 1408 05/20/19 1543 09/23/19 1501 10/05/19 0421 10/05/19 2040 10/06/19 0709 10/07/19 0452  HGB 9.3* 10.7* 10.6* 9.7* 6.7* 7.8* 8.4* 8.1*   Chronic iron deficiency anemia -Ferritin on admission was low at 31 despite Covid positive status. -Iron saturation low at 5% only. -8/29, 1 dose of IV Feraheme was given. Recent Labs    10/06/19 0709 10/06/19 1043 10/07/19 0452  VITAMINB12  --  706  --   FOLATE  --  22.3  --   FERRITIN 31  --  88  TIBC  --  265  --   IRON  --  13*  --   RETICCTPCT  --  2.6  --    COVID-19 virus positive -Her presenting symptoms of shortness of breath is secondary to anemia.  She does not have any symptoms related to Covid infection.  She is fully vaccinated.   -She is not requiring any active treatment for Covid.  However she needs to be in isolation for a duration of 2 weeks from the day of 1st positivity  Mobility: Encourage ambulation inside the room Code Status:   Code Status: Full Code  Nutritional status: Body mass index is 21.46 kg/m.     Diet Order            Diet regular Room service appropriate?  Yes; Fluid consistency: Thin  Diet effective now                 DVT prophylaxis: SCDs Start: 10/05/19 0849   Antimicrobials:  None Fluid: None  Consultants: GYN on the phone by ED Family Communication:  None  Status is: Inpatient  Remains inpatient appropriate because:Hemodynamically unstable, Ongoing diagnostic testing needed not appropriate for outpatient work up and IV treatments appropriate due to intensity of illness or inability to take PO   Dispo: The patient is from: Home              Anticipated d/c is to: Home              Anticipated d/c date is: 1 day               Patient currently is not medically stable to d/c.       Infusions:  . sodium chloride 10 mL/hr at 10/06/19 2145    Scheduled Meds: . vitamin C  500 mg Oral Daily  . megestrol  20 mg Oral QID  . zinc sulfate  220 mg Oral Daily    Antimicrobials: Anti-infectives (From admission, onward)   None      PRN meds: acetaminophen, diphenhydrAMINE   Objective: Vitals:   10/07/19 0859 10/07/19 1108  BP: 95/68 103/72  Pulse: 74 88  Resp: 19 20  Temp: 97.9 F (36.6 C) 98.6 F (37 C)  SpO2:      Intake/Output Summary (Last 24 hours) at 10/07/2019 1327 Last data filed at 10/07/2019 1053 Gross per 24 hour  Intake 957 ml  Output --  Net 957 ml   Filed Weights   10/05/19 0131  Weight: 56.7 kg   Weight change:  Body mass index is 21.46 kg/m.   Physical Exam: General exam: Appears calm and comfortable.  Not in physical distress at rest Skin: No rashes, lesions or ulcers. HEENT: Atraumatic, normocephalic, supple neck, no obvious bleeding Lungs: Clear to auscultate bilaterally CVS: Regular rate and rhythm with no murmur GI/Abd soft, nontender, nondistended, bowel sound present CNS: Alert, awake, oriented x3 Psychiatry: Mood appropriate Extremities: No pedal edema, no calf tenderness  Data Review: I have personally reviewed the laboratory data and studies available.  Recent Labs  Lab 10/05/19 0421 10/05/19 2040 10/06/19 0709 10/07/19 0452  WBC 8.4  --  15.0* 14.5*  NEUTROABS 5.9  --   --   --   HGB 6.7* 7.8* 8.4* 8.1*  HCT 22.1* 24.7* 25.9* 25.8*  MCV 95.3  --  87.2 88.4  PLT 292  --  247 258   Recent Labs  Lab 10/05/19 0421 10/06/19 0709 10/07/19 0452  NA 139 138 137  K 4.3 3.7 4.2  CL 106 109 109  CO2 25 22 20*  GLUCOSE 101* 91 100*  BUN 14 9 12   CREATININE 0.59 0.50 0.63  CALCIUM 8.5* 8.0* 8.6*   Lab Results  Component Value Date   HGBA1C 4.8 03/29/2018       Component Value Date/Time   CHOL 190 04/16/2019 0952   TRIG 43 04/16/2019  0952   HDL 109 04/16/2019 0952   CHOLHDL 1.7 04/16/2019 0952   CHOLHDL 2 09/21/2016 1144   VLDL 11.2 09/21/2016 1144   LDLCALC 72 04/16/2019 0952   LDLDIRECT 59 11/28/2008 2147   LABVLDL 9 04/16/2019 0952   Signed, Terrilee Croak, MD [facility] Pager: [phone] (Secure Chat preferred). 10/07/2019

## 2019-10-08 LAB — FERRITIN: Ferritin: 295 ng/mL (ref 11–307)

## 2019-10-08 LAB — COMPREHENSIVE METABOLIC PANEL
ALT: 44 U/L (ref 0–44)
AST: 67 U/L — ABNORMAL HIGH (ref 15–41)
Albumin: 2.7 g/dL — ABNORMAL LOW (ref 3.5–5.0)
Alkaline Phosphatase: 39 U/L (ref 38–126)
Anion gap: 8 (ref 5–15)
BUN: 15 mg/dL (ref 6–20)
CO2: 20 mmol/L — ABNORMAL LOW (ref 22–32)
Calcium: 8.6 mg/dL — ABNORMAL LOW (ref 8.9–10.3)
Chloride: 109 mmol/L (ref 98–111)
Creatinine, Ser: 0.49 mg/dL (ref 0.44–1.00)
GFR calc Af Amer: >60 mL/min (ref >60)
GFR calc non Af Amer: >60 mL/min (ref >60)
Glucose, Bld: 100 mg/dL — ABNORMAL HIGH (ref 70–99)
Potassium: 4.1 mmol/L (ref 3.5–5.1)
Sodium: 137 mmol/L (ref 135–145)
Total Bilirubin: 0.5 mg/dL (ref 0.3–1.2)
Total Protein: 5.8 g/dL — ABNORMAL LOW (ref 6.5–8.1)

## 2019-10-08 LAB — TYPE AND SCREEN
ABO/RH(D): A POS
Antibody Screen: NEGATIVE
Unit division: 0
Unit division: 0
Unit division: 0
Unit division: 0

## 2019-10-08 LAB — CBC
HCT: 29.7 % — ABNORMAL LOW (ref 36.0–46.0)
Hemoglobin: 9.4 g/dL — ABNORMAL LOW (ref 12.0–15.0)
MCH: 28.3 pg (ref 26.0–34.0)
MCHC: 31.6 g/dL (ref 30.0–36.0)
MCV: 89.5 fL (ref 80.0–100.0)
Platelets: 306 10*3/uL (ref 150–400)
RBC: 3.32 MIL/uL — ABNORMAL LOW (ref 3.87–5.11)
RDW: 17.3 % — ABNORMAL HIGH (ref 11.5–15.5)
WBC: 13.5 10*3/uL — ABNORMAL HIGH (ref 4.0–10.5)
nRBC: 0 % (ref 0.0–0.2)

## 2019-10-08 LAB — BPAM RBC
Blood Product Expiration Date: 202109112359
Blood Product Expiration Date: 202109112359
Blood Product Expiration Date: 202109192359
Blood Product Expiration Date: 202109192359
ISSUE DATE / TIME: 202108280820
ISSUE DATE / TIME: 202108281204
ISSUE DATE / TIME: 202108290011
ISSUE DATE / TIME: 202108301034
Unit Type and Rh: 6200
Unit Type and Rh: 6200
Unit Type and Rh: 6200
Unit Type and Rh: 6200

## 2019-10-08 LAB — D-DIMER, QUANTITATIVE: D-Dimer, Quant: 0.4 ug/mL-FEU (ref 0.00–0.50)

## 2019-10-08 LAB — C-REACTIVE PROTEIN: CRP: 4.7 mg/dL — ABNORMAL HIGH (ref <1.0)

## 2019-10-08 MED ORDER — FERROUS SULFATE 325 (65 FE) MG PO TABS
325.0000 mg | ORAL_TABLET | Freq: Every day | ORAL | Status: DC
  Administered 2019-10-09: 325 mg via ORAL
  Filled 2019-10-08: qty 1

## 2019-10-08 MED ORDER — TRANEXAMIC ACID 650 MG PO TABS
1300.0000 mg | ORAL_TABLET | Freq: Three times a day (TID) | ORAL | Status: DC
  Administered 2019-10-08 – 2019-10-09 (×4): 1300 mg via ORAL
  Filled 2019-10-08 (×6): qty 2

## 2019-10-08 NOTE — Telephone Encounter (Addendum)
Left message to call Sharee Pimple, RN at [facility].   Per review of Epic, patient admitted to [facility] for Covid 19 and symptomatic anemia.   Routing to Dr. Antony Blackbird.

## 2019-10-08 NOTE — Progress Notes (Signed)
PROGRESS NOTE  Marissa Salazar  DOB: 1980-09-01  PCP: Lorrene Reid, PA-C EPP:[id number]  DOA: 10/05/2019  LOS: 2 days   Chief Complaint  Patient presents with  . Chest Pain  . Cough  . Covid Exposure   Brief narrative: Marissa Salazar is a 39 y.o. female with PMH of uterine fibroids, chronic menstrual bleeds requiring multiple blood transfusion, iron deficiency anemia, and recent exposure to COVID-19. Patient presented to the ED on 10/05/2019 with 1 week history of heavy menstrual bleeding, requiring up to a box of tampons per day.  Associated with progressively worsening shortness of breath, dizziness, generalized weakness.  Patient has known history of uterine fibroids and was scheduled to have surgery by GYN Dr. [name] however put on hold secondary to Covid positive status.   Patient is fully vaccinated against COVID-19.    In ED, hemoglobin 6.7,  Covid PCR positive again. Chest x-ray did not show any active disease.   O2 sats 100% on room air.  Patient was admitted primarily for active blood loss anemia  Subjective: Patient was seen and examined this morning.  Still continues to have vaginal bleeding and dizziness on standing up.  -Hemoglobin improved to 9.4 this morning.   Assessment/Plan: Symptomatic acute blood loss anemia  Chronic anemia due to menorrhagia, uterine fibroids -Has uterine fibroids with intermittent bleeding for last 2 years, was in preparation of surgical removal as an outpatient.  -Presented with heavy menstrual bleeding for 1 week. -On presentation, patient was actively bleeding and her hemoglobin was low at 6.7.   -Per previous hospitalist note, OB/GYN on call, Dr [name] was consulted by EDP, recommended Megace and transfusion. -Hemoglobin improved to 9.4 this morning with a total of 3 units of PRBCs this admission.  Patient still continues to bleed and feels dizzy.  -I discussed with GYN on-call Dr. [name] this morning.  She suggested to start on  tranexamic acid 1300 mg 3 times daily.  Patient is also receiving Megace at 20 mg 4 times a day.  Dr. [name] said she would reach out to patient's gynecologist Dr. [name] will discuss with patient later today. Recent Labs    04/16/19 0952 05/10/19 1408 05/20/19 1543 09/23/19 1501 10/05/19 0421 10/05/19 2040 10/06/19 0709 10/07/19 0452 10/08/19 0355  HGB 9.3* 10.7* 10.6* 9.7* 6.7* 7.8* 8.4* 8.1* 9.4*   Chronic iron deficiency anemia -Ferritin on admission was low at 31 despite Covid positive status. -Iron saturation low at 5% only. -8/29, 1 dose of IV Feraheme was given. -We will consider oral iron at discharge. Recent Labs    10/06/19 0709 10/06/19 1043 10/07/19 0452 10/08/19 0355  VITAMINB12  --  706  --   --   FOLATE  --  22.3  --   --   FERRITIN   < >  --  88 295  TIBC  --  265  --   --   IRON  --  13*  --   --   RETICCTPCT  --  2.6  --   --    < > = values in this interval not displayed.   COVID-19 virus positive -Her presenting symptoms of shortness of breath is secondary to anemia.  She does not have any symptoms related to Covid infection.  She is fully vaccinated.   -She is not requiring any active treatment for Covid.  However she needs to be in isolation for a duration of 2 weeks from the day of 1st positivity  Mobility: Encourage  ambulation inside the room. monitor for recurrence of dizziness. Code Status:   Code Status: Full Code  Nutritional status: Body mass index is 21.46 kg/m.     Diet Order            Diet regular Room service appropriate? Yes; Fluid consistency: Thin  Diet effective now                 DVT prophylaxis: SCDs Start: 10/05/19 0849   Antimicrobials:  None Fluid: None  Consultants: GYN on the phone Dr. [name] Family Communication:  None  Status is: Inpatient  Remains inpatient appropriate because:Hemodynamically unstable, Ongoing diagnostic testing needed not appropriate for outpatient work up and IV treatments appropriate due  to intensity of illness or inability to take PO   Dispo: The patient is from: Home              Anticipated d/c is to: Home              Anticipated d/c date is: 1 day              Patient currently is not medically stable to d/c.  Infusions:  . sodium chloride 10 mL/hr at 10/06/19 2145    Scheduled Meds: . vitamin C  500 mg Oral Daily  . megestrol  20 mg Oral QID  . tranexamic acid  1,300 mg Oral TID  . zinc sulfate  220 mg Oral Daily    Antimicrobials: Anti-infectives (From admission, onward)   None      PRN meds: acetaminophen, diphenhydrAMINE, traMADol-acetaminophen   Objective: Vitals:   10/07/19 2009 10/08/19 0522  BP: 109/90 96/65  Pulse: 86 79  Resp: 20 18  Temp: 98.5 F (36.9 C) 98.4 F (36.9 C)  SpO2: 99% 99%    Intake/Output Summary (Last 24 hours) at 10/08/2019 1156 Last data filed at 10/08/2019 0931 Gross per 24 hour  Intake 623 ml  Output --  Net 623 ml   Filed Weights   10/05/19 0131  Weight: 56.7 kg   Weight change:  Body mass index is 21.46 kg/m.   Physical Exam: General exam: Appears calm and comfortable.  Not in physical distress at rest Skin: No rashes, lesions or ulcers. HEENT: Atraumatic, normocephalic, supple neck, no obvious bleeding Lungs: Clear to auscultation bilaterally CVS: Regular rate and rhythm with no murmur GI/Abd soft, nontender, nondistended, bowel sound present CNS: Alert, awake, oriented x3 Psychiatry: Mood appropriate Extremities: No pedal edema, no calf tenderness  Data Review: I have personally reviewed the laboratory data and studies available.  Recent Labs  Lab 10/05/19 0421 10/05/19 2040 10/06/19 0709 10/07/19 0452 10/08/19 0355  WBC 8.4  --  15.0* 14.5* 13.5*  NEUTROABS 5.9  --   --   --   --   HGB 6.7* 7.8* 8.4* 8.1* 9.4*  HCT 22.1* 24.7* 25.9* 25.8* 29.7*  MCV 95.3  --  87.2 88.4 89.5  PLT 292  --  247 258 306   Recent Labs  Lab 10/05/19 0421 10/06/19 0709 10/07/19 0452 10/08/19 0355   NA 139 138 137 137  K 4.3 3.7 4.2 4.1  CL 106 109 109 109  CO2 25 22 20* 20*  GLUCOSE 101* 91 100* 100*  BUN 14 9 12 15   CREATININE 0.59 0.50 0.63 0.49  CALCIUM 8.5* 8.0* 8.6* 8.6*   Lab Results  Component Value Date   HGBA1C 4.8 03/29/2018       Component Value Date/Time   CHOL 190 04/16/2019  0952   TRIG 43 04/16/2019 0952   HDL 109 04/16/2019 0952   CHOLHDL 1.7 04/16/2019 0952   CHOLHDL 2 09/21/2016 1144   VLDL 11.2 09/21/2016 1144   LDLCALC 72 04/16/2019 0952   LDLDIRECT 59 11/28/2008 2147   LABVLDL 9 04/16/2019 0952   Signed, Terrilee Croak, MD [facility] Pager: [phone] (Secure Chat preferred). 10/08/2019

## 2019-10-08 NOTE — Progress Notes (Signed)
Received a call by GYN Dr. [name] this evening.   Apparently patient has seen different GYN providers in the past including Dr. [name].  But, he is not her most recent provider.  She was scheduled for surgery but canceled multiple times, unclear reason. Dr. [name] would not be seeing the patient inpatient but I gave him patient's hospital room number to talk to her directly.  He suggested me following medicines at discharge.  Patient can follow-up with him as an outpatient. Megace 20 mg 4 times a day for 2 weeks Transtat 1300 mg 3 times a day for 4 days.

## 2019-10-09 ENCOUNTER — Ambulatory Visit: Payer: No Typology Code available for payment source | Admitting: Hematology and Oncology

## 2019-10-09 ENCOUNTER — Encounter: Payer: Self-pay | Admitting: Obstetrics and Gynecology

## 2019-10-09 ENCOUNTER — Other Ambulatory Visit: Payer: No Typology Code available for payment source

## 2019-10-09 LAB — COMPREHENSIVE METABOLIC PANEL
ALT: 93 U/L — ABNORMAL HIGH (ref 0–44)
AST: 90 U/L — ABNORMAL HIGH (ref 15–41)
Albumin: 3 g/dL — ABNORMAL LOW (ref 3.5–5.0)
Alkaline Phosphatase: 42 U/L (ref 38–126)
Anion gap: 9 (ref 5–15)
BUN: 11 mg/dL (ref 6–20)
CO2: 21 mmol/L — ABNORMAL LOW (ref 22–32)
Calcium: 8.5 mg/dL — ABNORMAL LOW (ref 8.9–10.3)
Chloride: 105 mmol/L (ref 98–111)
Creatinine, Ser: 0.65 mg/dL (ref 0.44–1.00)
GFR calc Af Amer: >60 mL/min (ref >60)
GFR calc non Af Amer: >60 mL/min (ref >60)
Glucose, Bld: 94 mg/dL (ref 70–99)
Potassium: 4.2 mmol/L (ref 3.5–5.1)
Sodium: 135 mmol/L (ref 135–145)
Total Bilirubin: 0.3 mg/dL (ref 0.3–1.2)
Total Protein: 6.4 g/dL — ABNORMAL LOW (ref 6.5–8.1)

## 2019-10-09 LAB — CBC
HCT: 30.1 % — ABNORMAL LOW (ref 36.0–46.0)
Hemoglobin: 9.4 g/dL — ABNORMAL LOW (ref 12.0–15.0)
MCH: 28.6 pg (ref 26.0–34.0)
MCHC: 31.2 g/dL (ref 30.0–36.0)
MCV: 91.5 fL (ref 80.0–100.0)
Platelets: 332 10*3/uL (ref 150–400)
RBC: 3.29 MIL/uL — ABNORMAL LOW (ref 3.87–5.11)
RDW: 16.8 % — ABNORMAL HIGH (ref 11.5–15.5)
WBC: 11.9 10*3/uL — ABNORMAL HIGH (ref 4.0–10.5)
nRBC: 0 % (ref 0.0–0.2)

## 2019-10-09 LAB — FERRITIN: Ferritin: 493 ng/mL — ABNORMAL HIGH (ref 11–307)

## 2019-10-09 LAB — D-DIMER, QUANTITATIVE: D-Dimer, Quant: 0.37 ug/mL-FEU (ref 0.00–0.50)

## 2019-10-09 LAB — C-REACTIVE PROTEIN: CRP: 6.8 mg/dL — ABNORMAL HIGH (ref <1.0)

## 2019-10-09 MED ORDER — MEGESTROL ACETATE 20 MG PO TABS: 20.0000 mg | ORAL_TABLET | Freq: Four times a day (QID) | ORAL | 0 refills | Status: AC

## 2019-10-09 MED ORDER — TRANEXAMIC ACID 650 MG PO TABS
1300.0000 mg | ORAL_TABLET | Freq: Three times a day (TID) | ORAL | 0 refills | Status: AC
Start: 2019-10-09 — End: 2019-10-13

## 2019-10-09 MED ORDER — TRAMADOL-ACETAMINOPHEN 37.5-325 MG PO TABS
1.0000 | ORAL_TABLET | Freq: Four times a day (QID) | ORAL | 0 refills | Status: AC | PRN
Start: 2019-10-09 — End: 2019-10-14

## 2019-10-09 MED FILL — MEGESTROL 20 MG TABLET: 20 | 14 days supply | Qty: 56 | Fill #0

## 2019-10-09 MED FILL — TRAMADOL-ACETAMINOPHN 37.5-: 37.5-325 | 5 days supply | Qty: 20 | Fill #0

## 2019-10-09 MED FILL — TRANEXAMIC ACID 650 MG TABS: 650 | 4 days supply | Qty: 24 | Fill #0

## 2019-10-09 NOTE — Discharge Summary (Signed)
Physician Discharge Summary  Marissa Salazar:[id number] DOB: 1980-12-10 DOA: 10/05/2019  PCP: Lorrene Reid, PA-C  Admit date: 10/05/2019 Discharge date: 10/09/2019  Admitted From: Home Disposition: Home  Recommendations for Outpatient Follow-up:  1. Follow up with PCP in 1-2 weeks 2. Follow-up with hematology as scheduled 3. Follow-up with GYN, Dr. [name] in 1-2 weeks 4. Continue Tranexamic acid 1300 mg 3 times daily x4 days 5. Continue Megace 20 mg 4 times daily x2 weeks 6. Please obtain CMP/CBC in one week  Home Health: No Equipment/Devices: None  Discharge Condition: Stable CODE STATUS: Full code Diet recommendation: Regular diet  History of present illness:  Marissa Salazar is a 39 y.o. female with PMH of uterine fibroids, chronic menstrual bleeds requiring multiple blood transfusion, iron deficiency anemia, and recent exposure to COVID-19 who presented to the ED on 10/05/2019 with 1 week history of heavy menstrual bleeding, requiring up to a box of tampons per day.  Associated with progressively worsening shortness of breath, dizziness, generalized weakness. Patient has known history of uterine fibroids and was scheduled to have surgery by GYN Dr. [name] however put on hold secondary to Covid positive status. Patient is fully vaccinated against COVID-19.    In ED, hemoglobin 6.7,Covid PCR positive again. Chest x-ray did not show any active disease. O2 sats 100% on room air.  Patient was admitted primarily for active blood loss anemia  Hospital course:  Symptomatic acute blood loss anemia  Chronicanemiadue to menorrhagia, uterine fibroids Patient presenting to the ED with 1 week history of progressive heavy menstrual bleeding requiring up to 1 box of tampons per day.  Patient with known uterine fibroids follows with gynecology outpatient.  Patient was noted to have a hemoglobin of 6.7 on arrival.  GYN was consulted on admission, Dr. [name] who recommended initiation of  Megace and transfusion.  Patient was transfused 4 units total of PRBCs during hospitalization.  After further discussion with GYN, Dr. [name], recommended 4-day course of Tranexamic acid 1300mg  TID and continue Megace 20 mg 4 times daily.  Patient's hemoglobin remained stable over the past 2 days at 9.4 following transfusion.  Patient symptoms of dizziness and weakness have resolved.  Patient will continue this medication regimen on discharge and needs close outpatient follow-up with gynecology for definitive treatment.  Chronic iron deficiency anemia Iron 13, TIBC 265, ferritin 88, folate 22.3, B12 706, iron saturation 5%.  Patient was given 1 dose IV Feraheme on 10/06/2019 and transfused with 4 unit PRBC as above.  Hemoglobin 9.4 at time of discharge.  Recommend repeat CBC in 1 week at PCP/specialist visit.  Resume home oral iron supplementation.  COVID-19 virus positive Her presenting symptoms of shortness of breath is secondary to anemia.  She does not have any symptoms related to Covid infection.  She is fully vaccinated.  She is not requiring any active treatment for Covid.  However she needs to be in isolation for a duration of 2 weeks from the day of 1st positivity which was on 10/05/2019.   Discharge Diagnoses:  Principal Problem:   Symptomatic anemia Active Problems:   Iron deficiency anemia   Menorrhagia   History of uterine fibroid   Lab test positive for detection of COVID-19 virus    Discharge Instructions  Discharge Instructions    Call MD for:  difficulty breathing, headache or visual disturbances   Complete by: As directed    Call MD for:  extreme fatigue   Complete by: As directed  Call MD for:  persistant dizziness or light-headedness   Complete by: As directed    Call MD for:  persistant nausea and vomiting   Complete by: As directed    Call MD for:  severe uncontrolled pain   Complete by: As directed    Call MD for:  temperature >100.4   Complete by: As directed     Diet - low sodium heart healthy   Complete by: As directed    Increase activity slowly   Complete by: As directed      Allergies as of 10/09/2019      Reactions   Ivp Dye [iodinated Diagnostic Agents] Hives      Medication List    TAKE these medications   ferrous sulfate 325 (65 FE) MG tablet TAKE 1 TABLET BY MOUTH EVERY DAY WITH BREAKFAST What changed: See the new instructions.   fluticasone 50 MCG/ACT nasal spray Commonly known as: FLONASE Place 1 spray into both nostrils daily as needed for allergies or rhinitis.   megestrol 20 MG tablet Commonly known as: MEGACE Take 1 tablet (20 mg total) by mouth 4 (four) times daily for 14 days.   multivitamin tablet Take 1 tablet by mouth daily.   traMADol-acetaminophen 37.5-325 MG tablet Commonly known as: ULTRACET Take 1 tablet by mouth every 6 (six) hours as needed for up to 5 days for moderate pain.   tranexamic acid 650 MG Tabs tablet Commonly known as: LYSTEDA Take 2 tablets (1,300 mg total) by mouth 3 (three) times daily for 4 days. What changed: See the new instructions.       Follow-up Information    [name], MD. Schedule an appointment as soon as possible for a visit in 2 week(s).   Specialty: Obstetrics and Gynecology Contact information: [address], [address] [place] [address] [phone]              Allergies  Allergen Reactions  . Ivp Dye [Iodinated Diagnostic Agents] Hives    Consultations:  GYN, Dr. [name], Dr. [name]   Procedures/Studies: DG Chest Portable 1 View  Result Date: 10/05/2019 CLINICAL DATA:  COVID positive cough EXAM: PORTABLE CHEST 1 VIEW COMPARISON:  02/06/2013 FINDINGS: The heart size and mediastinal contours are within normal limits. Both lungs are clear. The visualized skeletal structures are unremarkable. IMPRESSION: No active disease. Electronically Signed   By: Donavan Foil M.D.   On: 10/05/2019 02:45      Subjective: Patient seen and examined  bedside, resting comfortably.  No complaints this morning.  Dizziness and fatigue have improved.  Hemoglobin remained stable over the past 2 days at 9.4.  Vaginal bleeding improved.  Ready for discharge home.  Instructed to follow-up with GYN next 1-2 weeks.  Denies headache, no dizziness, no fever/chills/night sweats, no nausea/vomiting/diarrhea, no chest pain, no palpitations, no shortness of breath, no cough/congestion, no abdominal pain, no weakness, no fatigue, no paresthesias.  No acute events overnight per nursing staff.  Discharge Exam: Vitals:   10/08/19 1949 10/09/19 0355  BP: 102/60 (!) 106/58  Pulse: 72 64  Resp: 15 15  Temp: 98 F (36.7 C) 98 F (36.7 C)  SpO2: 100% 100%   Vitals:   10/08/19 0522 10/08/19 1403 10/08/19 1949 10/09/19 0355  BP: 96/65 100/62 102/60 (!) 106/58  Pulse: 79 72 72 64  Resp: 18 15 15 15   Temp: 98.4 F (36.9 C) 98.7 F (37.1 C) 98 F (36.7 C) 98 F (36.7 C)  TempSrc: Oral Oral Oral  Oral  SpO2: 99% 99% 100% 100%  Weight:      Height:        General: Pt is alert, awake, not in acute distress Cardiovascular: RRR, S1/S2 +, no rubs, no gallops Respiratory: CTA bilaterally, no wheezing, no rhonchi Abdominal: Soft, NT, ND, bowel sounds + Extremities: no edema, no cyanosis    The results of significant diagnostics from this hospitalization (including imaging, microbiology, ancillary and laboratory) are listed below for reference.     Microbiology: Recent Results (from the past 240 hour(s))  SARS Coronavirus 2 by RT PCR (hospital order, performed in [facility] hospital lab) Nasopharyngeal Nasopharyngeal Swab     Status: Abnormal   Collection Time: 10/05/19  2:49 AM   Specimen: Nasopharyngeal Swab  Result Value Ref Range Status   SARS Coronavirus 2 POSITIVE (A) NEGATIVE Final    Comment: CRITICAL RESULT CALLED TO, READ BACK BY AND VERIFIED WITH: DOSTER,S.,RN @ 0407 10/05/19 TURNER,S. (NOTE) SARS-CoV-2 target nucleic acids are  DETECTED  SARS-CoV-2 RNA is generally detectable in upper respiratory specimens  during the acute phase of infection.  Positive results are indicative  of the presence of the identified virus, but do not rule out bacterial infection or co-infection with other pathogens not detected by the test.  Clinical correlation with patient history and  other diagnostic information is necessary to determine patient infection status.  The expected result is negative.  Fact Sheet for Patients:   StrictlyIdeas.no   Fact Sheet for Healthcare Providers:   BankingDealers.co.za    This test is not yet approved or cleared by the [place] FDA and  has been authorized for detection and/or diagnosis of SARS-CoV-2 by FDA under an Emergency Use Authorization (EUA).  This EUA will remain in effect (mea ning this test can be used) for the duration of  the COVID-19 declaration under Section 564(b)(1) of the Act, 21 U.S.C. section 360-bbb-3(b)(1), unless the authorization is terminated or revoked sooner.  Performed at [facility] [place] [address] [place], [place] [address]      Labs: BNP (last 3 results) No results for input(s): BNP in the last 8760 hours. Basic Metabolic Panel: Recent Labs  Lab 10/05/19 0421 10/06/19 0709 10/07/19 0452 10/08/19 0355 10/09/19 0402  NA 139 138 137 137 135  K 4.3 3.7 4.2 4.1 4.2  CL 106 109 109 109 105  CO2 25 22 20* 20* 21*  GLUCOSE 101* 91 100* 100* 94  BUN 14 9 12 15 11   CREATININE 0.59 0.50 0.63 0.49 0.65  CALCIUM 8.5* 8.0* 8.6* 8.6* 8.5*   Liver Function Tests: Recent Labs  Lab 10/05/19 0421 10/06/19 0709 10/07/19 0452 10/08/19 0355 10/09/19 0402  AST 30 27 21  67* 90*  ALT 24 17 18  44 93*  ALKPHOS 32* 30* 34* 39 42  BILITOT 0.3 0.4 0.4 0.5 0.3  PROT 6.4* 5.8* 5.4* 5.8* 6.4*  ALBUMIN 3.4* 2.9* 2.7* 2.7* 3.0*   No results for input(s): LIPASE, AMYLASE in the last 168 hours. No  results for input(s): AMMONIA in the last 168 hours. CBC: Recent Labs  Lab 10/05/19 0421 10/05/19 0421 10/05/19 2040 10/06/19 0709 10/07/19 0452 10/08/19 0355 10/09/19 0402  WBC 8.4  --   --  15.0* 14.5* 13.5* 11.9*  NEUTROABS 5.9  --   --   --   --   --   --   HGB 6.7*   < > 7.8* 8.4* 8.1* 9.4* 9.4*  HCT 22.1*   < > 24.7*  25.9* 25.8* 29.7* 30.1*  MCV 95.3  --   --  87.2 88.4 89.5 91.5  PLT 292  --   --  247 258 306 332   < > = values in this interval not displayed.   Cardiac Enzymes: No results for input(s): CKTOTAL, CKMB, CKMBINDEX, TROPONINI in the last 168 hours. BNP: Invalid input(s): POCBNP CBG: No results for input(s): GLUCAP in the last 168 hours. D-Dimer Recent Labs    10/08/19 0355 10/09/19 0402  DDIMER 0.40 0.37   Hgb A1c No results for input(s): HGBA1C in the last 72 hours. Lipid Profile No results for input(s): CHOL, HDL, LDLCALC, TRIG, CHOLHDL, LDLDIRECT in the last 72 hours. Thyroid function studies No results for input(s): TSH, T4TOTAL, T3FREE, THYROIDAB in the last 72 hours.  Invalid input(s): FREET3 Anemia work up Recent Labs    10/06/19 1043 10/07/19 0452 10/08/19 0355 10/09/19 0402  VITAMINB12 706  --   --   --   FOLATE 22.3  --   --   --   FERRITIN  --    < > 295 493*  TIBC 265  --   --   --   IRON 13*  --   --   --   RETICCTPCT 2.6  --   --   --    < > = values in this interval not displayed.   Urinalysis    Component Value Date/Time   COLORURINE STRAW (A) 07/15/2015 1204   APPEARANCEUR CLEAR (A) 07/15/2015 1204   LABSPEC 1.009 07/15/2015 1204   PHURINE 7.0 07/15/2015 1204   GLUCOSEU NEGATIVE 07/15/2015 1204   HGBUR NEGATIVE 07/15/2015 1204   HGBUR negative 02/02/2009 1408   BILIRUBINUR neg 05/05/2016 0851   KETONESUR NEGATIVE 07/15/2015 1204   PROTEINUR neg 05/05/2016 0851   PROTEINUR NEGATIVE 07/15/2015 1204   UROBILINOGEN 0.2 05/05/2016 0851   UROBILINOGEN 0.2 02/02/2009 1408   NITRITE neg 05/05/2016 0851   NITRITE  NEGATIVE 07/15/2015 1204   LEUKOCYTESUR Trace (A) 05/05/2016 0851   Sepsis Labs Invalid input(s): PROCALCITONIN,  WBC,  LACTICIDVEN Microbiology Recent Results (from the past 240 hour(s))  SARS Coronavirus 2 by RT PCR (hospital order, performed in [place] hospital lab) Nasopharyngeal Nasopharyngeal Swab     Status: Abnormal   Collection Time: 10/05/19  2:49 AM   Specimen: Nasopharyngeal Swab  Result Value Ref Range Status   SARS Coronavirus 2 POSITIVE (A) NEGATIVE Final    Comment: CRITICAL RESULT CALLED TO, READ BACK BY AND VERIFIED WITH: DOSTER,S.,RN @ 0407 10/05/19 TURNER,S. (NOTE) SARS-CoV-2 target nucleic acids are DETECTED  SARS-CoV-2 RNA is generally detectable in upper respiratory specimens  during the acute phase of infection.  Positive results are indicative  of the presence of the identified virus, but do not rule out bacterial infection or co-infection with other pathogens not detected by the test.  Clinical correlation with patient history and  other diagnostic information is necessary to determine patient infection status.  The expected result is negative.  Fact Sheet for Patients:   StrictlyIdeas.no   Fact Sheet for Healthcare Providers:   BankingDealers.co.za    This test is not yet approved or cleared by the [place] FDA and  has been authorized for detection and/or diagnosis of SARS-CoV-2 by FDA under an Emergency Use Authorization (EUA).  This EUA will remain in effect (mea ning this test can be used) for the duration of  the COVID-19 declaration under Section 564(b)(1) of the Act, 21 U.S.C. section 360-bbb-3(b)(1), unless the  authorization is terminated or revoked sooner.  Performed at [facility], [place] [address], [place], [place] [address]      Time coordinating discharge: Over 30 minutes  SIGNED:   Fannye Myer J [place] DO  [facility] 10/09/2019, 10:30 AM

## 2019-10-09 NOTE — Progress Notes (Signed)
  Pt is being discharged home today. Discharge instructions including medications and follow up appointments given. Pt had no further questions at this time. 

## 2019-10-09 NOTE — Discharge Instructions (Signed)
10 Things You Can Do to Manage Your COVID-19 Symptoms at Home If you have possible or confirmed COVID-19: 1. Stay home from work and school. And stay away from other public places. If you must go out, avoid using any kind of public transportation, ridesharing, or taxis. 2. Monitor your symptoms carefully. If your symptoms get worse, call your healthcare provider immediately. 3. Get rest and stay hydrated. 4. If you have a medical appointment, call the healthcare provider ahead of time and tell them that you have or may have COVID-19. 5. For medical emergencies, call 911 and notify the dispatch personnel that you have or may have COVID-19. 6. Cover your cough and sneezes with a tissue or use the inside of your elbow. 7. Wash your hands often with soap and water for at least 20 seconds or clean your hands with an alcohol-based hand sanitizer that contains at least 60% alcohol. 8. As much as possible, stay in a specific room and away from other people in your home. Also, you should use a separate bathroom, if available. If you need to be around other people in or outside of the home, wear a mask. 9. Avoid sharing personal items with other people in your household, like dishes, towels, and bedding. 10. Clean all surfaces that are touched often, like counters, tabletops, and doorknobs. Use household cleaning sprays or wipes according to the label instructions. [email or web address] 08/08/2018 This information is not intended to replace advice given to you by your health care provider. Make sure you discuss any questions you have with your health care provider. Document Revised: 01/10/2019 Document Reviewed: 01/10/2019 Elsevier Patient Education  2020 [place].   COVID-19 COVID-19 is a respiratory infection that is caused by a virus called severe acute respiratory syndrome coronavirus 2 (SARS-CoV-2). The disease is also known as coronavirus disease or novel coronavirus. In some people, the virus  may not cause any symptoms. In others, it may cause a serious infection. The infection can get worse quickly and can lead to complications, such as:  Pneumonia, or infection of the lungs.  Acute respiratory distress syndrome or ARDS. This is a condition in which fluid build-up in the lungs prevents the lungs from filling with air and passing oxygen into the blood.  Acute respiratory failure. This is a condition in which there is not enough oxygen passing from the lungs to the body or when carbon dioxide is not passing from the lungs out of the body.  Sepsis or septic shock. This is a serious bodily reaction to an infection.  Blood clotting problems.  Secondary infections due to bacteria or fungus.  Organ failure. This is when your body's organs stop working. The virus that causes COVID-19 is contagious. This means that it can spread from person to person through droplets from coughs and sneezes (respiratory secretions). What are the causes? This illness is caused by a virus. You may catch the virus by:  Breathing in droplets from an infected person. Droplets can be spread by a person breathing, speaking, singing, coughing, or sneezing.  Touching something, like a table or a doorknob, that was exposed to the virus (contaminated) and then touching your mouth, nose, or eyes. What increases the risk? Risk for infection You are more likely to be infected with this virus if you:  Are within 6 feet (2 meters) of a person with COVID-19.  Provide care for or live with a person who is infected with COVID-19.  Spend time in crowded indoor spaces or  live in shared housing. Risk for serious illness You are more likely to become seriously ill from the virus if you:  Are 11 years of age or older. The higher your age, the more you are at risk for serious illness.  Live in a nursing home or long-term care facility.  Have cancer.  Have a long-term (chronic) disease such as: ? Chronic lung  disease, including chronic obstructive pulmonary disease or asthma. ? A long-term disease that lowers your body's ability to fight infection (immunocompromised). ? Heart disease, including heart failure, a condition in which the arteries that lead to the heart become narrow or blocked (coronary artery disease), a disease which makes the heart muscle thick, weak, or stiff (cardiomyopathy). ? Diabetes. ? Chronic kidney disease. ? Sickle cell disease, a condition in which red blood cells have an abnormal "sickle" shape. ? Liver disease.  Are obese. What are the signs or symptoms? Symptoms of this condition can range from mild to severe. Symptoms may appear any time from 2 to 14 days after being exposed to the virus. They include:  A fever or chills.  A cough.  Difficulty breathing.  Headaches, body aches, or muscle aches.  Runny or stuffy (congested) nose.  A sore throat.  New loss of taste or smell. Some people may also have stomach problems, such as nausea, vomiting, or diarrhea. Other people may not have any symptoms of COVID-19. How is this diagnosed? This condition may be diagnosed based on:  Your signs and symptoms, especially if: ? You live in an area with a COVID-19 outbreak. ? You recently traveled to or from an area where the virus is common. ? You provide care for or live with a person who was diagnosed with COVID-19. ? You were exposed to a person who was diagnosed with COVID-19.  A physical exam.  Lab tests, which may include: ? Taking a sample of fluid from the back of your nose and throat (nasopharyngeal fluid), your nose, or your throat using a swab. ? A sample of mucus from your lungs (sputum). ? Blood tests.  Imaging tests, which may include, X-rays, CT scan, or ultrasound. How is this treated? At present, there is no medicine to treat COVID-19. Medicines that treat other diseases are being used on a trial basis to see if they are effective against  COVID-19. Your health care provider will talk with you about ways to treat your symptoms. For most people, the infection is mild and can be managed at home with rest, fluids, and over-the-counter medicines. Treatment for a serious infection usually takes places in a hospital intensive care unit (ICU). It may include one or more of the following treatments. These treatments are given until your symptoms improve.  Receiving fluids and medicines through an IV.  Supplemental oxygen. Extra oxygen is given through a tube in the nose, a face mask, or a hood.  Positioning you to lie on your stomach (prone position). This makes it easier for oxygen to get into the lungs.  Continuous positive airway pressure (CPAP) or bi-level positive airway pressure (BPAP) machine. This treatment uses mild air pressure to keep the airways open. A tube that is connected to a motor delivers oxygen to the body.  Ventilator. This treatment moves air into and out of the lungs by using a tube that is placed in your windpipe.  Tracheostomy. This is a procedure to create a hole in the neck so that a breathing tube can be inserted.  Extracorporeal membrane  oxygenation (ECMO). This procedure gives the lungs a chance to recover by taking over the functions of the heart and lungs. It supplies oxygen to the body and removes carbon dioxide. Follow these instructions at home: Lifestyle  If you are sick, stay home except to get medical care. Your health care provider will tell you how long to stay home. Call your health care provider before you go for medical care.  Rest at home as told by your health care provider.  Do not use any products that contain nicotine or tobacco, such as cigarettes, e-cigarettes, and chewing tobacco. If you need help quitting, ask your health care provider.  Return to your normal activities as told by your health care provider. Ask your health care provider what activities are safe for you. General  instructions  Take over-the-counter and prescription medicines only as told by your health care provider.  Drink enough fluid to keep your urine pale yellow.  Keep all follow-up visits as told by your health care provider. This is important. How is this prevented?  There is no vaccine to help prevent COVID-19 infection. However, there are steps you can take to protect yourself and others from this virus. To protect yourself:   Do not travel to areas where COVID-19 is a risk. The areas where COVID-19 is reported change often. To identify high-risk areas and travel restrictions, check the CDC travel website: wwwnc.cdc.gov/travel/notices  If you live in, or must travel to, an area where COVID-19 is a risk, take precautions to avoid infection. ? Stay away from people who are sick. ? Wash your hands often with soap and water for 20 seconds. If soap and water are not available, use an alcohol-based hand sanitizer. ? Avoid touching your mouth, face, eyes, or nose. ? Avoid going out in public, follow guidance from your state and local health authorities. ? If you must go out in public, wear a cloth face covering or face mask. Make sure your mask covers your nose and mouth. ? Avoid crowded indoor spaces. Stay at least 6 feet (2 meters) away from others. ? Disinfect objects and surfaces that are frequently touched every day. This may include:  Counters and tables.  Doorknobs and light switches.  Sinks and faucets.  Electronics, such as phones, remote controls, keyboards, computers, and tablets. To protect others: If you have symptoms of COVID-19, take steps to prevent the virus from spreading to others.  If you think you have a COVID-19 infection, contact your health care provider right away. Tell your health care team that you think you may have a COVID-19 infection.  Stay home. Leave your house only to seek medical care. Do not use public transport.  Do not travel while you are  sick.  Wash your hands often with soap and water for 20 seconds. If soap and water are not available, use alcohol-based hand sanitizer.  Stay away from other members of your household. Let healthy household members care for children and pets, if possible. If you have to care for children or pets, wash your hands often and wear a mask. If possible, stay in your own room, separate from others. Use a different bathroom.  Make sure that all people in your household wash their hands well and often.  Cough or sneeze into a tissue or your sleeve or elbow. Do not cough or sneeze into your hand or into the air.  Wear a cloth face covering or face mask. Make sure your mask covers your nose   and mouth. Where to find more information  Centers for Disease Control and Prevention: PurpleGadgets.be  World Health Organization: https://www.castaneda.info/ Contact a health care provider if:  You live in or have traveled to an area where COVID-19 is a risk and you have symptoms of the infection.  You have had contact with someone who has COVID-19 and you have symptoms of the infection. Get help right away if:  You have trouble breathing.  You have pain or pressure in your chest.  You have confusion.  You have bluish lips and fingernails.  You have difficulty waking from sleep.  You have symptoms that get worse. These symptoms may represent a serious problem that is an emergency. Do not wait to see if the symptoms will go away. Get medical help right away. Call your local emergency services (911 in the [place] Do not drive yourself to the hospital. Let the emergency medical personnel know if you think you have COVID-19. Summary  COVID-19 is a respiratory infection that is caused by a virus. It is also known as coronavirus disease or novel coronavirus. It can cause serious infections, such as pneumonia, acute respiratory distress syndrome, acute respiratory failure,  or sepsis.  The virus that causes COVID-19 is contagious. This means that it can spread from person to person through droplets from breathing, speaking, singing, coughing, or sneezing.  You are more likely to develop a serious illness if you are 58 years of age or older, have a weak immune system, live in a nursing home, or have chronic disease.  There is no medicine to treat COVID-19. Your health care provider will talk with you about ways to treat your symptoms.  Take steps to protect yourself and others from infection. Wash your hands often and disinfect objects and surfaces that are frequently touched every day. Stay away from people who are sick and wear a mask if you are sick. This information is not intended to replace advice given to you by your health care provider. Make sure you discuss any questions you have with your health care provider. Document Revised: 11/23/2018 Document Reviewed: 03/01/2018 Elsevier Patient Education  [place] [facility] Monitoring Name: Marissa Salazar  Location: [address] [place] [address]   Infection Prevention Recommendations for Individuals Confirmed to have, or Being Evaluated for, 2019 Novel Coronavirus (COVID-19) Infection Who Receive Care at Home  Individuals who are confirmed to have, or are being evaluated for, COVID-19 should follow the prevention steps below until a healthcare provider or local or state health department says they can return to normal activities.  Stay home except to get medical care You should restrict activities outside your home, except for getting medical care. Do not go to work, school, or public areas, and do not use public transportation or taxis.  Call ahead before visiting your doctor Before your medical appointment, call the healthcare provider and tell them that you have, or are being evaluated for, COVID-19 infection. This will help the healthcare provider's office take steps to keep  other people from getting infected. Ask your healthcare provider to call the local or state health department.  Monitor your symptoms Seek prompt medical attention if your illness is worsening (e.g., difficulty breathing). Before going to your medical appointment, call the healthcare provider and tell them that you have, or are being evaluated for, COVID-19 infection. Ask your healthcare provider to call the local or state health department.  Wear a facemask You should wear a  facemask that covers your nose and mouth when you are in the same room with other people and when you visit a healthcare provider. People who live with or visit you should also wear a facemask while they are in the same room with you.  Separate yourself from other people in your home As much as possible, you should stay in a different room from other people in your home. Also, you should use a separate bathroom, if available.  Avoid sharing household items You should not share dishes, drinking glasses, cups, eating utensils, towels, bedding, or other items with other people in your home. After using these items, you should wash them thoroughly with soap and water.  Cover your coughs and sneezes Cover your mouth and nose with a tissue when you cough or sneeze, or you can cough or sneeze into your sleeve. Throw used tissues in a lined trash can, and immediately wash your hands with soap and water for at least 20 seconds or use an alcohol-based hand rub.  Wash your [employer] [employer] your hands often and thoroughly with soap and water for at least 20 seconds. You can use an alcohol-based hand sanitizer if soap and water are not available and if your hands are not visibly dirty. Avoid touching your eyes, nose, and mouth with unwashed hands.   Prevention Steps for Caregivers and Household Members of Individuals Confirmed to have, or Being Evaluated for, COVID-19 Infection Being Cared for in the Home  If you live with, or  provide care at home for, a person confirmed to have, or being evaluated for, COVID-19 infection please follow these guidelines to prevent infection:  Follow healthcare provider's instructions Make sure that you understand and can help the patient follow any healthcare provider instructions for all care.  Provide for the patient's basic needs You should help the patient with basic needs in the home and provide support for getting groceries, prescriptions, and other personal needs.  Monitor the patient's symptoms If they are getting sicker, call his or her medical provider and tell them that the patient has, or is being evaluated for, COVID-19 infection. This will help the healthcare provider's office take steps to keep other people from getting infected. Ask the healthcare provider to call the local or state health department.  Limit the number of people who have contact with the patient  If possible, have only one caregiver for the patient.  Other household members should stay in another home or place of residence. If this is not possible, they should stay  in another room, or be separated from the patient as much as possible. Use a separate bathroom, if available.  Restrict visitors who do not have an essential need to be in the home.  Keep older adults, very young children, and other sick people away from the patient Keep older adults, very young children, and those who have compromised immune systems or chronic health conditions away from the patient. This includes people with chronic heart, lung, or kidney conditions, diabetes, and cancer.  Ensure good ventilation Make sure that shared spaces in the home have good air flow, such as from an air conditioner or an opened window, weather permitting.  Wash your hands often  Wash your hands often and thoroughly with soap and water for at least 20 seconds. You can use an alcohol based hand sanitizer if soap and water are not available  and if your hands are not visibly dirty.  Avoid touching your eyes, nose, and mouth  with unwashed hands.  Use disposable paper towels to dry your hands. If not available, use dedicated cloth towels and replace them when they become wet.  Wear a facemask and gloves  Wear a disposable facemask at all times in the room and gloves when you touch or have contact with the patient's blood, body fluids, and/or secretions or excretions, such as sweat, saliva, sputum, nasal mucus, vomit, urine, or feces.  Ensure the mask fits over your nose and mouth tightly, and do not touch it during use.  Throw out disposable facemasks and gloves after using them. Do not reuse.  Wash your hands immediately after removing your facemask and gloves.  If your personal clothing becomes contaminated, carefully remove clothing and launder. Wash your hands after handling contaminated clothing.  Place all used disposable facemasks, gloves, and other waste in a lined container before disposing them with other household waste.  Remove gloves and wash your hands immediately after handling these items.  Do not share dishes, glasses, or other household items with the patient  Avoid sharing household items. You should not share dishes, drinking glasses, cups, eating utensils, towels, bedding, or other items with a patient who is confirmed to have, or being evaluated for, COVID-19 infection.  After the person uses these items, you should wash them thoroughly with soap and water.  Wash laundry thoroughly  Immediately remove and wash clothes or bedding that have blood, body fluids, and/or secretions or excretions, such as sweat, saliva, sputum, nasal mucus, vomit, urine, or feces, on them.  Wear gloves when handling laundry from the patient.  Read and follow directions on labels of laundry or clothing items and detergent. In general, wash and dry with the warmest temperatures recommended on the label.  Clean all areas the  individual has used often  Clean all touchable surfaces, such as counters, tabletops, doorknobs, bathroom fixtures, toilets, phones, keyboards, tablets, and bedside tables, every day. Also, clean any surfaces that may have blood, body fluids, and/or secretions or excretions on them.  Wear gloves when cleaning surfaces the patient has come in contact with.  Use a diluted bleach solution (e.g., dilute bleach with 1 part bleach and 10 parts water) or a household disinfectant with a label that says EPA-registered for coronaviruses. To make a bleach solution at home, add 1 tablespoon of bleach to 1 quart (4 cups) of water. For a larger supply, add  cup of bleach to 1 gallon (16 cups) of water.  Read labels of cleaning products and follow recommendations provided on product labels. Labels contain instructions for safe and effective use of the cleaning product including precautions you should take when applying the product, such as wearing gloves or eye protection and making sure you have good ventilation during use of the product.  Remove gloves and wash hands immediately after cleaning.  Monitor yourself for signs and symptoms of illness Caregivers and household members are considered close contacts, should monitor their health, and will be asked to limit movement outside of the home to the extent possible. Follow the monitoring steps for close contacts listed on the symptom monitoring form.   ? If you have additional questions, contact your local health department or call the epidemiologist on call at [phone] (available 24/7). ? This guidance is subject to change. For the most up-to-date guidance from [facility], please refer to their website: https://www.taylor.biz/: Quarantine vs. Isolation QUARANTINE keeps someone who was in close contact with someone who has COVID-19 away from others. If you had close  contact with a person who has  COVID-19  Stay home until 14 days after your last contact.  Check your temperature twice a day and watch for symptoms of COVID-19.  If possible, stay away from people who are at higher-risk for getting very sick from COVID-19. ISOLATION keeps someone who is sick or tested positive for COVID-19 without symptoms away from others, even in their own home. If you are sick and think or know you have COVID-19  Stay home until after ? At least 10 days since symptoms first appeared and ? At least 24 hours with no fever without fever-reducing medication and ? Symptoms have improved If you tested positive for COVID-19 but do not have symptoms  Stay home until after ? 10 days have passed since your positive test If you live with others, stay in a specific "sick room" or area and away from other people or animals, including pets. Use a separate bathroom, if available. [email or web address] 08/27/2018 This information is not intended to replace advice given to you by your health care provider. Make sure you discuss any questions you have with your health care provider. Document Revised: 01/10/2019 Document Reviewed: 01/10/2019 Elsevier Patient Education  [employer]. Menorrhagia  Menorrhagia is a condition in which menstrual periods are heavy or last longer than normal. With menorrhagia, most periods a woman has may cause enough blood loss and cramping that she becomes unable to take part in her usual activities. What are the causes? Common causes of this condition include:  Noncancerous growths in the uterus (polyps or fibroids).  An imbalance of the estrogen and progesterone hormones.  One of the ovaries not releasing an egg during one or more months.  A problem with the thyroid gland (hypothyroid).  Side effects of having an intrauterine device (IUD).  Side effects of some medicines, such as anti-inflammatory medicines or blood thinners.  A bleeding disorder that stops the blood  from clotting normally. In some cases, the cause of this condition is not known. What are the signs or symptoms? Symptoms of this condition include:  Routinely having to change your pad or tampon every 1-2 hours because it is completely soaked.  Needing to use pads and tampons at the same time because of heavy bleeding.  Needing to wake up to change your pads or tampons during the night.  Passing blood clots larger than 1 inch (2.5 cm) in size.  Having bleeding that lasts for more than 7 days.  Having symptoms of low iron levels (anemia), such as tiredness, fatigue, or shortness of breath. How is this diagnosed? This condition may be diagnosed based on:  A physical exam.  Your symptoms and menstrual history.  Tests, such as: ? Blood tests to check if you are pregnant or have hormonal changes, a bleeding or thyroid disorder, anemia, or other problems. ? Pap test to check for cancerous changes, infections, or inflammation. ? Endometrial biopsy. This test involves removing a tissue sample from the lining of the uterus (endometrium) to be examined under a microscope. ? Pelvic ultrasound. This test uses sound waves to create images of your uterus, ovaries, and vagina. The images can show if you have fibroids or other growths. ? Hysteroscopy. For this test, a small telescope is used to look inside your uterus. How is this treated? Treatment may not be needed for this condition. If it is needed, the best treatment for you will depend on:  Whether you need to prevent pregnancy.  Your desire to have  children in the future.  The cause and severity of your bleeding.  Your personal preference. Medicines are the first step in treatment. You may be treated with:  Hormonal birth control methods. These treatments reduce bleeding during your menstrual period. They include: ? Birth control pills. ? Skin patch. ? Vaginal ring. ? Shots (injections) that you get every 3 months. ? Hormonal  IUD (intrauterine device). ? Implants that go under the skin.  Medicines that thicken blood and slow bleeding.  Medicines that reduce swelling, such as ibuprofen.  Medicines that contain an artificial (synthetic) hormone called progestin.  Medicines that make the ovaries stop working for a short time.  Iron supplements to treat anemia. If medicines do not work, surgery may be done. Surgical options may include:  Dilation and curettage (D&C). In this procedure, your health care provider opens (dilates) your cervix and then scrapes or suctions tissue from the endometrium to reduce menstrual bleeding.  Operative hysteroscopy. In this procedure, a small tube with a light on the end (hysteroscope) is used to view your uterus and help remove polyps that may be causing heavy periods.  Endometrial ablation. This is when various techniques are used to permanently destroy your entire endometrium. After endometrial ablation, most women have little or no menstrual flow. This procedure reduces your ability to become pregnant.  Endometrial resection. In this procedure, an electrosurgical wire loop is used to remove the endometrium. This procedure reduces your ability to become pregnant.  Hysterectomy. This is surgical removal of the uterus. This is a permanent procedure that stops menstrual periods. Pregnancy is not possible after a hysterectomy. Follow these instructions at home: Medicines  Take over-the-counter and prescription medicines exactly as told by your health care provider. This includes iron pills.  Do not change or switch medicines without asking your health care provider.  Do not take aspirin or medicines that contain aspirin 1 week before or during your menstrual period. Aspirin may make bleeding worse. General instructions  If you need to change your sanitary pad or tampon more than once every 2 hours, limit your activity until the bleeding stops.  Iron pills can cause  constipation. To prevent or treat constipation while you are taking prescription iron supplements, your health care provider may recommend that you: ? Drink enough fluid to keep your urine clear or pale yellow. ? Take over-the-counter or prescription medicines. ? Eat foods that are high in fiber, such as fresh fruits and vegetables, whole grains, and beans. ? Limit foods that are high in fat and processed sugars, such as fried and sweet foods.  Eat well-balanced meals, including foods that are high in iron. Foods that have a lot of iron include leafy green vegetables, meat, liver, eggs, and whole grain breads and cereals.  Do not try to lose weight until the abnormal bleeding has stopped and your blood iron level is back to normal. If you need to lose weight, work with your health care provider to lose weight safely.  Keep all follow-up visits as told by your health care provider. This is important. Contact a health care provider if:  You soak through a pad or tampon every 1 or 2 hours, and this happens every time you have a period.  You need to use pads and tampons at the same time because you are bleeding so much.  You have nausea, vomiting, diarrhea, or other problems related to medicines you are taking. Get help right away if:  You soak through more than a  pad or tampon in 1 hour.  You pass clots bigger than 1 inch (2.5 cm) wide.  You feel short of breath.  You feel like your heart is beating too fast.  You feel dizzy or faint.  You feel very weak or tired. Summary  Menorrhagia is a condition in which menstrual periods are heavy or last longer than normal.  Treatment will depend on the cause of the condition and may include medicines or procedures.  Take over-the-counter and prescription medicines exactly as told by your health care provider. This includes iron pills.  Get help right away if you have heavy bleeding that soaks through more than a pad or tampon in 1 hour, you  are passing large clots, or you feel dizzy, faint or short of breath. This information is not intended to replace advice given to you by your health care provider. Make sure you discuss any questions you have with your health care provider. Document Revised: 05/03/2017 Document Reviewed: 01/18/2016 Elsevier Patient Education  [place]. Uterine Fibroids  Uterine fibroids are lumps of tissue (tumors) in your womb (uterus). They are not cancer (are benign). Most women with this condition do not need treatment. Sometimes fibroids can affect your ability to have children (your fertility). If that happens, you may need surgery to take out the fibroids. Follow these instructions at home:  Take over-the-counter and prescription medicines only as told by your doctor. Your doctor may suggest NSAIDs (such as aspirin or ibuprofen) to help with pain.  Ask your doctor if you should: ? Take iron pills. ? Eat more foods that have iron in them, such as dark green, leafy vegetables.  If directed, apply heat to your back or belly to reduce pain. Use the heat source that your doctor recommends, such as a moist heat pack or a heating pad. ? Put a towel between your skin and the heat source. ? Leave the heat on for 20-30 minutes. ? Remove the heat if your skin turns bright red. This is especially important if you are unable to feel pain, heat, or cold. You may have a greater risk of getting burned.  Pay close attention to your period (menstrual) cycles. Tell your doctor about any changes, such as: ? A heavier blood flow than usual. ? Needing to use more pads or tampons than normal. ? A change in how many days your period lasts. ? A change in symptoms that come with your period, such as cramps or back pain.  Keep all follow-up visits as told by your doctor. This is important. Your doctor may need to watch your fibroids over time for any changes. Contact a doctor if you:  Have pain that does not get  better with medicine or heat, such as pain or cramps in: ? Your back. ? The area between your hip bones (pelvic area). ? Your belly.  Have new bleeding between your periods.  Have more bleeding during or between your periods.  Feel very tired or weak.  Feel light-headed. Get help right away if you:  Pass out (faint).  Have pain in the area between your hip bones that suddenly gets worse.  Have bleeding that soaks a tampon or pad in 30 minutes or less. Summary  Uterine fibroids are lumps of tissue (tumors) in your womb (uterus). They are not cancer.  The only treatment that most women need is taking aspirin or ibuprofen for pain.  Contact a doctor if you have pain or cramps that do  not get better with medicine.  Make sure you know what symptoms you should get help for right away. This information is not intended to replace advice given to you by your health care provider. Make sure you discuss any questions you have with your health care provider. Document Revised: 01/06/2017 Document Reviewed: 12/20/2016 Elsevier Patient Education  2020 [employer].

## 2019-10-09 NOTE — Telephone Encounter (Signed)
Patient was admitted for Covid and symptomatic anemia.  She received care from [facility] during the hospitalization.  Her discharge summary indicates follow up with Dr. [name] at that office.  Patient choice for follow up location.

## 2019-10-10 ENCOUNTER — Encounter: Payer: Self-pay | Admitting: *Deleted

## 2019-10-10 ENCOUNTER — Other Ambulatory Visit: Payer: Self-pay | Admitting: *Deleted

## 2019-10-10 NOTE — Patient Outreach (Addendum)
[facility]) Care Management  10/10/2019  Marissa Salazar 1980/04/25 [id number]    Transition of care call/case closure   Referral received:10/07/19 Initial outreach:10/10/19 Insurance: [place] [employer]   Subjective: Initial successful telephone call to patient's preferred number in order to complete transition of care assessment; 2 HIPAA identifiers verified. Explained purpose of call and completed transition of care assessment.  Marissa Salazar states that she is still weak but getting there. She reports improvement in bleeding only noting spotting. She report tolerating diet without nausea. She reports occasional headache improved with use of Ultracet on last night and rested well.  Patient mother is available to   assist with her  recovery. She denies symptoms related to positive covid test.  She denies any ongoing health issues and says she does not need a referral to one of the [place] chronic disease management programs.  She believes that she does  have the hospital indemnity plan, provided [employer] contact number [phone] to file a claim if needed.  She uses a [facility] outpatient pharmacy at [employer], discussed option of possible mail order delivery.  She denies educational needs related to staying safe during the [place] 19 pandemic she understands that she has covid isolation end date.    Objective:   Marissa Salazar  was hospitalized at [facility] from 8/28-10/09/19 for Symptomatic Anemia , PRBC transfusion 4 untis, Menorrhagia, Positive for Covid 19,   Comorbidities include: Uterine Fibroid, chronic menstrual bleeds, blood transfusion, iron deficiency anemia .  She  was discharged to home on 10/09/19 without the need for home health services or DME.   Assessment:  Patient voices good understanding of all discharge instructions.  See transition of care flowsheet for assessment details.   Plan:  Reviewed hospital discharge diagnosis of Symptomatic Anemia,  Chronic menstrual bleeding, positive covid 19.    and discharge treatment plan using hospital discharge instructions, assessing medication adherence, reviewing problems requiring provider notification, and discussing the importance of follow up with surgeon, primary care provider and/or specialists as directed.  Reviewed  healthy lifestyle program information to receive discounted premium for  2022   Step 1: Get  your annual physical  Step 2: Complete your health assessment  Step 3:Identify your current health status and complete the corresponding action step between January 1, and October 09, 2019.     No ongoing care management needs identified so will close case to [facility] services and route successful outreach letter with [facility] pamphlet and 24 Hour Nurse Line [place] to [facility] clinical pool to be mailed to patient's home address.  Thanked patient for their services to [facility].  Joylene Draft, RN, BSN  [facility] Management Coordinator  [phone]- Mobile [phone]- Toll Free Main Office

## 2019-10-11 ENCOUNTER — Telehealth: Payer: Self-pay | Admitting: Hematology and Oncology

## 2019-10-11 NOTE — Telephone Encounter (Signed)
Rescheduled appointments per 9/3 scheduling message. Spoke with patient who is aware of scheduled appointments.

## 2019-10-16 NOTE — Telephone Encounter (Signed)
Call to patient, left detailed message, ok per dpr. Calling to f/u on recent hospital admission and f/u.  Please contact office to provide update.

## 2019-10-16 NOTE — Progress Notes (Signed)
Entered in error This encounter was created in error - please disregard. 

## 2019-10-17 ENCOUNTER — Inpatient Hospital Stay: Payer: No Typology Code available for payment source | Admitting: Hematology and Oncology

## 2019-10-17 ENCOUNTER — Other Ambulatory Visit: Payer: Self-pay

## 2019-10-17 ENCOUNTER — Inpatient Hospital Stay: Payer: No Typology Code available for payment source | Attending: Hematology and Oncology

## 2019-10-18 NOTE — Telephone Encounter (Signed)
Ok to close encounter and cancel orders for imaging.

## 2019-10-18 NOTE — Telephone Encounter (Signed)
No return call from patient.   Dr. Quincy Simmonds -ok to close encounter?

## 2019-10-21 NOTE — Telephone Encounter (Signed)
Orders cancelled for PUS and EMB.   Routing to [employer] and [employer].   Encounter closed.

## 2019-10-24 MED FILL — TRANEXAMIC ACID 650 MG TABS: 650 | 5 days supply | Qty: 30 | Fill #0

## 2019-10-30 ENCOUNTER — Other Ambulatory Visit: Payer: Self-pay | Admitting: Obstetrics and Gynecology

## 2019-10-30 HISTORY — PX: ABDOMINAL HYSTERECTOMY: SHX81

## 2019-10-31 MED FILL — IBUPROFEN 800 MG TAB: 800 | 10 days supply | Qty: 30 | Fill #0

## 2019-11-04 MED FILL — OXYCODONE-APAP 5-325MG: 5-325 | 3 days supply | Qty: 30 | Fill #0

## 2019-11-11 ENCOUNTER — Encounter: Payer: Self-pay | Admitting: Physician Assistant

## 2019-11-12 ENCOUNTER — Other Ambulatory Visit (INDEPENDENT_AMBULATORY_CARE_PROVIDER_SITE_OTHER): Payer: No Typology Code available for payment source

## 2019-11-12 ENCOUNTER — Other Ambulatory Visit: Payer: Self-pay

## 2019-11-12 VITALS — BP 91/48 | HR 76 | Ht 64.0 in | Wt 128.0 lb

## 2019-11-12 DIAGNOSIS — Z0000 Encounter for general adult medical examination without abnormal findings: Secondary | ICD-10-CM

## 2019-11-12 DIAGNOSIS — Z111 Encounter for screening for respiratory tuberculosis: Secondary | ICD-10-CM

## 2019-11-12 DIAGNOSIS — D500 Iron deficiency anemia secondary to blood loss (chronic): Secondary | ICD-10-CM | POA: Diagnosis not present

## 2019-11-12 DIAGNOSIS — Z23 Encounter for immunization: Secondary | ICD-10-CM | POA: Diagnosis not present

## 2019-11-12 NOTE — Progress Notes (Signed)
Patient tolerated injection well without complications.    VIS given.   Patient denies any allergies to eggs or previous flu vaccine. Patient denies fever or illness.

## 2019-11-13 LAB — HEPATIC FUNCTION PANEL: Bilirubin, Direct: <0.1 mg/dL (ref 0.00–0.40)

## 2019-11-13 LAB — COMPREHENSIVE METABOLIC PANEL
ALT: 20 IU/L (ref 0–32)
AST: 23 IU/L (ref 0–40)
Albumin/Globulin Ratio: 2 (ref 1.2–2.2)
Albumin: 4.5 g/dL (ref 3.8–4.8)
Alkaline Phosphatase: 43 IU/L — ABNORMAL LOW (ref 44–121)
BUN/Creatinine Ratio: 25 — ABNORMAL HIGH (ref 9–23)
BUN: 19 mg/dL (ref 6–20)
Bilirubin Total: 0.3 mg/dL (ref 0.0–1.2)
CO2: 24 mmol/L (ref 20–29)
Calcium: 9 mg/dL (ref 8.7–10.2)
Chloride: 102 mmol/L (ref 96–106)
Creatinine, Ser: 0.75 mg/dL (ref 0.57–1.00)
GFR calc Af Amer: 118 mL/min/{1.73_m2} (ref >59)
GFR calc non Af Amer: 102 mL/min/{1.73_m2} (ref >59)
Globulin, Total: 2.3 g/dL (ref 1.5–4.5)
Glucose: 85 mg/dL (ref 65–99)
Potassium: 4.7 mmol/L (ref 3.5–5.2)
Sodium: 139 mmol/L (ref 134–144)
Total Protein: 6.8 g/dL (ref 6.0–8.5)

## 2019-11-13 LAB — CBC
Hematocrit: 33.7 % — ABNORMAL LOW (ref 34.0–46.6)
Hemoglobin: 11 g/dL — ABNORMAL LOW (ref 11.1–15.9)
MCH: 29.2 pg (ref 26.6–33.0)
MCHC: 32.6 g/dL (ref 31.5–35.7)
MCV: 89 fL (ref 79–97)
Platelets: 368 10*3/uL (ref 150–450)
RBC: 3.77 x10E6/uL (ref 3.77–5.28)
RDW: 14.1 % (ref 11.7–15.4)
WBC: 7 10*3/uL (ref 3.4–10.8)

## 2019-11-14 ENCOUNTER — Inpatient Hospital Stay: Payer: No Typology Code available for payment source | Admitting: Hematology and Oncology

## 2019-11-14 ENCOUNTER — Telehealth: Payer: Self-pay | Admitting: Hematology and Oncology

## 2019-11-14 ENCOUNTER — Telehealth: Payer: Self-pay | Admitting: *Deleted

## 2019-11-14 ENCOUNTER — Inpatient Hospital Stay: Payer: No Typology Code available for payment source

## 2019-11-14 ENCOUNTER — Other Ambulatory Visit: Payer: Self-pay | Admitting: Hematology and Oncology

## 2019-11-14 DIAGNOSIS — D500 Iron deficiency anemia secondary to blood loss (chronic): Secondary | ICD-10-CM

## 2019-11-14 NOTE — Telephone Encounter (Signed)
Call made to patient as she cancelled her appts for today. Spoke with her. She states she had labs done with her PCP 2 days ago and wasn't sure if she needed additional lab work done. Advised that Dr. Lorenso Courier will want iron studies and Ferritin levels checked.  She sates she can come one day next week.  Message sent to Scheduling to re-schedule pt for next week-labs and MD visit.  Dr. Lorenso Courier made aware.

## 2019-11-14 NOTE — Progress Notes (Signed)
No show

## 2019-11-14 NOTE — Telephone Encounter (Signed)
R./s apt per 10/7 sch msg - left message for patient with new apt date and time

## 2019-11-14 NOTE — Telephone Encounter (Signed)
Per 10/7 sch message cancelled appointments called patient and left vm to call back and reschedule.

## 2019-11-15 LAB — POCT URINALYSIS DIPSTICK
Bilirubin, UA: NEGATIVE
Glucose, UA: NEGATIVE
Ketones, UA: 15
Leukocytes, UA: NEGATIVE
Nitrite, UA: NEGATIVE
Protein, UA: POSITIVE — AB
Spec Grav, UA: >=1.03 — AB (ref 1.010–1.025)
Urobilinogen, UA: 0.2 E.U./dL
pH, UA: 6 (ref 5.0–8.0)

## 2019-11-15 LAB — TB SKIN TEST
Induration: 0 mm
TB Skin Test: NEGATIVE

## 2019-11-15 NOTE — Addendum Note (Signed)
Addended by: Mickel Crow on: 11/15/2019 11:10 AM   Modules accepted: Orders

## 2019-11-20 NOTE — Progress Notes (Deleted)
[place] [phone]   [phone]  PROGRESS NOTE  Patient Care Team: Lorrene Reid, PA-C as PCP - General Christene Lye, MD as Consulting Physician (General Surgery) Requested, [name], [name], MD (General Surgery) Bobbye Charleston, MD as Consulting Physician (Obstetrics and Gynecology)  Hematological/Oncological History # Iron Deficiency Anemia 2/2 to GYN Bleeding 08/12/2015: WBC 7.2, Hgb 12.8, MCV 84.9, Plt 213 03/29/2018: WBC 7.4, Hgb 10.9, MCV 89, Plt 270 04/16/2019: WBC 8.0, Hgb 9.3, MCV 83, Plt 308. Iron 16, TIBC 409, ferritin 9, Iron sat 4% 05/10/2019: establish care with Dr. Lorenso Courier   Interval History:  Marissa Salazar 39 y.o. female with medical history significant for iron deficiency anemia 2/2 to GYN bleeding who presents for a follow up visit. The patient's last visit was on 05/10/2019 at which time she established care. In the interim since the last visit the patient missed a clinic visit due to a COVID infection.   MEDICAL HISTORY:  Past Medical History:  Diagnosis Date  . Abnormal Pap smear    in past; normal colpo  . Allergic rhinitis   . Allergy   . Anemia    Hx fibroids  . Blood transfusion without reported diagnosis 05/2019  . Family history of breast cancer   . Family history of colon cancer   . Family history of pancreatic cancer   . Fibroid   . Iron deficiency anemia due to chronic blood loss   . Superficial vein thrombosis 2019   Left leg  . Uterine fibroid 05/09/2019   sonohysterogram showing 7.8 x 7.4 cm fibroid with 4.6 cm in endometrial cavity - Dr. [name]    SURGICAL HISTORY: Past Surgical History:  Procedure Laterality Date  . BREAST BIOPSY Left 06-12-14   fibroadenoma  . BUNIONECTOMY Right 2013  . COLPOSCOPY  2009  . ESOPHAGOGASTRODUODENOSCOPY N/A 07/17/2015   Procedure: ESOPHAGOGASTRODUODENOSCOPY (EGD);  Surgeon: Manya Silvas, MD;  Location: [facility] ENDOSCOPY;  Service: Endoscopy;   Laterality: N/A;    SOCIAL HISTORY: Social History   Socioeconomic History  . Marital status: Married    Spouse name: Not on file  . Number of children: 1  . Years of education: Not on file  . Highest education level: Not on file  Occupational History  . Occupation: Ship broker  Tobacco Use  . Smoking status: Never Smoker  . Smokeless tobacco: Never Used  Vaping Use  . Vaping Use: Never used  Substance and Sexual Activity  . Alcohol use: Yes    Alcohol/week: 0.0 standard drinks    Comment: Rare  . Drug use: No  . Sexual activity: Not Currently    Birth control/protection: None  Other Topics Concern  . Not on file  Social History Narrative   Married      [name]; 59 y/o son      Ship broker; [employer] program      Regular exercise         Social Determinants of Health   Financial Resource Strain:   . Difficulty of Paying Living Expenses: Not on file  Food Insecurity:   . Worried About Charity fundraiser in the Last Year: Not on file  . Ran Out of Food in the Last Year: Not on file  Transportation Needs:   . Lack of Transportation (Medical): Not on file  . Lack of Transportation (Non-Medical): Not on file  Physical Activity:   . Days of Exercise per Week: Not on file  . Minutes of Exercise per  Session: Not on file  Stress:   . Feeling of Stress : Not on file  Social Connections:   . Frequency of Communication with Friends and Family: Not on file  . Frequency of Social Gatherings with Friends and Family: Not on file  . Attends Religious Services: Not on file  . Active Member of Clubs or Organizations: Not on file  . Attends Archivist Meetings: Not on file  . Marital Status: Not on file  Intimate Partner Violence:   . Fear of Current or Ex-Partner: Not on file  . Emotionally Abused: Not on file  . Physically Abused: Not on file  . Sexually Abused: Not on file    FAMILY HISTORY: Family History  Problem Relation Age of Onset  . Breast cancer Maternal Aunt 60        BRCA negative  . Colon cancer Maternal Grandmother 59  . Breast cancer Maternal Grandmother 13  . Hypertension Maternal Grandmother   . Breast cancer Other        GP  . Lung cancer Other        GP  . Diabetes Paternal Uncle   . Heart attack Maternal Uncle   . Pancreatic cancer Maternal Aunt 44    ALLERGIES:  is allergic to ivp dye [iodinated diagnostic agents].  MEDICATIONS:  Current Outpatient Medications  Medication Sig Dispense Refill  . ferrous sulfate 325 (65 FE) MG tablet TAKE 1 TABLET BY MOUTH EVERY DAY WITH BREAKFAST (Patient taking differently: Take 325 mg by mouth daily with breakfast. ) 30 tablet 2  . fluticasone (FLONASE) 50 MCG/ACT nasal spray Place 1 spray into both nostrils daily as needed for allergies or rhinitis.    . Multiple Vitamin (MULTIVITAMIN) tablet Take 1 tablet by mouth daily.     No current facility-administered medications for this visit.    REVIEW OF SYSTEMS:   Constitutional: ( - ) fevers, ( - )  chills , ( - ) night sweats Eyes: ( - ) blurriness of vision, ( - ) double vision, ( - ) watery eyes Ears, nose, mouth, throat, and face: ( - ) mucositis, ( - ) sore throat Respiratory: ( - ) cough, ( - ) dyspnea, ( - ) wheezes Cardiovascular: ( - ) palpitation, ( - ) chest discomfort, ( - ) lower extremity swelling Gastrointestinal:  ( - ) nausea, ( - ) heartburn, ( - ) change in bowel habits Skin: ( - ) abnormal skin rashes Lymphatics: ( - ) new lymphadenopathy, ( - ) easy bruising Neurological: ( - ) numbness, ( - ) tingling, ( - ) new weaknesses Behavioral/Psych: ( - ) mood change, ( - ) new changes  All other systems were reviewed with the patient and are negative.  PHYSICAL EXAMINATION: ECOG PERFORMANCE STATUS: {CHL ONC ECOG PS:[id number]}  There were no vitals filed for this visit. There were no vitals filed for this visit.  GENERAL: alert, no distress and comfortable SKIN: skin color, texture, turgor are normal, no rashes or  significant lesions EYES: conjunctiva are pink and non-injected, sclera clear OROPHARYNX: no exudate, no erythema; lips, buccal mucosa, and tongue normal  NECK: supple, non-tender LYMPH:  no palpable lymphadenopathy in the cervical, axillary or inguinal LUNGS: clear to auscultation and percussion with normal breathing effort HEART: regular rate & rhythm and no murmurs and no lower extremity edema ABDOMEN: soft, non-tender, non-distended, normal bowel sounds Musculoskeletal: no cyanosis of digits and no clubbing  PSYCH: alert & oriented x 3, fluent speech  NEURO: no focal motor/sensory deficits  LABORATORY DATA:  I have reviewed the data as listed CBC Latest Ref Rng & Units 11/12/2019 10/09/2019 10/08/2019  WBC 3.4 - 10.8 x10E3/uL 7.0 11.9(H) 13.5(H)  Hemoglobin 11.1 - 15.9 g/dL 11.0(L) 9.4(L) 9.4(L)  Hematocrit 34.0 - 46.6 % 33.7(L) 30.1(L) 29.7(L)  Platelets 150 - 450 x10E3/uL 368 332 306    CMP Latest Ref Rng & Units 11/12/2019 10/09/2019 10/08/2019  Glucose 65 - 99 mg/dL 85 94 100(H)  BUN 6 - 20 mg/dL 19 11 15   Creatinine 0.57 - 1.00 mg/dL 0.75 0.65 0.49  Sodium 134 - 144 mmol/L 139 135 137  Potassium 3.5 - 5.2 mmol/L 4.7 4.2 4.1  Chloride 96 - 106 mmol/L 102 105 109  CO2 20 - 29 mmol/L 24 21(L) 20(L)  Calcium 8.7 - 10.2 mg/dL 9.0 8.5(L) 8.6(L)  Total Protein 6.0 - 8.5 g/dL 6.8 6.4(L) 5.8(L)  Total Bilirubin 0.0 - 1.2 mg/dL 0.3 0.3 0.5  Alkaline Phos 44 - 121 IU/L 43(L) 42 39  AST 0 - 40 IU/L 23 90(H) 67(H)  ALT 0 - 32 IU/L 20 93(H) 44    No results found for: MPROTEIN No results found for: KPAFRELGTCHN, LAMBDASER, KAPLAMBRATIO   BLOOD FILM: *** Review of the peripheral blood smear showed normal appearing white cells with neutrophils that were appropriately lobated and granulated. There was no predominance of bi-lobed or hyper-segmented neutrophils appreciated. No Dohle bodies were noted. There was no left shifting, immature forms or blasts noted. Lymphocytes remain normal in size  without any predominance of large granular lymphocytes. Red cells show no anisopoikilocytosis, macrocytes , microcytes or polychromasia. There were no schistocytes, target cells, echinocytes, acanthocytes, dacrocytes, or stomatocytes.There was no rouleaux formation, nucleated red cells, or intra-cellular inclusions noted. The platelets are normal in size, shape, and color without any clumping evident.  RADIOGRAPHIC STUDIES: I have personally reviewed the radiological images as listed and agreed with the findings in the report. No results found.  ASSESSMENT & PLAN ***  # Iron Deficiency Anemia 2/2 to GYN Bleeding --today will order CBC, CMP, iron panel and ferritin --patient has been taking PO iron supplementation since at least 02/27/2018. Given her continued drop in Hgb it is clear that PO supplementation is inadequate to replete her ongoing iron loss --recommend the patient receive IV feraheme 541m q7 days x 2 doses to start as early as next week.  --strongly encourage patient to continue with further GYN evaluation.  --recommend the patient return to clinic 6 weeks after her last dose of IV iron to assure improvement in Hgb   #History of Superficial Thrombophlebitis --no current issues, occurred previously in 2019 --continue to monitor   No orders of the defined types were placed in this encounter.   All questions were answered. The patient knows to call the clinic with any problems, questions or concerns.  A total of more than 30 minutes were spent on this encounter and over half of that time was spent on counseling and coordination of care as outlined above.   JLedell Peoples MD Department of Hematology/Oncology [place]at WHigh[facility] HospitalPhone: [phone]: [phone]: [email or web address]  11/20/2019 7:29 PM

## 2019-11-21 ENCOUNTER — Other Ambulatory Visit: Payer: No Typology Code available for payment source

## 2019-11-21 ENCOUNTER — Inpatient Hospital Stay: Payer: No Typology Code available for payment source

## 2019-11-21 ENCOUNTER — Inpatient Hospital Stay: Payer: No Typology Code available for payment source | Admitting: Hematology and Oncology

## 2019-11-25 ENCOUNTER — Inpatient Hospital Stay (HOSPITAL_BASED_OUTPATIENT_CLINIC_OR_DEPARTMENT_OTHER): Payer: No Typology Code available for payment source | Admitting: Hematology and Oncology

## 2019-11-25 ENCOUNTER — Encounter: Payer: Self-pay | Admitting: Hematology and Oncology

## 2019-11-25 ENCOUNTER — Other Ambulatory Visit: Payer: Self-pay

## 2019-11-25 ENCOUNTER — Inpatient Hospital Stay: Payer: No Typology Code available for payment source | Attending: Hematology and Oncology

## 2019-11-25 VITALS — BP 113/81 | HR 73 | Temp 96.9°F | Resp 17 | Ht 64.0 in | Wt 131.2 lb

## 2019-11-25 DIAGNOSIS — Z8249 Family history of ischemic heart disease and other diseases of the circulatory system: Secondary | ICD-10-CM | POA: Diagnosis not present

## 2019-11-25 DIAGNOSIS — Z79899 Other long term (current) drug therapy: Secondary | ICD-10-CM | POA: Insufficient documentation

## 2019-11-25 DIAGNOSIS — Z801 Family history of malignant neoplasm of trachea, bronchus and lung: Secondary | ICD-10-CM | POA: Diagnosis not present

## 2019-11-25 DIAGNOSIS — Z833 Family history of diabetes mellitus: Secondary | ICD-10-CM | POA: Diagnosis not present

## 2019-11-25 DIAGNOSIS — Z803 Family history of malignant neoplasm of breast: Secondary | ICD-10-CM | POA: Diagnosis not present

## 2019-11-25 DIAGNOSIS — D500 Iron deficiency anemia secondary to blood loss (chronic): Secondary | ICD-10-CM

## 2019-11-25 DIAGNOSIS — N920 Excessive and frequent menstruation with regular cycle: Secondary | ICD-10-CM | POA: Diagnosis present

## 2019-11-25 LAB — FERRITIN: Ferritin: 96 ng/mL (ref 11–307)

## 2019-11-25 LAB — CBC WITH DIFFERENTIAL (CANCER CENTER ONLY)
Abs Immature Granulocytes: 0.07 10*3/uL (ref 0.00–0.07)
Basophils Absolute: 0 10*3/uL (ref 0.0–0.1)
Basophils Relative: 0 %
Eosinophils Absolute: 0.2 10*3/uL (ref 0.0–0.5)
Eosinophils Relative: 2 %
HCT: 34.6 % — ABNORMAL LOW (ref 36.0–46.0)
Hemoglobin: 11 g/dL — ABNORMAL LOW (ref 12.0–15.0)
Immature Granulocytes: 1 %
Lymphocytes Relative: 15 %
Lymphs Abs: 1.5 10*3/uL (ref 0.7–4.0)
MCH: 28.1 pg (ref 26.0–34.0)
MCHC: 31.8 g/dL (ref 30.0–36.0)
MCV: 88.5 fL (ref 80.0–100.0)
Monocytes Absolute: 0.4 10*3/uL (ref 0.1–1.0)
Monocytes Relative: 4 %
Neutro Abs: 8 10*3/uL — ABNORMAL HIGH (ref 1.7–7.7)
Neutrophils Relative %: 78 %
Platelet Count: 384 10*3/uL (ref 150–400)
RBC: 3.91 MIL/uL (ref 3.87–5.11)
RDW: 14.6 % (ref 11.5–15.5)
WBC Count: 10.1 10*3/uL (ref 4.0–10.5)
nRBC: 0 % (ref 0.0–0.2)

## 2019-11-25 LAB — RETIC PANEL
Immature Retic Fract: 14.5 % (ref 2.3–15.9)
RBC.: 3.93 MIL/uL (ref 3.87–5.11)
Retic Count, Absolute: 84.5 10*3/uL (ref 19.0–186.0)
Retic Ct Pct: 2.2 % (ref 0.4–3.1)
Reticulocyte Hemoglobin: 34.4 pg (ref >27.9)

## 2019-11-25 LAB — CMP (CANCER CENTER ONLY)
ALT: 15 U/L (ref 0–44)
AST: 17 U/L (ref 15–41)
Albumin: 3.5 g/dL (ref 3.5–5.0)
Alkaline Phosphatase: 42 U/L (ref 38–126)
Anion gap: 5 (ref 5–15)
BUN: 15 mg/dL (ref 6–20)
CO2: 26 mmol/L (ref 22–32)
Calcium: 9.3 mg/dL (ref 8.9–10.3)
Chloride: 107 mmol/L (ref 98–111)
Creatinine: 0.7 mg/dL (ref 0.44–1.00)
GFR, Estimated: >60 mL/min (ref >60)
Glucose, Bld: 84 mg/dL (ref 70–99)
Potassium: 4.2 mmol/L (ref 3.5–5.1)
Sodium: 138 mmol/L (ref 135–145)
Total Bilirubin: <0.2 mg/dL — ABNORMAL LOW (ref 0.3–1.2)
Total Protein: 7.5 g/dL (ref 6.5–8.1)

## 2019-11-25 LAB — IRON AND TIBC
Iron: 48 ug/dL (ref 41–142)
Saturation Ratios: 18 % — ABNORMAL LOW (ref 21–57)
TIBC: 275 ug/dL (ref 236–444)
UIBC: 227 ug/dL (ref 120–384)

## 2019-11-25 NOTE — Progress Notes (Signed)
[place] [phone]   [phone]  PROGRESS NOTE  Patient Care Team: Marissa Reid, PA-C as PCP - General Marissa Lye, MD as Consulting Physician (General Surgery) Requested, [name], Marissa Gleason, MD (General Surgery) Marissa Charleston, MD as Consulting Physician (Obstetrics and Gynecology)  Hematological/Oncological History # Iron Deficiency Anemia 2/2 to GYN Bleeding 08/12/2015: WBC 7.2, Hgb 12.8, MCV 84.9, Plt 213 03/29/2018: WBC 7.4, Hgb 10.9, MCV 89, Plt 270 04/16/2019: WBC 8.0, Hgb 9.3, MCV 83, Plt 308. Iron 16, TIBC 409, ferritin 9, Iron sat 4% 05/10/2019: establish care with Dr. Lorenso Salazar  10/30/2019: hysterectomy due to heavy menstrual bleeding. Received 4 units of PRBC and 1 dose IV iron.  11/25/2019: WBC 10.1, Hgb 11.0, MCV 88.5, Plt 384  Interval History:  Marissa Salazar 39 y.o. female with medical history significant for iron deficiency anemia 2/2 to GYN bleeding who presents for a follow up visit. The patient's last visit was on 05/10/2019 at which time she established care. In the interim since the last visit the patient missed a clinic visit due to a COVID infection in August 2021 and underwent a hysterectomy due to uncontrolled GYN bleeding on 10/30/2019. During that time she received 4 units of PRBC and IV iron.   On exam today Marissa Salazar notes she is recovering well from her hysterectomy.  She notes that she has had no residual bleeding and her surgical site is healing well.  She is continue to take her p.o. iron pills and notes that she is not having issues with constipation or stomach upset.  She does report that her energy levels are considerably better with her hemoglobin of eleven than they were previously when she was hospitalized with Covid and with her heavy GYN bleeding.  She does have some continued joint pain, but has not been having any shortness of breath or chest pain.  She otherwise has no questions concerns or  complaints.  She denies having any fevers, chills, sweats, nausea, vomiting or diarrhea.  A full ten point ROS is listed below.  MEDICAL HISTORY:  Past Medical History:  Diagnosis Date  . Abnormal Pap smear    in past; normal colpo  . Allergic rhinitis   . Allergy   . Anemia    Hx fibroids  . Blood transfusion without reported diagnosis 05/2019  . Family history of breast cancer   . Family history of colon cancer   . Family history of pancreatic cancer   . Fibroid   . Iron deficiency anemia due to chronic blood loss   . Superficial vein thrombosis 2019   Left leg  . Uterine fibroid 05/09/2019   sonohysterogram showing 7.8 x 7.4 cm fibroid with 4.6 cm in endometrial cavity - Dr. Corinna Salazar    SURGICAL HISTORY: Past Surgical History:  Procedure Laterality Date  . BREAST BIOPSY Left 06-12-14   fibroadenoma  . BUNIONECTOMY Right 2013  . COLPOSCOPY  2009  . ESOPHAGOGASTRODUODENOSCOPY N/A 07/17/2015   Procedure: ESOPHAGOGASTRODUODENOSCOPY (EGD);  Surgeon: Marissa Silvas, MD;  Location: [facility] ENDOSCOPY;  Service: Endoscopy;  Laterality: N/A;    SOCIAL HISTORY: Social History   Socioeconomic History  . Marital status: Married    Spouse name: Not on file  . Number of children: 1  . Years of education: Not on file  . Highest education level: Not on file  Occupational History  . Occupation: Ship broker  Tobacco Use  . Smoking status: Never Smoker  . Smokeless tobacco: Never Used  Vaping Use  . Vaping Use: Never used  Substance and Sexual Activity  . Alcohol use: Yes    Alcohol/week: 0.0 standard drinks    Comment: Rare  . Drug use: No  . Sexual activity: Not Currently    Birth control/protection: None  Other Topics Concern  . Not on file  Social History Narrative   Married      Marissa Salazar; 68 y/o son      Ship broker; [employer] program      Regular exercise         Social Determinants of Health   Financial Resource Strain:   . Difficulty of Paying Living Expenses: Not on file  Food  Insecurity:   . Worried About Charity fundraiser in the Last Year: Not on file  . Ran Out of Food in the Last Year: Not on file  Transportation Needs:   . Lack of Transportation (Medical): Not on file  . Lack of Transportation (Non-Medical): Not on file  Physical Activity:   . Days of Exercise per Week: Not on file  . Minutes of Exercise per Session: Not on file  Stress:   . Feeling of Stress : Not on file  Social Connections:   . Frequency of Communication with Friends and Family: Not on file  . Frequency of Social Gatherings with Friends and Family: Not on file  . Attends Religious Services: Not on file  . Active Member of Clubs or Organizations: Not on file  . Attends Archivist Meetings: Not on file  . Marital Status: Not on file  Intimate Partner Violence:   . Fear of Current or Ex-Partner: Not on file  . Emotionally Abused: Not on file  . Physically Abused: Not on file  . Sexually Abused: Not on file    FAMILY HISTORY: Family History  Problem Relation Age of Onset  . Breast cancer Maternal Aunt 60       BRCA negative  . Colon cancer Maternal Grandmother 40  . Breast cancer Maternal Grandmother 78  . Hypertension Maternal Grandmother   . Breast cancer Other        GP  . Lung cancer Other        GP  . Diabetes Paternal Uncle   . Heart attack Maternal Uncle   . Pancreatic cancer Maternal Aunt 44    ALLERGIES:  is allergic to ivp dye [iodinated diagnostic agents].  MEDICATIONS:  Current Outpatient Medications  Medication Sig Dispense Refill  . cholecalciferol (VITAMIN D3) 25 MCG (1000 UNIT) tablet Take 1,000 Units by mouth daily.    Marland Kitchen ibuprofen (ADVIL) 800 MG tablet Take 800 mg by mouth every 8 (eight) hours as needed.    . ferrous sulfate 325 (65 FE) MG tablet TAKE 1 TABLET BY MOUTH EVERY DAY WITH BREAKFAST (Patient taking differently: Take 325 mg by mouth daily with breakfast. ) 30 tablet 2  . Multiple Vitamin (MULTIVITAMIN) tablet Take 1 tablet by  mouth daily.     No current facility-administered medications for this visit.    REVIEW OF SYSTEMS:   Constitutional: ( - ) fevers, ( - )  chills , ( - ) night sweats Eyes: ( - ) blurriness of vision, ( - ) double vision, ( - ) watery eyes Ears, nose, mouth, throat, and face: ( - ) mucositis, ( - ) sore throat Respiratory: ( - ) cough, ( - ) dyspnea, ( - ) wheezes Cardiovascular: ( - ) palpitation, ( - ) chest discomfort, ( - )  lower extremity swelling Gastrointestinal:  ( - ) nausea, ( - ) heartburn, ( - ) change in bowel habits Skin: ( - ) abnormal skin rashes Lymphatics: ( - ) new lymphadenopathy, ( - ) easy bruising Neurological: ( - ) numbness, ( - ) tingling, ( - ) new weaknesses Behavioral/Psych: ( - ) mood change, ( - ) new changes  All other systems were reviewed with the patient and are negative.  PHYSICAL EXAMINATION:  Vitals:   11/25/19 0939  BP: 113/81  Pulse: 73  Resp: 17  Temp: (!) 96.9 F (36.1 C)  SpO2: 100%   Filed Weights   11/25/19 0939  Weight: 131 lb 3.2 oz (59.5 kg)    GENERAL: well appearing young Caucasian female. alert, no distress and comfortable SKIN: skin color, texture, turgor are normal, no rashes or significant lesions EYES: conjunctiva are pink and non-injected, sclera clear LUNGS: clear to auscultation and percussion with normal breathing effort HEART: regular rate & rhythm and no murmurs and no lower extremity edema Musculoskeletal: no cyanosis of digits and no clubbing  PSYCH: alert & oriented x 3, fluent speech NEURO: no focal motor/sensory deficits  LABORATORY DATA:  I have reviewed the data as listed CBC Latest Ref Rng & Units 11/25/2019 11/12/2019 10/09/2019  WBC 4.0 - 10.5 K/uL 10.1 7.0 11.9(H)  Hemoglobin 12.0 - 15.0 g/dL 11.0(L) 11.0(L) 9.4(L)  Hematocrit 36 - 46 % 34.6(L) 33.7(L) 30.1(L)  Platelets 150 - 400 K/uL 384 368 332    CMP Latest Ref Rng & Units 11/25/2019 11/12/2019 10/09/2019  Glucose 70 - 99 mg/dL 84 85 94  BUN 6  - 20 mg/dL _0 Creatinine 0.44 - 1.00 mg/dL 0.70 0.75 0.65  Sodium 135 - 145 mmol/L 138 139 135  Potassium 3.5 - 5.1 mmol/L 4.2 4.7 4.2  Chloride 98 - 111 mmol/L 107 102 105  CO2 22 - 32 mmol/L 26 24 21(L)  Calcium 8.9 - 10.3 mg/dL 9.3 9.0 8.5(L)  Total Protein 6.5 - 8.1 g/dL 7.5 6.8 6.4(L)  Total Bilirubin 0.3 - 1.2 mg/dL <0.2(L) 0.3 0.3  Alkaline Phos 38 - 126 U/L 42 43(L) 42  AST 15 - 41 U/L 17 23 90(H)  ALT 0 - 44 U/L 15 20 93(H)    RADIOGRAPHIC STUDIES: No results found.  ASSESSMENT & PLAN Marissa Salazar 39 y.o. female with medical history significant for iron deficiency anemia 2/2 to GYN bleeding who presents for a follow up visit.  After review the labs, the records, discussion with the patient the findings are most consistent with iron deficiency anemia secondary to GYN bleeding.  The patient has subsequently had a hysterectomy on 10/30/2019 and therefore the source of her bleeding is now controlled.  She received 4 units of packed red blood cells as well as IV iron during her hospitalization and now her hemoglobin levels are up to 11.0.  She continues to take p.o. iron which is likely helping to replenish her stores.  I would recommend return in 3 months time to reevaluate and assure that her hemoglobin has normalized and her iron stores are replete.  In the event that this is not effective I would recommend consideration of additional IV iron therapy.  # Iron Deficiency Anemia 2/2 to GYN Bleeding --today will order CBC, CMP, iron panel and ferritin --patient has continued taking PO iron 333m daily supplementation. She is tolerating this well. --s/p hysterectomy on 10/30/2019. Hgb improved to 11.0 today.  --recommend the patient return to clinic  in 3 months time to re-evaluate iron levels/stores  and assure return to normal Hgb  #History of Superficial Thrombophlebitis --no current issues, occurred previously in 2019 --continue to monitor   No orders of the defined types  were placed in this encounter.  All questions were answered. The patient knows to call the clinic with any problems, questions or concerns.  A total of more than 30 minutes were spent on this encounter and over half of that time was spent on counseling and coordination of care as outlined above.   Ledell Peoples, MD Department of Hematology/Oncology [place] at [facility] Phone: [phone] Pager: [phone] Email: [email or web address]  11/25/2019 10:42 AM

## 2019-11-26 ENCOUNTER — Telehealth: Payer: Self-pay | Admitting: Hematology and Oncology

## 2019-11-26 ENCOUNTER — Encounter: Payer: Self-pay | Admitting: Physician Assistant

## 2019-11-26 NOTE — Telephone Encounter (Signed)
Scheduled per los. Called and left msg. Mailed printout  °

## 2019-11-28 ENCOUNTER — Telehealth: Payer: Self-pay | Admitting: *Deleted

## 2019-11-28 NOTE — Telephone Encounter (Signed)
-----   Message from Orson Slick, MD sent at 11/27/2019 12:00 PM EDT ----- Please let Mrs. Savary know that her iron levels are improving, but no quite at goal. We will see her back in 3 months time with interval daily PO iron.  ----- Message ----- From: Buel Ream, Lab In [place] Sent: 11/25/2019   9:32 AM EDT To: Orson Slick, MD

## 2019-11-28 NOTE — Telephone Encounter (Signed)
TCT patient regarding lab results from 11/25/19. Spoke with patient  and informed her that her iron levels are improving but not yet @ goal. Advised to continue her oral iron and that we will see her back in 3 months. She voiced understanding and is aware of her appt. In January 2022

## 2019-12-04 LAB — HEPATITIS B SURFACE ANTIBODY,QUALITATIVE: Hep B C IgM: IMMUNE

## 2020-01-01 ENCOUNTER — Other Ambulatory Visit: Payer: Self-pay | Admitting: Hematology and Oncology

## 2020-01-06 ENCOUNTER — Other Ambulatory Visit: Payer: Self-pay | Admitting: Obstetrics and Gynecology

## 2020-01-07 MED FILL — ZOLPIDEM TARTRATE 10 MG TAB: 10 | 30 days supply | Qty: 30 | Fill #0

## 2020-01-23 ENCOUNTER — Ambulatory Visit: Payer: No Typology Code available for payment source | Admitting: Physician Assistant

## 2020-01-23 ENCOUNTER — Telehealth: Payer: Self-pay | Admitting: Physician Assistant

## 2020-01-23 DIAGNOSIS — Z789 Other specified health status: Secondary | ICD-10-CM

## 2020-01-23 NOTE — Telephone Encounter (Signed)
Future labs ordered. Patient having labs drawn at [place] [facility] in [place], [place] tomorrow. She will call when shes headed that way. AS, CMA

## 2020-01-23 NOTE — Telephone Encounter (Signed)
Patient needs lab orders placed for [facility].  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  She is going to [employer] for a UA and wanted to know if the orders could be put in so she could get everything completed at one time.  If there are any issues, please call the patient.

## 2020-01-24 ENCOUNTER — Other Ambulatory Visit: Payer: No Typology Code available for payment source

## 2020-01-24 ENCOUNTER — Encounter: Payer: Self-pay | Admitting: Physician Assistant

## 2020-01-24 DIAGNOSIS — Z789 Other specified health status: Secondary | ICD-10-CM

## 2020-01-24 NOTE — Telephone Encounter (Signed)
Labs faxed to [place] office. Please see mychart message for further details. AS, CMA

## 2020-01-25 LAB — MEASLES/MUMPS/RUBELLA IMMUNITY
MUMPS ABS, IGG: 293 AU/mL (ref >10.9)
RUBEOLA AB, IGG: <13.5 AU/mL — ABNORMAL LOW (ref >16.4)
Rubella Antibodies, IGG: 1.88 index (ref >0.99)

## 2020-01-25 LAB — VARICELLA ZOSTER ANTIBODY, IGG: Varicella zoster IgG: 346 index (ref >165)

## 2020-01-26 ENCOUNTER — Other Ambulatory Visit: Payer: Self-pay

## 2020-01-26 ENCOUNTER — Ambulatory Visit
Admission: EM | Admit: 2020-01-26 | Discharge: 2020-01-26 | Disposition: A | Discharge status: Home / Self Care | Payer: No Typology Code available for payment source | Attending: Family Medicine | Admitting: Family Medicine

## 2020-01-26 DIAGNOSIS — J0110 Acute frontal sinusitis, unspecified: Secondary | ICD-10-CM | POA: Insufficient documentation

## 2020-01-26 LAB — POCT RAPID STREP A (OFFICE): Rapid Strep A Screen: NEGATIVE

## 2020-01-26 MED ORDER — AZITHROMYCIN 250 MG PO TABS: 250.0000 mg | ORAL_TABLET | Freq: Every day | ORAL | 0 refills | Status: DC

## 2020-01-26 MED ORDER — PREDNISONE 5 MG PO TABS: ORAL_TABLET | ORAL | 0 refills | Status: DC

## 2020-01-26 NOTE — ED Provider Notes (Signed)
[facility]    CSN: [id number] Arrival date & time: 01/26/20  1339      History   Chief Complaint Chief Complaint  Patient presents with  . Sore Throat    X 4 days  . Facial Pain    X 4 days    HPI Marissa Salazar is a 39 y.o. female.   She is presenting with a about a week history of sore throat.  She is also having sinus pressure and concern for sexually transmitted infection.  She denies any fevers or chills.  She was diagnosed with Covid in August.  Has tried several over-the-counter medications.  Sore throat seems to be getting worse.  HPI  Past Medical History:  Diagnosis Date  . Abnormal Pap smear    in past; normal colpo  . Allergic rhinitis   . Allergy   . Anemia    Hx fibroids  . Blood transfusion without reported diagnosis 05/2019  . Family history of breast cancer   . Family history of colon cancer   . Family history of pancreatic cancer   . Fibroid   . Iron deficiency anemia due to chronic blood loss   . Superficial vein thrombosis 2019   Left leg  . Uterine fibroid 05/09/2019   sonohysterogram showing 7.8 x 7.4 cm fibroid with 4.6 cm in endometrial cavity - Dr. [name]    Patient Active Problem List   Diagnosis Date Noted  . Symptomatic anemia 10/05/2019  . Lab test positive for detection of COVID-19 virus 10/05/2019  . Uterine leiomyoma 04/16/2019  . 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 Family history of breast cancer   . Family history of colon cancer   . Family history of pancreatic cancer   . Fibrositis 11/07/2018  . Iron deficiency anemia 03/29/2018  . Elevated pulse rate 03/29/2018  . Menorrhagia 03/29/2018  . History of uterine fibroid 03/29/2018  . GERD (gastroesophageal reflux disease) 05/23/2017  . History of Helicobacter pylori infection 05/23/2017  . Nevus 09/29/2016  . Family history of breast cancer in female 05/28/2014    Past Surgical History:  Procedure Laterality Date  . BREAST BIOPSY Left 06-12-14    fibroadenoma  . BUNIONECTOMY Right 2013  . COLPOSCOPY  2009  . ESOPHAGOGASTRODUODENOSCOPY N/A 07/17/2015   Procedure: ESOPHAGOGASTRODUODENOSCOPY (EGD);  Surgeon: Manya Silvas, MD;  Location: [facility] ENDOSCOPY;  Service: Endoscopy;  Laterality: N/A;    OB History    Gravida  2   Para  2   Term  2   Preterm  0   AB  0   Living  2     SAB  0   IAB  0   Ectopic  0   Multiple  0   Live Births  1        Obstetric Comments  1st Menstrual Cycle: 13  1st Pregnancy:  24          Home Medications    Prior to Admission medications   Medication Sig Start Date End Date Taking? Authorizing Provider  azithromycin (ZITHROMAX) 250 MG tablet Take 1 tablet (250 mg total) by mouth daily. Take first 2 tablets together, then 1 every day until finished. 01/26/20   Rosemarie Ax, MD  cholecalciferol (VITAMIN D3) 25 MCG (1000 UNIT) tablet Take 1,000 Units by mouth daily.    [provider]  ferrous sulfate 325 (65 FE) MG tablet Take 1 tablet (325 mg total) by mouth daily with breakfast. 01/01/20  Orson Slick, MD  ibuprofen (ADVIL) 800 MG tablet Take 800 mg by mouth every 8 (eight) hours as needed.    [provider]  Multiple Vitamin (MULTIVITAMIN) tablet Take 1 tablet by mouth daily.    [provider]  predniSONE (DELTASONE) 5 MG tablet Take 6 pills for first day, 5 pills second day, 4 pills third day, 3 pills fourth day, 2 pills the fifth day, and 1 pill sixth day. 01/26/20   Rosemarie Ax, MD    Family History Family History  Problem Relation Age of Onset  . Breast cancer Maternal Aunt 60       BRCA negative  . Colon cancer Maternal Grandmother 60  . Breast cancer Maternal Grandmother 70  . Hypertension Maternal Grandmother   . Breast cancer Other        GP  . Lung cancer Other        GP  . Diabetes Paternal Uncle   . Heart attack Maternal Uncle   . Pancreatic cancer Maternal Aunt 44    Social History Social History    Tobacco Use  . Smoking status: Never Smoker  . Smokeless tobacco: Never Used  Vaping Use  . Vaping Use: Never used  Substance Use Topics  . Alcohol use: Yes    Alcohol/week: 0.0 standard drinks    Comment: Rare  . Drug use: No     Allergies   Ivp dye [iodinated diagnostic agents]   Review of Systems Review of Systems  See HPI  Physical Exam Triage Vital Signs ED Triage Vitals  Enc Vitals Group     BP 01/26/20 1352 (!) 143/91     Pulse Rate 01/26/20 1352 88     Resp 01/26/20 1352 17     Temp 01/26/20 1352 98 F (36.7 C)     Temp Source 01/26/20 1352 Oral     SpO2 01/26/20 1352 99 %     Weight --      Height --      Head Circumference --      Peak Flow --      Pain Score 01/26/20 1353 7     Pain Loc --      Pain Edu? --      Excl. in [place]? --    No data found.  Updated Vital Signs BP (!) 143/91 (BP Location: Right Arm)   Pulse 88   Temp 98 F (36.7 C) (Oral)   Resp 17   SpO2 99%   Visual Acuity Right Eye Distance:   Left Eye Distance:   Bilateral Distance:    Right Eye Near:   Left Eye Near:    Bilateral Near:     Physical Exam Gen: NAD, alert, cooperative with exam, well-appearing ENT: normal lips, normal nasal mucosa, no tonsillar exudates Eye: normal EOM, normal conjunctiva and lids Skin: no rashes, no areas of induration  Neuro: normal tone, normal sensation to touch Psych:  normal insight, alert and oriented    UC Treatments / Results  Labs (all labs ordered are listed, but only abnormal results are displayed) Labs Reviewed  POCT RAPID STREP A (OFFICE)  CYTOLOGY, (ORAL, ANAL, URETHRAL) ANCILLARY ONLY  CERVICOVAGINAL ANCILLARY ONLY    EKG   Radiology No results found.  Procedures Procedures (including critical care time)  Medications Ordered in UC Medications - No data to display  Initial Impression / Assessment and Plan / UC Course  I have reviewed the triage vital signs and the  nursing notes.  Pertinent labs & imaging  results that were available during my care of the patient were reviewed by me and considered in my medical decision making (see chart for details).     Marissa Salazar is a 39 year old female that is presenting with symptoms suggestive of sinusitis.  She also is presenting with sore throat.  Her rapid strep was negative.  A throat swab was obtained.  She also has concern for sexual transmission of sore throat pain.  Cytology swab was obtained as well.  Counseled on when to use the prednisone azithromycin for sinusitis if her symptoms are still present.  Counseled supportive care.  Given indications of follow-up.  Final Clinical Impressions(s) / UC Diagnoses   Final diagnoses:  Acute non-recurrent frontal sinusitis     Discharge Instructions     We will call with any positive results.  Please follow up if your symptoms fail to improve.     ED Prescriptions    Medication Sig Dispense Auth. Provider   predniSONE (DELTASONE) 5 MG tablet Take 6 pills for first day, 5 pills second day, 4 pills third day, 3 pills fourth day, 2 pills the fifth day, and 1 pill sixth day. 21 tablet Rosemarie Ax, MD   azithromycin (ZITHROMAX) 250 MG tablet Take 1 tablet (250 mg total) by mouth daily. Take first 2 tablets together, then 1 every day until finished. 6 tablet Rosemarie Ax, MD     PDMP not reviewed this encounter.   Rosemarie Ax, MD 01/26/20 [phone]

## 2020-01-26 NOTE — Discharge Instructions (Addendum)
We will call with any positive results.  Please follow up if your symptoms fail to improve.

## 2020-01-26 NOTE — ED Triage Notes (Signed)
Patient states she has had a worsening sore throat x 4 days as well sinus pain and pressure for 4 days. Pt is aox4 and ambulatory.

## 2020-01-28 ENCOUNTER — Telehealth (HOSPITAL_COMMUNITY): Payer: Self-pay | Admitting: Emergency Medicine

## 2020-01-28 LAB — CYTOLOGY, (ORAL, ANAL, URETHRAL) ANCILLARY ONLY
Chlamydia: NEGATIVE
Comment: NEGATIVE
Comment: NORMAL
Neisseria Gonorrhea: NEGATIVE

## 2020-01-28 LAB — CERVICOVAGINAL ANCILLARY ONLY
Bacterial Vaginitis (gardnerella): POSITIVE — AB
Candida Glabrata: NEGATIVE
Candida Vaginitis: NEGATIVE
Chlamydia: NEGATIVE
Comment: NEGATIVE
Comment: NEGATIVE
Comment: NEGATIVE
Comment: NEGATIVE
Comment: NEGATIVE
Comment: NORMAL
Neisseria Gonorrhea: NEGATIVE
Trichomonas: NEGATIVE

## 2020-01-28 MED ORDER — METRONIDAZOLE 500 MG PO TABS: 500.0000 mg | ORAL_TABLET | Freq: Two times a day (BID) | ORAL | 0 refills | Status: DC

## 2020-02-26 ENCOUNTER — Inpatient Hospital Stay: Payer: No Typology Code available for payment source

## 2020-02-26 ENCOUNTER — Inpatient Hospital Stay
Payer: No Typology Code available for payment source | Attending: Hematology and Oncology | Admitting: Hematology and Oncology

## 2020-05-07 ENCOUNTER — Ambulatory Visit (INDEPENDENT_AMBULATORY_CARE_PROVIDER_SITE_OTHER): Payer: No Typology Code available for payment source | Admitting: Physician Assistant

## 2020-05-07 ENCOUNTER — Encounter: Payer: Self-pay | Admitting: Physician Assistant

## 2020-05-07 ENCOUNTER — Other Ambulatory Visit: Payer: Self-pay

## 2020-05-07 VITALS — BP 93/60 | HR 60 | Temp 97.4°F | Ht 64.0 in | Wt 127.5 lb

## 2020-05-07 DIAGNOSIS — R5383 Other fatigue: Secondary | ICD-10-CM

## 2020-05-07 DIAGNOSIS — Z13220 Encounter for screening for lipoid disorders: Secondary | ICD-10-CM

## 2020-05-07 DIAGNOSIS — F4321 Adjustment disorder with depressed mood: Secondary | ICD-10-CM | POA: Insufficient documentation

## 2020-05-07 DIAGNOSIS — Z0000 Encounter for general adult medical examination without abnormal findings: Secondary | ICD-10-CM | POA: Diagnosis not present

## 2020-05-07 DIAGNOSIS — L309 Dermatitis, unspecified: Secondary | ICD-10-CM | POA: Insufficient documentation

## 2020-05-07 DIAGNOSIS — I7300 Raynaud's syndrome without gangrene: Secondary | ICD-10-CM | POA: Insufficient documentation

## 2020-05-07 DIAGNOSIS — R7989 Other specified abnormal findings of blood chemistry: Secondary | ICD-10-CM

## 2020-05-07 DIAGNOSIS — M21619 Bunion of unspecified foot: Secondary | ICD-10-CM | POA: Insufficient documentation

## 2020-05-07 DIAGNOSIS — R300 Dysuria: Secondary | ICD-10-CM

## 2020-05-07 DIAGNOSIS — K529 Noninfective gastroenteritis and colitis, unspecified: Secondary | ICD-10-CM | POA: Insufficient documentation

## 2020-05-07 DIAGNOSIS — Z131 Encounter for screening for diabetes mellitus: Secondary | ICD-10-CM

## 2020-05-07 DIAGNOSIS — Z3400 Encounter for supervision of normal first pregnancy, unspecified trimester: Secondary | ICD-10-CM | POA: Insufficient documentation

## 2020-05-07 DIAGNOSIS — R600 Localized edema: Secondary | ICD-10-CM

## 2020-05-07 DIAGNOSIS — O2690 Pregnancy related conditions, unspecified, unspecified trimester: Secondary | ICD-10-CM | POA: Insufficient documentation

## 2020-05-07 DIAGNOSIS — M549 Dorsalgia, unspecified: Secondary | ICD-10-CM

## 2020-05-07 LAB — POCT URINALYSIS DIPSTICK
Bilirubin, UA: NEGATIVE
Blood, UA: NEGATIVE
Glucose, UA: NEGATIVE
Ketones, UA: NEGATIVE
Leukocytes, UA: NEGATIVE
Nitrite, UA: NEGATIVE
Protein, UA: NEGATIVE
Spec Grav, UA: 1.015 (ref 1.010–1.025)
Urobilinogen, UA: 0.2 E.U./dL
pH, UA: 7 (ref 5.0–8.0)

## 2020-05-07 NOTE — Patient Instructions (Signed)
Preventive Care 11-38 Years Old, Female Preventive care refers to lifestyle choices and visits with your health care provider that can promote health and wellness. This includes:  A yearly physical exam. This is also called an annual wellness visit.  Regular dental and eye exams.  Immunizations.  Screening for certain conditions.  Healthy lifestyle choices, such as: ? Eating a healthy diet. ? Getting regular exercise. ? Not using drugs or products that contain nicotine and tobacco. ? Limiting alcohol use. What can I expect for my preventive care visit? Physical exam Your health care provider may check your:  Height and weight. These may be used to calculate your BMI (body mass index). BMI is a measurement that tells if you are at a healthy weight.  Heart rate and blood pressure.  Body temperature.  Skin for abnormal spots. Counseling Your health care provider may ask you questions about your:  Past medical problems.  Family's medical history.  Alcohol, tobacco, and drug use.  Emotional well-being.  Home life and relationship well-being.  Sexual activity.  Diet, exercise, and sleep habits.  Work and work Statistician.  Access to firearms.  Method of birth control.  Menstrual cycle.  Pregnancy history. What immunizations do I need? Vaccines are usually given at various ages, according to a schedule. Your health care provider will recommend vaccines for you based on your age, medical history, and lifestyle or other factors, such as travel or where you work.   What tests do I need? Blood tests  Lipid and cholesterol levels. These may be checked every 5 years starting at age 64.  Hepatitis C test.  Hepatitis B test. Screening  Diabetes screening. This is done by checking your blood sugar (glucose) after you have not eaten for a while (fasting).  STD (sexually transmitted disease) testing, if you are at risk.  BRCA-related cancer screening. This may  be done if you have a family history of breast, ovarian, tubal, or peritoneal cancers.  Pelvic exam and Pap test. This may be done every 3 years starting at age 61. Starting at age 82, this may be done every 5 years if you have a Pap test in combination with an HPV test. Talk with your health care provider about your test results, treatment options, and if necessary, the need for more tests.   Follow these instructions at home: Eating and drinking  Eat a healthy diet that includes fresh fruits and vegetables, whole grains, lean protein, and low-fat dairy products.  Take vitamin and mineral supplements as recommended by your health care provider.  Do not drink alcohol if: ? Your health care provider tells you not to drink. ? You are pregnant, may be pregnant, or are planning to become pregnant.  If you drink alcohol: ? Limit how much you have to 0-1 drink a day. ? Be aware of how much alcohol is in your drink. In the [place] one drink equals one 12 oz bottle of beer (355 mL), one 5 oz glass of wine (148 mL), or one 1 oz glass of hard liquor (44 mL).   Lifestyle  Take daily care of your teeth and gums. Brush your teeth every morning and night with fluoride toothpaste. Floss one time each day.  Stay active. Exercise for at least 30 minutes 5 or more days each week.  Do not use any products that contain nicotine or tobacco, such as cigarettes, e-cigarettes, and chewing tobacco. If you need help quitting, ask your health care provider.  Do  not use drugs.  If you are sexually active, practice safe sex. Use a condom or other form of protection to prevent STIs (sexually transmitted infections).  If you do not wish to become pregnant, use a form of birth control. If you plan to become pregnant, see your health care provider for a prepregnancy visit.  Find healthy ways to cope with stress, such as: ? Meditation, yoga, or listening to music. ? Journaling. ? Talking to a trusted  person. ? Spending time with friends and family. Safety  Always wear your seat belt while driving or riding in a vehicle.  Do not drive: ? If you have been drinking alcohol. Do not ride with someone who has been drinking. ? When you are tired or distracted. ? While texting.  Wear a helmet and other protective equipment during sports activities.  If you have firearms in your house, make sure you follow all gun safety procedures.  Seek help if you have been physically or sexually abused. What's next?  Go to your health care provider once a year for an annual wellness visit.  Ask your health care provider how often you should have your eyes and teeth checked.  Stay up to date on all vaccines. This information is not intended to replace advice given to you by your health care provider. Make sure you discuss any questions you have with your health care provider. Document Revised: 09/22/2019 Document Reviewed: 10/05/2017 Elsevier Patient Education  2021 [employer].

## 2020-05-07 NOTE — Progress Notes (Signed)
Subjective:     Marissa Salazar is a 40 y.o. female and is here for a comprehensive physical exam. The patient reports problems - more fatigue than usual and intermittent lower extremity swelling. Intermittent pain around trapezius area.  Social History   Socioeconomic History  . Marital status: Divorced    Spouse name: Not on file  . Number of children: 1  . Years of education: Not on file  . Highest education level: Not on file  Occupational History  . Occupation: Ship broker  Tobacco Use  . Smoking status: Never Smoker  . Smokeless tobacco: Never Used  Vaping Use  . Vaping Use: Never used  Substance and Sexual Activity  . Alcohol use: Yes    Alcohol/week: 0.0 standard drinks    Comment: Rare  . Drug use: No  . Sexual activity: Not Currently    Birth control/protection: None  Other Topics Concern  . Not on file  Social History Narrative   Married      [name]; 14 y/o son      Ship broker; [employer] program      Regular exercise         Social Determinants of Health   Financial Resource Strain: Not on file  Food Insecurity: Not on file  Transportation Needs: Not on file  Physical Activity: Not on file  Stress: Not on file  Social Connections: Not on file  Intimate Partner Violence: Not on file   Health Maintenance  Topic Date Due  . Hepatitis C Screening  Never done  . COVID-19 Vaccine (3 - Pfizer risk 4-dose series) 06/29/2019  . TETANUS/TDAP  08/22/2020  . PAP SMEAR-Modifier  05/27/2022  . INFLUENZA VACCINE  Completed  . HIV Screening  Completed  . HPV VACCINES  Aged Out    The following portions of the patient's history were reviewed and updated as appropriate: allergies, current medications, past family history, past medical history, past social history, past surgical history and problem list.  Review of Systems Pertinent items noted in HPI and remainder of comprehensive ROS otherwise negative.   Objective:    BP 93/60   Pulse 60   Temp (!) 97.4 F (36.3 C)    Ht 5\' 4"  (1.626 m)   Wt 127 lb 8 oz (57.8 kg)   LMP  (LMP Unknown)   SpO2 100%   BMI 21.89 kg/m  General appearance: alert, cooperative and no distress Head: Normocephalic, without obvious abnormality, atraumatic Eyes: conjunctivae/corneas clear. PERRL, EOM's intact. Fundi benign. Ears: normal TM's and external ear canals both ears Nose: Nares normal. Septum midline. Mucosa normal. No drainage or sinus tenderness. Throat: lips, mucosa, and tongue normal; teeth and gums normal Neck: no adenopathy, no JVD, supple, symmetrical, trachea midline and thyroid not enlarged, symmetric, no tenderness/mass/nodules Back: symmetric, no curvature. ROM normal. No CVA tenderness. Lungs: clear to auscultation bilaterally Heart: regular rate and rhythm, S1, S2 normal, no murmur, click, rub or gallop Abdomen: soft, non-tender; bowel sounds normal; no masses,  no organomegaly Extremities: extremities normal, atraumatic, no cyanosis or edema Pulses: 2+ and symmetric Skin: Skin color, texture, turgor normal. No rashes or lesions Lymph nodes: Cervical adenopathy: normal and Supraclavicular adenopathy: normal Neurologic: Grossly normal    Assessment:    Healthy female exam.     Plan:  -UTD on pap smear (s/p hysterectomy 11/2019, followed by Ob-Gyn), immunizations. Declined Hep C screening. -Will collect labs and notify when available. UA collected and unremarkable, negative for protein. -Will place PT referral for upper  back pain/trapezius muscle spasticity  -Continue to follow low sodium diet, good hydration and follow a heart healthy diet. Continue with physical activity regimen. -Follow up in 1 year for CPE and FBW  See After Visit Summary for Counseling Recommendations

## 2020-05-08 LAB — CBC
Hematocrit: 42.6 % (ref 34.0–46.6)
Hemoglobin: 13.9 g/dL (ref 11.1–15.9)
MCH: 29.9 pg (ref 26.6–33.0)
MCHC: 32.6 g/dL (ref 31.5–35.7)
MCV: 92 fL (ref 79–97)
Platelets: 295 10*3/uL (ref 150–450)
RBC: 4.65 x10E6/uL (ref 3.77–5.28)
RDW: 12.2 % (ref 11.7–15.4)
WBC: 7.5 10*3/uL (ref 3.4–10.8)

## 2020-05-08 LAB — COMPREHENSIVE METABOLIC PANEL
ALT: 24 IU/L (ref 0–32)
AST: 31 IU/L (ref 0–40)
Albumin/Globulin Ratio: 1.6 (ref 1.2–2.2)
Albumin: 4.6 g/dL (ref 3.8–4.8)
Alkaline Phosphatase: 54 IU/L (ref 44–121)
BUN/Creatinine Ratio: 16 (ref 9–23)
BUN: 12 mg/dL (ref 6–20)
Bilirubin Total: 0.4 mg/dL (ref 0.0–1.2)
CO2: 23 mmol/L (ref 20–29)
Calcium: 9.6 mg/dL (ref 8.7–10.2)
Chloride: 98 mmol/L (ref 96–106)
Creatinine, Ser: 0.76 mg/dL (ref 0.57–1.00)
Globulin, Total: 2.9 g/dL (ref 1.5–4.5)
Glucose: 86 mg/dL (ref 65–99)
Potassium: 4.5 mmol/L (ref 3.5–5.2)
Sodium: 138 mmol/L (ref 134–144)
Total Protein: 7.5 g/dL (ref 6.0–8.5)
eGFR: 103 mL/min/{1.73_m2} (ref >59)

## 2020-05-08 LAB — LIPID PANEL
Chol/HDL Ratio: 1.6 ratio (ref 0.0–4.4)
Cholesterol, Total: 213 mg/dL — ABNORMAL HIGH (ref 100–199)
HDL: 132 mg/dL (ref >39)
LDL Chol Calc (NIH): 72 mg/dL (ref 0–99)
Triglycerides: 46 mg/dL (ref 0–149)
VLDL Cholesterol Cal: 9 mg/dL (ref 5–40)

## 2020-05-08 LAB — T3: T3, Total: 79 ng/dL (ref 71–180)

## 2020-05-08 LAB — HEMOGLOBIN A1C
Est. average glucose Bld gHb Est-mCnc: 91 mg/dL
Hgb A1c MFr Bld: 4.8 % (ref 4.8–5.6)

## 2020-05-08 LAB — T4, FREE: Free T4: 1.09 ng/dL (ref 0.82–1.77)

## 2020-05-08 LAB — TSH: TSH: 1.51 u[IU]/mL (ref 0.450–4.500)

## 2020-05-08 LAB — VITAMIN D 25 HYDROXY (VIT D DEFICIENCY, FRACTURES): Vit D, 25-Hydroxy: 37.4 ng/mL (ref 30.0–100.0)

## 2020-05-12 ENCOUNTER — Encounter: Payer: Self-pay | Admitting: Physician Assistant

## 2020-05-18 ENCOUNTER — Encounter: Payer: Self-pay | Admitting: Physician Assistant

## 2020-05-19 ENCOUNTER — Ambulatory Visit: Payer: No Typology Code available for payment source | Admitting: Nurse Practitioner

## 2020-05-19 ENCOUNTER — Telehealth: Payer: Self-pay | Admitting: Physician Assistant

## 2020-05-19 NOTE — Telephone Encounter (Signed)
Please offer patient next available acute apt for issue to be evaluated. AS, CMA

## 2020-05-19 NOTE — Telephone Encounter (Signed)
Patient scheduled.

## 2020-05-19 NOTE — Telephone Encounter (Signed)
Patient is having pain in the neck and shoulder area. She thinks it might be a muscle spasm as she has had them before. Please advise, thanks.

## 2020-05-19 NOTE — Telephone Encounter (Signed)
Cancelled patient due to spot being after three new patients. Left message notifying patient appointment was cancelled and to call back. Will schedule for next available acute when patient calls. For documentation purpose.

## 2020-05-19 NOTE — Telephone Encounter (Signed)
Marissa Salazar states this appointment was canceled per provider due to scheduling issue.

## 2020-05-21 ENCOUNTER — Telehealth: Payer: No Typology Code available for payment source | Admitting: Orthopedic Surgery

## 2020-05-21 DIAGNOSIS — M546 Pain in thoracic spine: Secondary | ICD-10-CM | POA: Diagnosis not present

## 2020-05-21 MED ORDER — NAPROXEN 500 MG PO TABS: 500.0000 mg | ORAL_TABLET | Freq: Two times a day (BID) | ORAL | 0 refills | Status: DC

## 2020-05-21 MED ORDER — CYCLOBENZAPRINE HCL 10 MG PO TABS: 10.0000 mg | ORAL_TABLET | Freq: Three times a day (TID) | ORAL | 0 refills | Status: DC | PRN

## 2020-05-21 NOTE — Progress Notes (Signed)

## 2020-06-10 ENCOUNTER — Ambulatory Visit: Payer: No Typology Code available for payment source | Admitting: Physical Therapy

## 2020-06-16 ENCOUNTER — Ambulatory Visit: Payer: No Typology Code available for payment source | Admitting: Physical Therapy

## 2020-06-23 ENCOUNTER — Other Ambulatory Visit: Payer: Self-pay

## 2020-06-23 ENCOUNTER — Ambulatory Visit: Payer: No Typology Code available for payment source | Attending: Physician Assistant

## 2020-06-23 DIAGNOSIS — M6281 Muscle weakness (generalized): Secondary | ICD-10-CM | POA: Diagnosis present

## 2020-06-23 DIAGNOSIS — M542 Cervicalgia: Secondary | ICD-10-CM | POA: Insufficient documentation

## 2020-06-23 DIAGNOSIS — R293 Abnormal posture: Secondary | ICD-10-CM | POA: Diagnosis not present

## 2020-06-23 NOTE — Therapy (Signed)
[place], [place], [address] Phone: [phone]   Fax:  [phone]  Physical Therapy Evaluation  Patient Details  Name: Marissa Salazar MRN: [id number] Date of Birth: 11-27-1980 Referring Provider (PT): Glori Bickers, [place]   Encounter Date: 06/23/2020   PT End of Session - 06/23/20 1945    Visit Number 1    Number of Visits 12    Date for PT Re-Evaluation 08/18/20    Authorization Type [employer]    PT Start Time 1615    PT Stop Time 1704    PT Time Calculation (min) 49 min    Activity Tolerance Patient tolerated treatment well    Behavior During Therapy [facility] for tasks assessed/performed           Past Medical History:  Diagnosis Date  . Abnormal Pap smear    in past; normal colpo  . Allergic rhinitis   . Allergy   . Anemia    Hx fibroids  . Blood transfusion without reported diagnosis 05/2019  . Family history of breast cancer   . Family history of colon cancer   . Family history of pancreatic cancer   . Fibroid   . Iron deficiency anemia due to chronic blood loss   . Superficial vein thrombosis 2019   Left leg  . Uterine fibroid 05/09/2019   sonohysterogram showing 7.8 x 7.4 cm fibroid with 4.6 cm in endometrial cavity - Dr. [name]    Past Surgical History:  Procedure Laterality Date  . BREAST BIOPSY Left 06-12-14   fibroadenoma  . BUNIONECTOMY Right 2013  . COLPOSCOPY  2009  . ESOPHAGOGASTRODUODENOSCOPY N/A 07/17/2015   Procedure: ESOPHAGOGASTRODUODENOSCOPY (EGD);  Surgeon: Manya Silvas, MD;  Location: [facility] ENDOSCOPY;  Service: Endoscopy;  Laterality: N/A;    There were no vitals filed for this visit.    Subjective Assessment - 06/23/20 1619    Subjective Pt reports having a knot in her R UT that has been recurring for years. It was really bad a month ago where she could barely turn her neck, but improved after stretching. She is interested in dry needling for it due to  muscle spasms. She is a runner which does exacerbate her neck pain when it's already irritated.    Limitations Sitting    Patient Stated Goals To reduce pain    Currently in Pain? Yes    Pain Score 2     Pain Location Neck    Pain Orientation Right    Pain Descriptors / Indicators Aching;Sore    Pain Type Chronic pain    Pain Onset More than a month ago    Pain Frequency Intermittent    Aggravating Factors  when irritated, running, looking down    Pain Relieving Factors good posture, theracane              [facility] PT Assessment - 06/23/20 0001      Assessment   Medical Diagnosis M54.9 (ICD-10-CM) - Upper back pain    Referring Provider (PT) Glori Bickers, PA-C    Onset Date/Surgical Date --   years   Hand Dominance Right    Next MD Visit none scheduled    Prior Therapy None      Precautions   Precautions None      Restrictions   Weight Bearing Restrictions No      Balance Screen   Has the patient fallen in the past 6 months No  Has the patient had a decrease in activity level because of a fear of falling?  Yes   a month ago for 2 weeks   Is the patient reluctant to leave their home because of a fear of falling?  No      Home Ecologist residence      Prior Function   Level of Independence Independent    Vocation Full time employment    Vocation Requirements CMA, running around all day    [place], hiking, gardening, playing with kids      Posture/Postural Control   Posture/Postural Control Postural limitations    Posture Comments forward rounding of shoulders, mild forward head, slight decrease in thoracic kyphosis      ROM / Strength   AROM / PROM / Strength AROM;Strength      AROM   Overall AROM Comments cervical flexion hinging from C1-C2    AROM Assessment Site Cervical    Cervical Flexion 60    Cervical Extension 75    Cervical - Right Side Bend 40    Cervical - Left Side Bend 40    Cervical - Right Rotation 85     Cervical - Left Rotation 85      Palpation   Spinal mobility 2/6 hypomobility thoracic spine    Palpation comment significant TTP to R UT with trigger point noted                      Objective measurements completed on examination: See above findings.               PT Education - 06/23/20 1800    Education Details Educated on importance of mobility and low impact exercise as well, diagnosis, prognosis, HEP, and POC    Person(s) Educated Patient    Methods Explanation;Demonstration;Tactile cues;Verbal cues;Handout    Comprehension Verbalized understanding;Returned demonstration;Verbal cues required;Tactile cues required            PT Short Term Goals - 06/23/20 1803      PT SHORT TERM GOAL #1   Title Pt will be independent and compliant with initial HEP.    Baseline provided at initial evaluation    Time 3    Period Weeks    Status New    Target Date 07/14/20      PT SHORT TERM GOAL #2   Title FOTO will be taken by visit 3 with LTG created.    Time 3    Period Weeks    Status New    Target Date 07/14/20      PT SHORT TERM GOAL #3   Title DNF baseline testing will be assessed by visit 3 with LTG created.    Time 3    Period Weeks    Status New    Target Date 07/14/20             PT Long Term Goals - 06/23/20 1937      PT LONG TERM GOAL #1   Title Pt will be indepednent with advanced HEP and progressions for long term maintenacne.    Time 8    Period Weeks    Status New    Target Date 08/18/20      PT LONG TERM GOAL #2   Title Pt will report no limitation due to neck/upper back and <2/10 pain with exercise.    Baseline recently had to take 2 weeks off due to pain  Time 8    Period Weeks    Status New    Target Date 08/18/20      PT LONG TERM GOAL #3   Title Pt will understand and verbalize techniques to manage tightness and limiting pain in neck/upper shoulder.    Time 8    Period Weeks    Status New    Target Date  08/18/20                  Plan - 06/23/20 1751    Clinical Impression Statement Pt is a 40 yo female who presents to OP PT with neck pain related to hypermobility that has been recurrent for years. Pt is very active as a runner and does [employer], reportedly not stretching or mobilizing. She demonstrates impairments in posture with mild forward head and forward rounding of shoulders, hypomobility in thoracic spine, and increased accessory neck muscle activation especially R UT with significant painful knot. She was educated on importance of mobility and low impact exercise as well, diagnosis, prognosis, HEP, and POC. Pt verbalized understanding and consent to tx. She could benefit from skilled PT 1-2x/week for 6-8 weeks to address impairments and resolve pain.    Personal Factors and Comorbidities Time since onset of injury/illness/exacerbation;Past/Current Experience    Examination-Activity Limitations Bend;Sleep    Examination-Participation Restrictions Yard Work;Occupation;Cleaning   if irritated, everything hurts her neck   Stability/Clinical Decision Making Stable/Uncomplicated    Clinical Decision Making Low    Rehab Potential Good    PT Frequency --   1-2x/week   PT Duration --   6-8 weeks   PT Treatment/Interventions ADLs/Self Care Home Management;Cryotherapy;Electrical Stimulation;Iontophoresis 4mg /ml Dexamethasone;Moist Heat;Traction;Functional mobility training;Therapeutic activities;Therapeutic exercise;Neuromuscular re-education;Patient/family education;Manual techniques;Passive range of motion;Dry needling;Taping;Spinal Manipulations;Joint Manipulations    PT Next Visit Plan FOTO!!!, DNF strength, Assess response to HEP/update PRN, thoracic mobility, DNF, periscapular strength    PT Home Exercise Plan [email or web address]    Consulted and Agree with Plan of Care Patient           Patient will benefit from skilled therapeutic intervention in order to improve the following deficits  and impairments:  Pain,Improper body mechanics,Postural dysfunction,Increased muscle spasms,Impaired flexibility,Decreased range of motion,Decreased strength,Impaired perceived functional ability,Decreased mobility,Increased fascial restricitons  Visit Diagnosis: Abnormal posture - Plan: PT plan of care cert/re-cert  Cervicalgia - Plan: PT plan of care cert/re-cert  Muscle weakness (generalized) - Plan: PT plan of care cert/re-cert     Problem List Patient Active Problem List   Diagnosis Date Noted  . Adjustment disorder with depressed mood 05/07/2020  . Bunion 05/07/2020  . Complication of pregnancy, antepartum 05/07/2020  . Dermatitis 05/07/2020  . Gastroenteritis 05/07/2020  . Supervision of normal first pregnancy 05/07/2020  . Raynaud's phenomenon 05/07/2020  . Symptomatic anemia 10/05/2019  . Lab test positive for detection of COVID-19 virus 10/05/2019  . Uterine leiomyoma 04/16/2019  . 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 Family history of breast cancer   . Family history of colon cancer   . Family history of pancreatic cancer   . Fibrositis 11/07/2018  . Iron deficiency anemia 03/29/2018  . Elevated pulse rate 03/29/2018  . Menorrhagia 03/29/2018  . History of uterine fibroid 03/29/2018  . GERD (gastroesophageal reflux disease) 05/23/2017  . History of Helicobacter pylori infection 05/23/2017  . Nevus 09/29/2016  . Family history of breast cancer in female 05/28/2014    Izell [place], PT, DPT 06/23/2020, 7:46 PM  [facility] [address]  [place], [place], [address] Phone: [phone]   Fax:  [phone]  Name: YAJAYRA FELDT MRN: [id number] Date of Birth: 11/14/1980

## 2020-06-23 NOTE — Patient Instructions (Signed)
Access Code: [email or web address] URL: [email or web address] Date: 06/23/2020 Prepared by: Kathreen Cornfield  Exercises Open Books - 2 x daily - 7 x weekly - 1-2 sets - 10-15 reps Seated Thoracic Extension with Pectoralis Stretch - 2 x daily - 7 x weekly - 1-2 sets - 10-15 reps Reclined Diaphragmatic Breathing - 7 x weekly - 5 minutes hold Thoracic Extension Mobilization on Foam Roll - 2 x daily - 7 x weekly - 2 sets - 10 reps Open Book Chest Rotation Stretch on Foam 1/2 Roll - 2 x daily - 7 x weekly - 3 sets - 30 seconds hold

## 2020-06-24 ENCOUNTER — Other Ambulatory Visit: Payer: Self-pay

## 2020-06-24 ENCOUNTER — Ambulatory Visit: Payer: No Typology Code available for payment source | Admitting: Dermatology

## 2020-06-24 ENCOUNTER — Encounter: Payer: Self-pay | Admitting: Dermatology

## 2020-06-24 DIAGNOSIS — L814 Other melanin hyperpigmentation: Secondary | ICD-10-CM

## 2020-06-24 DIAGNOSIS — Z1283 Encounter for screening for malignant neoplasm of skin: Secondary | ICD-10-CM | POA: Diagnosis not present

## 2020-06-24 DIAGNOSIS — D492 Neoplasm of unspecified behavior of bone, soft tissue, and skin: Secondary | ICD-10-CM

## 2020-06-24 DIAGNOSIS — L820 Inflamed seborrheic keratosis: Secondary | ICD-10-CM

## 2020-06-24 DIAGNOSIS — L821 Other seborrheic keratosis: Secondary | ICD-10-CM

## 2020-06-24 DIAGNOSIS — Z86018 Personal history of other benign neoplasm: Secondary | ICD-10-CM | POA: Diagnosis not present

## 2020-06-24 DIAGNOSIS — D1800 Hemangioma unspecified site: Secondary | ICD-10-CM

## 2020-06-24 DIAGNOSIS — D2330 Other benign neoplasm of skin of unspecified part of face: Secondary | ICD-10-CM

## 2020-06-24 DIAGNOSIS — D229 Melanocytic nevi, unspecified: Secondary | ICD-10-CM

## 2020-06-24 DIAGNOSIS — D225 Melanocytic nevi of trunk: Secondary | ICD-10-CM

## 2020-06-24 DIAGNOSIS — D2272 Melanocytic nevi of left lower limb, including hip: Secondary | ICD-10-CM

## 2020-06-24 DIAGNOSIS — L219 Seborrheic dermatitis, unspecified: Secondary | ICD-10-CM

## 2020-06-24 DIAGNOSIS — L578 Other skin changes due to chronic exposure to nonionizing radiation: Secondary | ICD-10-CM

## 2020-06-24 DIAGNOSIS — D2339 Other benign neoplasm of skin of other parts of face: Secondary | ICD-10-CM

## 2020-06-24 MED ORDER — KETOCONAZOLE 2 % EX SHAM
1.0000 "application " | MEDICATED_SHAMPOO | CUTANEOUS | 11 refills | Status: DC
  Filled 2020-06-24: qty 120, 30d supply, fill #0

## 2020-06-24 NOTE — Progress Notes (Deleted)
   New Patient Visit  Subjective  Marissa Salazar is a 40 y.o. female who presents for the following: Total body skin exam (Hx of Dysplastic Nevus R infra axillary) and Irritating moles (Back, L axilla).  ***  The following portions of the chart were reviewed this encounter and updated as appropriate:       Review of Systems:  No other skin or systemic complaints except as noted in HPI or Assessment and Plan.  Objective  Well appearing patient in no apparent distress; mood and affect are within normal limits.  {[id number]::"Z full examination was performed including scalp, head, eyes, ears, nose, lips, neck, chest, axillae, abdomen, back, buttocks, bilateral upper extremities, bilateral lower extremities, hands, feet, fingers, toes, fingernails, and toenails. All findings within normal limits unless otherwise noted below."}  Objective  Right infra Axillary: Scar with no evidence of recurrence.    Assessment & Plan  History of dysplastic nevus Right infra Axillary  Clear. Observe for recurrence. Call clinic for new or changing lesions.  Recommend regular skin exams, daily broad-spectrum spf 30+ sunscreen use, and photoprotection.     No follow-ups on file.

## 2020-06-24 NOTE — Patient Instructions (Addendum)
If you have any questions or concerns for your doctor, please call our main line at [phone] and press option 4 to reach your doctor's medical assistant. If no one answers, please leave a voicemail as directed and we will return your call as soon as possible. Messages left after 4 pm will be answered the following business day.   You may also send us a message via MyChart. We typically respond to MyChart messages within 1-2 business days.  For prescription refills, please ask your pharmacy to contact our office. Our fax number is [phone].  If you have an urgent issue when the clinic is closed that cannot wait until the next business day, you can page your doctor at the number below.    Please note that while we do our best to be available for urgent issues outside of office hours, we are not available 24/7.   If you have an urgent issue and are unable to reach us, you may choose to seek medical care at your doctor's office, retail clinic, urgent care center, or emergency room.  If you have a medical emergency, please immediately call 911 or go to the emergency department.  Pager Numbers  - Dr. Kowalski: [phone]  - Dr. Moye: [phone]  - Dr. Stewart: [phone]  In the event of inclement weather, please call our main line at [phone] for an update on the status of any delays or closures.  Dermatology Medication Tips: Please keep the boxes that topical medications come in in order to help keep track of the instructions about where and how to use these. Pharmacies typically print the medication instructions only on the boxes and not directly on the medication tubes.   If your medication is too expensive, please contact our office at [phone] option 4 or send us a message through MyChart.   We are unable to tell what your co-pay for medications will be in advance as this is different depending on your insurance coverage. However, we may be able to find a  substitute medication at lower cost or fill out paperwork to get insurance to cover a needed medication.   If a prior authorization is required to get your medication covered by your insurance company, please allow us 1-2 business days to complete this process.  Drug prices often vary depending on where the prescription is filled and some pharmacies may offer cheaper prices.  The website www.goodrx.com contains coupons for medications through different pharmacies. The prices here do not account for what the cost may be with help from insurance (it may be cheaper with your insurance), but the website can give you the price if you did not use any insurance.  - You can print the associated coupon and take it with your prescription to the pharmacy.  - You may also stop by our office during regular business hours and pick up a GoodRx coupon card.  - If you need your prescription sent electronically to a different pharmacy, notify our office through [facility] MyChart or by phone at [phone] option 4.     Wound Care Instructions  1. Cleanse wound gently with soap and water once a day then pat dry with clean gauze. Apply a thing coat of Petrolatum (petroleum jelly, "Vaseline") over the wound (unless you have an allergy to this). We recommend that you use a new, sterile tube of Vaseline. Do not pick or remove scabs. Do not remove the yellow or white "healing tissue" from the base of the wound.    2. Cover the wound with fresh, clean, nonstick gauze and secure with paper tape. You may use Band-Aids in place of gauze and tape if the would is small enough, but would recommend trimming much of the tape off as there is often too much. Sometimes Band-Aids can irritate the skin.  3. You should call the office for your biopsy report after 1 week if you have not already been contacted.  4. If you experience any problems, such as abnormal amounts of bleeding, swelling, significant bruising, significant pain,  or evidence of infection, please call the office immediately.  5. FOR ADULT SURGERY PATIENTS: If you need something for pain relief you may take 1 extra strength Tylenol (acetaminophen) AND 2 Ibuprofen (200mg each) together every 4 hours as needed for pain. (do not take these if you are allergic to them or if you have a reason you should not take them.) Typically, you may only need pain medication for 1 to 3 days.     

## 2020-06-24 NOTE — Progress Notes (Addendum)
New Patient Visit  Subjective  Marissa Salazar is a 40 y.o. female who presents for the following: Total body skin exam (Hx of Dysplastic Nevus R infra axillary) and Irritating moles (Back, L axilla). The patient presents for Total-Body Skin Exam (TBSE) for skin cancer screening and mole check.  The following portions of the chart were reviewed this encounter and updated as appropriate:   Tobacco  Allergies  Meds  Problems  Med Hx  Surg Hx  Fam Hx     Review of Systems:  No other skin or systemic complaints except as noted in HPI or Assessment and Plan.  Objective  Well appearing patient in no apparent distress; mood and affect are within normal limits.  A full examination was performed including scalp, head, eyes, ears, nose, lips, neck, chest, axillae, abdomen, back, buttocks, bilateral upper extremities, bilateral lower extremities, hands, feet, fingers, toes, fingernails, and toenails. All findings within normal limits unless otherwise noted below.  Objective  Right infra Axillary: Scar with no evidence of recurrence.   Objective  Scalp: Pink patches with greasy scale.   Objective  L mid to upper back paraspinal above the braline: Flesh pap 0.6cm  Objective  Left Axilla: brown pap 0.4cm  Objective  Right Axilla: 0.5cm brown pap  Objective  mid dorsum nose: 1.54mm Flesh pap  Images    Objective  L foot x 2: Regular brown macules  Images      Objective  Neck: Erythematous keratotic or waxy stuck-on papule or plaque.    Assessment & Plan    Lentigines - Scattered tan macules - Due to sun exposure - Benign-appering, observe - Recommend daily broad spectrum sunscreen SPF 30+ to sun-exposed areas, reapply every 2 hours as needed. - Call for any changes  Seborrheic Keratoses - Stuck-on, waxy, tan-brown papules and/or plaques  - Benign-appearing - Discussed benign etiology and prognosis. - Observe - Call for any changes  Melanocytic  Nevi - Tan-brown and/or pink-flesh-colored symmetric macules and papules - Benign appearing on exam today - Observation - Call clinic for new or changing moles - Recommend daily use of broad spectrum spf 30+ sunscreen to sun-exposed areas.   Hemangiomas - Red papules - Discussed benign nature - Observe - Call for any changes  Actinic Damage - Chronic condition, secondary to cumulative UV/sun exposure - diffuse scaly erythematous macules with underlying dyspigmentation - Recommend daily broad spectrum sunscreen SPF 30+ to sun-exposed areas, reapply every 2 hours as needed.  - Staying in the shade or wearing long sleeves, sun glasses (UVA+UVB protection) and wide brim hats (4-inch brim around the entire circumference of the hat) are also recommended for sun protection.  - Call for new or changing lesions.  Skin cancer screening performed today.  History of dysplastic nevus Right infra Axillary  Clear. Observe for recurrence. Call clinic for new or changing lesions.  Recommend regular skin exams, daily broad-spectrum spf 30+ sunscreen use, and photoprotection.     Seborrheic dermatitis Scalp  Seborrheic Dermatitis  -  is a chronic persistent rash characterized by pinkness and scaling most commonly of the mid face but also can occur on the scalp (dandruff), ears; mid chest and mid back. It tends to be exacerbated by stress and cooler weather.  People who have neurologic disease may experience new onset or exacerbation of existing seborrheic dermatitis.  The condition is not curable but treatable and can be controlled.   Start Ketoconazole 2% shampoo 3-4x/wk let sit 5 minutes and rinse out  Start OTC Head and Shoulders 3-4x/wk on alternate days not using Ketoconazole shampoo  ketoconazole (NIZORAL) 2 % shampoo - Scalp  Neoplasm of skin (3) L mid to upper back paraspinal above the braline  Epidermal / dermal shaving  Lesion diameter (cm):  0.6 Informed consent: discussed and  consent obtained   Timeout: patient name, date of birth, surgical site, and procedure verified   Procedure prep:  Patient was prepped and draped in usual sterile fashion Prep type:  Isopropyl alcohol Anesthesia: the lesion was anesthetized in a standard fashion   Anesthetic:  1% lidocaine w/ epinephrine 1-100,000 buffered w/ 8.4% NaHCO3 Instrument used: flexible razor blade   Hemostasis achieved with: pressure, aluminum chloride and electrodesiccation   Outcome: patient tolerated procedure well   Post-procedure details: sterile dressing applied and wound care instructions given   Dressing type: bandage and petrolatum    Specimen 1 - Surgical pathology Differential Diagnosis: D48.5 Irritated Nevus r/o Atypia  Check Margins: No Flesh pap 0.6cm  Left Axilla  Epidermal / dermal shaving  Lesion diameter (cm):  0.4 Informed consent: discussed and consent obtained   Timeout: patient name, date of birth, surgical site, and procedure verified   Procedure prep:  Patient was prepped and draped in usual sterile fashion Prep type:  Isopropyl alcohol Anesthesia: the lesion was anesthetized in a standard fashion   Anesthetic:  1% lidocaine w/ epinephrine 1-100,000 buffered w/ 8.4% NaHCO3 Instrument used: flexible razor blade   Hemostasis achieved with: pressure, aluminum chloride and electrodesiccation   Outcome: patient tolerated procedure well   Post-procedure details: sterile dressing applied and wound care instructions given   Dressing type: bandage and petrolatum    Specimen 2 - Surgical pathology Differential Diagnosis: D48.5 Irritated Nevus r/o Atypia  Check Margins: No brown pap 0.4cm  Right Axilla  Epidermal / dermal shaving  Lesion diameter (cm):  0.5 Informed consent: discussed and consent obtained   Timeout: patient name, date of birth, surgical site, and procedure verified   Procedure prep:  Patient was prepped and draped in usual sterile fashion Prep type:  Isopropyl  alcohol Anesthesia: the lesion was anesthetized in a standard fashion   Anesthetic:  1% lidocaine w/ epinephrine 1-100,000 buffered w/ 8.4% NaHCO3 Instrument used: flexible razor blade   Hemostasis achieved with: pressure, aluminum chloride and electrodesiccation   Outcome: patient tolerated procedure well   Post-procedure details: sterile dressing applied and wound care instructions given   Dressing type: bandage and petrolatum    Specimen 3 - Surgical pathology Differential Diagnosis: D48.5 Irritated nevus r/o Atypia  Check Margins: No 0.5cm brown pap  Fibrous papule of face mid dorsum nose  Benign appearing, observe for changes  Nevus L foot x 2  Benign-appearing.  Observation.  Call clinic for new or changing moles.  Recommend daily use of broad spectrum spf 30+ sunscreen to sun-exposed areas.    Inflamed seborrheic keratosis Neck  Plan to txt with LN2 on f/u  Return in about 6 months (around 12/25/2020) for txt ISKs.  I, Othelia Pulling, RMA, am acting as scribe for Sarina Ser, MD .' Documentation: I have reviewed the above documentation for accuracy and completeness, and I agree with the above.  Sarina Ser, MD

## 2020-06-30 ENCOUNTER — Telehealth: Payer: Self-pay

## 2020-06-30 NOTE — Telephone Encounter (Signed)
-----   Message from Ralene Bathe, MD sent at 06/29/2020 10:38 AM EDT ----- Diagnosis 1. Skin , left mid to upper back paraspinal above the braline MELANOCYTIC NEVUS, INTRADERMAL TYPE, IRRITATED 2. Skin , left axilla SEBORRHEIC KERATOSIS, IRRITATED 3. Skin , right axilla MELANOCYTIC NEVUS, INTRADERMAL TYPE, IRRITATED  1&3 - benign irritated mole No further treatment needed 2- benign irritated keratosis No further treatment needed

## 2020-06-30 NOTE — Telephone Encounter (Signed)
Discussed biopsy results with pt  °

## 2020-07-02 ENCOUNTER — Ambulatory Visit: Payer: No Typology Code available for payment source | Admitting: Dermatology

## 2020-07-03 ENCOUNTER — Ambulatory Visit: Payer: No Typology Code available for payment source | Admitting: Physical Therapy

## 2020-07-03 ENCOUNTER — Other Ambulatory Visit: Payer: Self-pay

## 2020-07-03 ENCOUNTER — Encounter: Payer: Self-pay | Admitting: Dermatology

## 2020-07-03 DIAGNOSIS — R293 Abnormal posture: Secondary | ICD-10-CM

## 2020-07-03 DIAGNOSIS — M6281 Muscle weakness (generalized): Secondary | ICD-10-CM

## 2020-07-03 DIAGNOSIS — M542 Cervicalgia: Secondary | ICD-10-CM

## 2020-07-03 NOTE — Therapy (Addendum)
[facility] [place], [place], [address] Phone: [phone]   Fax:  [phone]  Physical Therapy Treatment / Discharge note  Patient Details  Name: Marissa Salazar MRN: [id number] Date of Birth: [birth year] Referring Provider (PT): Glori Bickers, [place]   Encounter Date: 07/03/2020   PT End of Session - 07/03/20 0802     Visit Number 2    Number of Visits 12    Date for PT Re-Evaluation 08/18/20    PT Start Time 0800    PT Stop Time 0848    PT Time Calculation (min) 48 min    Activity Tolerance Patient tolerated treatment well             Past Medical History:  Diagnosis Date   Abnormal Pap smear    in past; normal colpo   Allergic rhinitis    Allergy    Anemia    Hx fibroids   Blood transfusion without reported diagnosis 05/2019   Dysplastic nevus 10/05/2016   R infra axillary, mild atypia   Family history of breast cancer    Family history of colon cancer    Family history of pancreatic cancer    Fibroid    Iron deficiency anemia due to chronic blood loss    Superficial vein thrombosis 2019   Left leg   Uterine fibroid 05/09/2019   sonohysterogram showing 7.8 x 7.4 cm fibroid with 4.6 cm in endometrial cavity - Dr. [name]    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   BUNIONECTOMY Right 2013   COLPOSCOPY  2009   ESOPHAGOGASTRODUODENOSCOPY N/A 07/17/2015   Procedure: ESOPHAGOGASTRODUODENOSCOPY (EGD);  Surgeon: Manya Silvas, MD;  Location: [facility] ENDOSCOPY;  Service: Endoscopy;  Laterality: N/A;    There were no vitals filed for this visit.   Subjective Assessment - 07/03/20 0913     Subjective The patient reports that she has been doing her exercises at home.  She is feeling pretty today.  Her pain does get aggravated with sleeping on the right side.  She would like to proceed with dry needling today.    Limitations Lifting;Other (comment)    sleeping   Patient Stated Goals To decrease the knotts and pain in the neck.    Currently in Pain? Yes    Pain Score 4     Pain Location Neck    Pain Descriptors / Indicators Aching;Sharp;Shooting;Tightness    Aggravating Factors  Sleeping, running, and lifting                               [facility] Adult PT Treatment/Exercise - 07/03/20 0001       Shoulder Exercises: Standing   External Rotation 15 reps;Theraband    Theraband Level (Shoulder External Rotation) Level 3 (Green)   5 sec hold   Retraction 20 reps;Theraband   5 s hold   Theraband Level (Shoulder Retraction) Level 3 (Green)      Manual Therapy   Soft tissue mobilization IASTM Bilat UTs, Levator Scapula      Neck Exercises: Stretches   Upper Trapezius Stretch 30 seconds;3 reps    Levator Stretch 30 seconds;3 reps              Trigger Point Dry Needling - 07/03/20 0001     Consent Given? Yes   Pt education FDN prior to consent   Upper  Trapezius Response Twitch reponse elicited   B UTs in prone   Levator Scapulae Response Twitch response elicited   Bilateral in prone                 PT Education - 07/03/20 0850     Education Details Patient education on Foto score and goals, Educated patient on FDN, benefits and side affects, Patient education on pillow use for sleeping in side lying, patient education on Rocking taping home management and removal.  HEP updated.  [id number]    Person(s) Educated Patient    Methods Explanation;Demonstration;Handout;Verbal cues    Comprehension Verbalized understanding;Returned demonstration              PT Short Term Goals - 06/23/20 1803       PT SHORT TERM GOAL #1   Title Pt will be independent and compliant with initial HEP.    Baseline provided at initial evaluation    Time 3    Period Weeks    Status New    Target Date 07/14/20      PT SHORT TERM GOAL #2   Title FOTO will be taken by visit 3 with LTG created.    Time 3    Period  Weeks    Status New    Target Date 07/14/20      PT SHORT TERM GOAL #3   Title DNF baseline testing will be assessed by visit 3 with LTG created.    Time 3    Period Weeks    Status New    Target Date 07/14/20               PT Long Term Goals - 06/23/20 1937       PT LONG TERM GOAL #1   Title Pt will be indepednent with advanced HEP and progressions for long term maintenacne.    Time 8    Period Weeks    Status New    Target Date 08/18/20      PT LONG TERM GOAL #2   Title Pt will report no limitation due to neck/upper back and <2/10 pain with exercise.    Baseline recently had to take 2 weeks off due to pain    Time 8    Period Weeks    Status New    Target Date 08/18/20      PT LONG TERM GOAL #3   Title Pt will understand and verbalize techniques to manage tightness and limiting pain in neck/upper shoulder.    Time 8    Period Weeks    Status New    Target Date 08/18/20                   Plan - 07/03/20 0859     Clinical Impression Statement Wells Guiles arrived to therapy reporting 4/10 pain in the neck/UTs with the right being worse then the left.  She reports compliance wiht her HEP.  Foto was completed and the patient wad educated on her results.  The patient was educated on [facility] and the patient gave consent to proceed.  FDN was performed today on the bilateral UTs and Levator scapula.  The patient did have the twitch response.  IASTM and Rocktaping was performed post the dry needling.  As well as stretches of UT and levator scapula.  The patients HEP was progressed with scapular stabilization exercises.    PT Treatment/Interventions ADLs/Self Care Home Management;Cryotherapy;Electrical Stimulation;Iontophoresis 60m/ml Dexamethasone;Moist Heat;Traction;Functional mobility training;Therapeutic activities;Therapeutic exercise;Neuromuscular re-education;Patient/family  education;Manual techniques;Passive range of motion;Dry needling;Taping;Spinal  Manipulations;Joint Manipulations    PT Next Visit Plan Assess response to FDN, Review new HEP, progress periscapular/upper back strengthening, thoracic mobility, DNF,    PT Home Exercise Plan Access Code: [email or web address]              Patient will benefit from skilled therapeutic intervention in order to improve the following deficits and impairments:  Pain,Improper body mechanics,Postural dysfunction,Increased muscle spasms,Impaired flexibility,Decreased range of motion,Decreased strength,Impaired perceived functional ability,Decreased mobility,Increased fascial restricitons  Visit Diagnosis: Cervicalgia  Abnormal posture  Muscle weakness (generalized)     Problem List Patient Active Problem List   Diagnosis Date Noted   Adjustment disorder with depressed mood 05/07/2020   Bunion 54/36/0677   Complication of pregnancy, antepartum 05/07/2020   Dermatitis 05/07/2020   Gastroenteritis 05/07/2020   Supervision of normal first pregnancy 05/07/2020   Raynaud's phenomenon 05/07/2020   Symptomatic anemia 10/05/2019   Lab test positive for detection of COVID-19 virus 10/05/2019   Uterine leiomyoma 03/40/3524   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 DN Bethena Midget 07/03/2020, 9:21 AM  [facility] [address] [place], [place], [address] Phone: [phone]   Fax:  [phone]  Name: JACLYNNE BALDO MRN: [id number] Date of Birth:  Jun 05, 1980     PHYSICAL THERAPY DISCHARGE SUMMARY  Visits from Start of Care: 2  Current functional level related to goals / functional outcomes: See goals   Remaining deficits: Current status unknown   Education / Equipment: HEP   Patient agrees to discharge. Patient goals were not met. Patient is being discharged due to not returning since the last visit.  Kristoffer Leamon PT, DPT, LAT, ATC  09/10/20  11:05 AM

## 2020-07-03 NOTE — Patient Instructions (Signed)
Access Code: [email or web address]  URL: [email or web address]  Date: 07/03/2020  Prepared by: Rich Number    Exercises  Open Books - 2 x daily - 7 x weekly - 1-2 sets - 10-15 reps  Seated Thoracic Extension with Pectoralis Stretch - 2 x daily - 7 x weekly - 1-2 sets - 10-15 reps  Reclined Diaphragmatic Breathing - 7 x weekly - 5 minutes hold  Thoracic Extension Mobilization on Foam Roll - 2 x daily - 7 x weekly - 2 sets - 10 reps  Open Book Chest Rotation Stretch on Foam 1/2 Roll - 2 x daily - 7 x weekly - 3 sets - 30 seconds hold  Shoulder extension with resistance - Neutral - 1 x daily - 7 x weekly - 2 sets - 10 reps - 5 hold  Shoulder External Rotation and Scapular Retraction with Resistance - 1 x daily - 7 x weekly - 2 sets - 10 reps - 5 hold

## 2020-07-09 ENCOUNTER — Ambulatory Visit: Payer: No Typology Code available for payment source

## 2020-07-11 ENCOUNTER — Encounter: Payer: Self-pay | Admitting: Dermatology

## 2020-07-13 ENCOUNTER — Other Ambulatory Visit: Payer: Self-pay

## 2020-07-13 ENCOUNTER — Ambulatory Visit: Payer: No Typology Code available for payment source | Admitting: Physical Therapy

## 2020-07-14 NOTE — Addendum Note (Signed)
Addended by: Ralene Bathe on: 07/14/2020 10:51 AM   Modules accepted: Level of Service

## 2020-07-15 ENCOUNTER — Other Ambulatory Visit: Payer: Self-pay

## 2020-07-15 ENCOUNTER — Ambulatory Visit: Payer: No Typology Code available for payment source | Admitting: Dermatology

## 2020-07-15 DIAGNOSIS — D229 Melanocytic nevi, unspecified: Secondary | ICD-10-CM

## 2020-07-15 DIAGNOSIS — D2239 Melanocytic nevi of other parts of face: Secondary | ICD-10-CM | POA: Diagnosis not present

## 2020-07-15 MED ORDER — MOMETASONE FUROATE 0.1 % EX CREA
1.0000 "application " | TOPICAL_CREAM | CUTANEOUS | 0 refills | Status: DC
  Filled 2020-07-15: qty 45, 25d supply, fill #0

## 2020-07-15 NOTE — Patient Instructions (Addendum)
If you have any questions or concerns for your doctor, please call our main line at [phone] and press option 4 to reach your doctor's medical assistant. If no one answers, please leave a voicemail as directed and we will return your call as soon as possible. Messages left after 4 pm will be answered the following business day.   You may also send [place] a message via [place]. We typically respond to MyChart messages within 1-2 business days.  For prescription refills, please ask your pharmacy to contact our office. Our fax number is [phone].  If you have an urgent issue when the clinic is closed that cannot wait until the next business day, you can page your doctor at the number below.    Please note that while we do our best to be available for urgent issues outside of office hours, we are not available 24/7.   If you have an urgent issue and are unable to reach [place], you may choose to seek medical care at your doctor's office, retail clinic, urgent care center, or emergency room.  If you have a medical emergency, please immediately call 911 or go to the emergency department.  Pager Numbers  - Dr. Nehemiah Massed: [phone]  - Dr. Laurence Ferrari: [phone]  - Dr. Nicole Kindred: [phone]  In the event of inclement weather, please call our main line at [phone] for an update on the status of any delays or closures.  Dermatology Medication Tips: Please keep the boxes that topical medications come in in order to help keep track of the instructions about where and how to use these. Pharmacies typically print the medication instructions only on the boxes and not directly on the medication tubes.   If your medication is too expensive, please contact our office at [phone] option 4 or send [place] a message through [place].   We are unable to tell what your co-pay for medications will be in advance as this is different depending on your insurance coverage. However, we may be able to find a substitute  medication at lower cost or fill out paperwork to get insurance to cover a needed medication.   If a prior authorization is required to get your medication covered by your insurance company, please allow [place] 1-2 business days to complete this process.  Drug prices often vary depending on where the prescription is filled and some pharmacies may offer cheaper prices.  The website www.goodrx.com contains coupons for medications through different pharmacies. The prices here do not account for what the cost may be with help from insurance (it may be cheaper with your insurance), but the website can give you the price if you did not use any insurance.  - You can print the associated coupon and take it with your prescription to the pharmacy.  - You may also stop by our office during regular business hours and pick up a GoodRx coupon card.  - If you need your prescription sent electronically to a different pharmacy, notify our office through [facility] or by phone at [phone] option 4.   Melanoma ABCDEs  Melanoma is the most dangerous type of skin cancer, and is the leading cause of death from skin disease.  You are more likely to develop melanoma if you:  Have light-colored skin, light-colored eyes, or red or blond hair  Spend a lot of time in the sun  Tan regularly, either outdoors or in a tanning bed  Have had blistering sunburns, especially during childhood  Have a close  family member who has had a melanoma  Have atypical moles or large birthmarks  Early detection of melanoma is key since treatment is typically straightforward and cure rates are extremely high if we catch it early.   The first sign of melanoma is often a change in a mole or a new dark spot.  The ABCDE system is a way of remembering the signs of melanoma.  A for asymmetry:  The two halves do not match. B for border:  The edges of the growth are irregular. C for color:  A mixture of colors are present instead  of an even brown color. D for diameter:  Melanomas are usually (but not always) greater than 18mm - the size of a pencil eraser. E for evolution:  The spot keeps changing in size, shape, and color.  Please check your skin once per month between visits. You can use a small mirror in front and a large mirror behind you to keep an eye on the back side or your body.   If you see any new or changing lesions before your next follow-up, please call to schedule a visit.  Please continue daily skin protection including broad spectrum sunscreen SPF 30+ to sun-exposed areas, reapplying every 2 hours as needed when you're outdoors.   Staying in the shade or wearing long sleeves, sun glasses (UVA+UVB protection) and wide brim hats (4-inch brim around the entire circumference of the hat) are also recommended for sun protection.

## 2020-07-15 NOTE — Progress Notes (Signed)
   Follow-Up Visit   Subjective  Marissa Salazar is a 40 y.o. female who presents for the following: irritated moles (Face, 2wks, swelling, itching, pt had a facial and red light therapy and not sure if that irritated them).  The following portions of the chart were reviewed this encounter and updated as appropriate:   Tobacco  Allergies  Meds  Problems  Med Hx  Surg Hx  Fam Hx      Review of Systems:  No other skin or systemic complaints except as noted in HPI or Assessment and Plan.  Objective  Well appearing patient in no apparent distress; mood and affect are within normal limits.  A focused examination was performed including face. Relevant physical exam findings are noted in the Assessment and Plan.  L med cheek; R lat cheek; Right Lateral Canthus  L med cheek 0.6cm brown pap  R lat cheek 0.6cm brown pap  Right Lateral [place]hus 0.5cm flesh pap          Assessment & Plan  Irritated nevus (3) -secondary to irritant contact dermatitis from recent facial and red light therapy. L med cheek; R lat cheek; Right Lateral [place]hus  Recommend starting Mometasone cr qd.bid aa face up to 2 weeks, then d/c  Then after calmed down, at least 3-4 weeks, can plan bx/shave removal Discussed with shave removal can repigment since the goal is removing the elevated portion of the mole and does not necessarily eradicate all the nevus cells.  Discussed excision/removal with plastic surgeon versus shave removal.  Advise that shave removal does not preclude excision in the future if she is not happy with the resulting healing.  Topical steroids (such as triamcinolone, fluocinolone, fluocinonide, mometasone, clobetasol, halobetasol, betamethasone, hydrocortisone) can cause thinning and lightening of the skin if they are used for too long in the same area. Your physician has selected the right strength medicine for your problem and area affected on the body. Please use your medication  only as directed by your physician to prevent side effects.    Related Medications mometasone (ELOCON) 0.1 % cream Apply to affected area as directed daily to twice a day to face until irritation resolved, may use up to 2 weeks then d/c  Return in about 3 weeks (around 08/05/2020) for 3-4 wks for bx/shave removal.   I, Sonya Hupman, RMA, am acting as scribe for Sarina Ser, MD . Documentation: I have reviewed the above documentation for accuracy and completeness, and I agree with the above.  Sarina Ser, MD

## 2020-07-16 ENCOUNTER — Ambulatory Visit: Payer: No Typology Code available for payment source

## 2020-07-17 ENCOUNTER — Encounter: Payer: Self-pay | Admitting: Dermatology

## 2020-07-20 ENCOUNTER — Ambulatory Visit (INDEPENDENT_AMBULATORY_CARE_PROVIDER_SITE_OTHER): Payer: No Typology Code available for payment source | Admitting: Dermatology

## 2020-07-20 ENCOUNTER — Telehealth: Payer: Self-pay | Admitting: Physical Therapy

## 2020-07-20 ENCOUNTER — Ambulatory Visit: Payer: No Typology Code available for payment source | Attending: Physician Assistant | Admitting: Physical Therapy

## 2020-07-20 ENCOUNTER — Telehealth: Payer: Self-pay | Admitting: Physician Assistant

## 2020-07-20 ENCOUNTER — Other Ambulatory Visit: Payer: Self-pay

## 2020-07-20 DIAGNOSIS — D2239 Melanocytic nevi of other parts of face: Secondary | ICD-10-CM

## 2020-07-20 DIAGNOSIS — D229 Melanocytic nevi, unspecified: Secondary | ICD-10-CM

## 2020-07-20 DIAGNOSIS — D2271 Melanocytic nevi of right lower limb, including hip: Secondary | ICD-10-CM

## 2020-07-20 NOTE — Progress Notes (Signed)
   Follow-Up Visit   Subjective  Marissa Salazar is a 40 y.o. female who presents for the following: Follow-up (Patient here to have irritated nevi of the face rechecked. She had a facial with a red light therapy on May 12, which irritated and made moles larger. She tried mometasone cream as directed, but was too irritating and burns. No hx of drainage from these areas. She would like a second opinion. She also has a new mole on her right medial thigh she would like checked.). She also has a small bump on her nose that came up around 1 year ago. She treats with tea tree oil     The following portions of the chart were reviewed this encounter and updated as appropriate:        Review of Systems:  No other skin or systemic complaints except as noted in HPI or Assessment and Plan.  Objective  Well appearing patient in no apparent distress; mood and affect are within normal limits.  A focused examination was performed including face, right thigh. Relevant physical exam findings are noted in the Assessment and Plan.  Left Medial Cheek, Right cheek Flesh/brown papules.  See previous visit with photos  R medial thigh 2.14mm regular brown macule  Nasal Dorsum 1.68mm pink firm papule   Assessment & Plan  Nevus (2) R medial thigh; Left Medial Cheek, Right cheek  Benign-appearing. Hx of irritation on face.  With possible cyst within nevus of the left medial cheek. Explained that facial moles may swell off and on if there is an associated cyst.  Discussed observation vs shave removal since irritated. Discussed resulting small scar with shave removal, and possible recurrence of lesion. Patient prefers to observe for now. May consider removal in the future.    Recommend observation.  Call clinic for new or changing moles.  Recommend daily use of broad spectrum spf 30+ sunscreen to sun-exposed areas. ABCDEs of mole observation discussed.  RTC if any changes noted.      Start Eucrisa ointment  qd/bid prn irritation. Sample given.   Fibrous papule of nose Nasal Dorsum  Inflamed.  Benign, observe.   Discussed shave removal if grows/worsens.   Return as scheduled with Dr Raliegh Ip..  I, Jamesetta Orleans, CMA, am acting as scribe for Brendolyn Patty, MD .  Documentation: I have reviewed the above documentation for accuracy and completeness, and I agree with the above.  Brendolyn Patty MD

## 2020-07-20 NOTE — Telephone Encounter (Signed)
Patient states at home covid test is negative. Pt scheduled for tomorrow at 1pm. AS, CMA

## 2020-07-20 NOTE — Telephone Encounter (Signed)
Patient has been experiencing sinus congestion and was advised to take at home COVID test as she has not taken one. Please advise, thanks.

## 2020-07-20 NOTE — Patient Instructions (Addendum)

## 2020-07-20 NOTE — Telephone Encounter (Signed)
Spoke with pt regarding missed appointment today. She states she was unable to leave work early and was unable to call and let us know. She confirmed that she is coming to her next appointment. Informed that if she cannot make her appointment to please call and we can work to cancel or reschedule that for her.   Stormee Duda PT, DPT, LAT, ATC  07/20/20  5:03 PM

## 2020-07-21 ENCOUNTER — Emergency Department (HOSPITAL_BASED_OUTPATIENT_CLINIC_OR_DEPARTMENT_OTHER): Payer: No Typology Code available for payment source

## 2020-07-21 ENCOUNTER — Ambulatory Visit: Payer: No Typology Code available for payment source | Admitting: Physician Assistant

## 2020-07-21 ENCOUNTER — Other Ambulatory Visit: Payer: Self-pay

## 2020-07-21 ENCOUNTER — Emergency Department (HOSPITAL_BASED_OUTPATIENT_CLINIC_OR_DEPARTMENT_OTHER)
Admission: EM | Admit: 2020-07-21 | Discharge: 2020-07-21 | Disposition: A | Discharge status: Home / Self Care | Payer: No Typology Code available for payment source | Attending: Emergency Medicine | Admitting: Emergency Medicine

## 2020-07-21 ENCOUNTER — Encounter (HOSPITAL_BASED_OUTPATIENT_CLINIC_OR_DEPARTMENT_OTHER): Payer: Self-pay | Admitting: *Deleted

## 2020-07-21 DIAGNOSIS — J039 Acute tonsillitis, unspecified: Secondary | ICD-10-CM | POA: Diagnosis not present

## 2020-07-21 DIAGNOSIS — R519 Headache, unspecified: Secondary | ICD-10-CM | POA: Diagnosis not present

## 2020-07-21 DIAGNOSIS — Z8616 Personal history of COVID-19: Secondary | ICD-10-CM | POA: Insufficient documentation

## 2020-07-21 DIAGNOSIS — J019 Acute sinusitis, unspecified: Secondary | ICD-10-CM | POA: Insufficient documentation

## 2020-07-21 DIAGNOSIS — R0981 Nasal congestion: Secondary | ICD-10-CM | POA: Diagnosis present

## 2020-07-21 DIAGNOSIS — J329 Chronic sinusitis, unspecified: Secondary | ICD-10-CM

## 2020-07-21 MED ORDER — OXYMETAZOLINE HCL 0.05 % NA SOLN
1.0000 | Freq: Once | NASAL | Status: AC
  Administered 2020-07-21: 1 via NASAL
  Filled 2020-07-21: qty 30

## 2020-07-21 MED ORDER — AMOXICILLIN-POT CLAVULANATE 875-125 MG PO TABS: 1.0000 | ORAL_TABLET | Freq: Two times a day (BID) | ORAL | 0 refills | Status: DC

## 2020-07-21 NOTE — ED Triage Notes (Signed)
Facial and neck pain x 2 weeks.  Pt has swallowing difficulty.  Has been nauseated x 2 days.

## 2020-07-21 NOTE — ED Notes (Signed)
Patient states she does not feel ready for discharge and thinks "something else is going on with my head". 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

## 2020-07-21 NOTE — ED Provider Notes (Addendum)
[facility] EMERGENCY DEPT Provider Note   CSN: [id number] Arrival date & time: 07/21/20  0160     History No chief complaint on file.   Marissa Salazar is a 40 y.o. female.  HPI 40 year old female presents today complaining of nasal congestion, facial pressure and pain, enlarged lymph nodes in the neck, pain to right side of neck and pain with swallowing.  She has had symptoms for about 2 weeks and started with the nasal congestion predominantly.  She has had a negative COVID test.  She has had 2 COVID vaccines.  She has used saline washes and over-the-counter medications.  She took 3 days of Keflex but continues to have pain and pressure in her face.  She denies allergies to any medications.  She states she has been eating and drinking without difficulty.  She has had some nausea but no vomiting.  She has had some diarrhea.  She has not had fever.     Past Medical History:  Diagnosis Date   Abnormal Pap smear    in past; normal colpo   Allergic rhinitis    Allergy    Anemia    Hx fibroids   Blood transfusion without reported diagnosis 05/2019   Dysplastic nevus 10/05/2016   R infra axillary, mild atypia   Family history of breast cancer    Family history of colon cancer    Family history of pancreatic cancer    Fibroid    Iron deficiency anemia due to chronic blood loss    Superficial vein thrombosis 2019   Left leg   Uterine fibroid 05/09/2019   sonohysterogram showing 7.8 x 7.4 cm fibroid with 4.6 cm in endometrial cavity - Dr. 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   Family history of breast cancer    Family history of colon cancer    Family history of pancreatic cancer    Fibrositis 11/07/2018   Iron deficiency anemia 03/29/2018   Elevated pulse rate 03/29/2018   Menorrhagia 03/29/2018   History of uterine fibroid 03/29/2018   GERD (gastroesophageal reflux disease) 27/07/2374   History of Helicobacter pylori infection 05/23/2017   Nevus 09/29/2016   Family history of breast cancer in female 05/28/2014    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   BUNIONECTOMY Right 2013   COLPOSCOPY  2009   ESOPHAGOGASTRODUODENOSCOPY N/A 07/17/2015   Procedure: ESOPHAGOGASTRODUODENOSCOPY (EGD);  Surgeon: Manya Silvas, MD;  Location: [facility] ENDOSCOPY;  Service: Endoscopy;  Laterality: N/A;     OB History     Gravida  2   Para  2   Term  2   Preterm  0   AB  0   Living  2      SAB  0   IAB  0   Ectopic  0   Multiple  0   Live Births  1        Obstetric Comments  1st Menstrual Cycle: 13  1st Pregnancy:  24  Family History  Problem Relation Age of Onset   Breast cancer Maternal Aunt 22       BRCA negative   Colon cancer Maternal Grandmother 50   Breast cancer Maternal Grandmother 54   Hypertension Maternal Grandmother    Breast cancer Other        GP   Lung cancer Other        GP   Diabetes Paternal Uncle    Heart attack Maternal Uncle    Pancreatic cancer Maternal Aunt 44    Social History   Tobacco Use   Smoking status: Never   Smokeless tobacco: Never  Vaping Use   Vaping Use: Never used  Substance Use Topics   Alcohol use: Yes    Alcohol/week: 0.0 standard drinks    Comment: Rare   Drug use: No    Home Medications Prior to Admission medications   Medication Sig Start Date End Date Taking? Authorizing Provider  cholecalciferol (VITAMIN D3) 25 MCG (1000 UNIT) tablet Take  1,000 Units by mouth daily.    [provider]  cyclobenzaprine (FLEXERIL) 10 MG tablet Take 1 tablet (10 mg total) by mouth 3 (three) times daily as needed for muscle spasms. 05/21/20   Lisette Abu, PA-C  ferrous sulfate 325 (65 FE) MG tablet Take 1 tablet (325 mg total) by mouth daily with breakfast. 01/01/20   Orson Slick, MD  ketoconazole (NIZORAL) 2 % shampoo Apply 1 application topically as directed. Wash scalp 3-4 days a week, let sit 5 minutes and rinse out 06/24/20   Ralene Bathe, MD  mometasone (ELOCON) 0.1 % cream Apply to affected area as directed daily to twice a day to face until irritation resolved, may use up to 2 weeks then d/c 07/15/20   Ralene Bathe, MD  Multiple Vitamin (MULTIVITAMIN) tablet Take 1 tablet by mouth daily.    [provider]  naproxen (NAPROSYN) 500 MG tablet Take 1 tablet (500 mg total) by mouth 2 (two) times daily with a meal. 05/21/20   Lisette Abu, PA-C  zolpidem (AMBIEN) 10 MG tablet TAKE 1 TABLET BY MOUTH AT BEDTIME AS NEEDED FOR INSOMNIA 01/06/20 07/04/20  [name], MD    Allergies    Ivp dye [iodinated diagnostic agents]  Review of Systems   Review of Systems  All other systems reviewed and are negative.  Physical Exam Updated Vital Signs There were no vitals taken for this visit.  Physical Exam Vitals and nursing note reviewed.  Constitutional:      General: She is not in acute distress.    Appearance: Normal appearance.  HENT:     Head: Normocephalic.     Right Ear: Tympanic membrane, ear canal and external ear normal.     Left Ear: Tympanic membrane, ear canal and external ear normal.     Mouth/Throat:     Mouth: Mucous membranes are moist.     Pharynx: Oropharynx is clear. No oropharyngeal exudate or posterior oropharyngeal erythema.  Eyes:     Extraocular Movements: Extraocular movements intact.     Conjunctiva/sclera: Conjunctivae normal.     Pupils: Pupils are equal, round, and reactive  to light.  Cardiovascular:     Rate and Rhythm: Normal rate and regular rhythm.     Pulses: Normal pulses.     Heart sounds: Normal heart sounds.  Pulmonary:     Effort: Pulmonary effort is normal.     Breath sounds: Normal breath sounds.  Abdominal:  General: Abdomen is flat.     Palpations: Abdomen is soft.  Musculoskeletal:        General: Normal range of motion.     Cervical back: Normal range of motion.  Skin:    General: Skin is warm and dry.     Capillary Refill: Capillary refill takes less than 2 seconds.  Neurological:     General: No focal deficit present.     Mental Status: She is alert.  Psychiatric:        Mood and Affect: Mood normal.    ED Results / Procedures / Treatments   Labs (all labs ordered are listed, but only abnormal results are displayed) Labs Reviewed - No data to display  EKG None  Radiology CT Head Wo Contrast  Result Date: 07/21/2020 CLINICAL DATA:  Headache with sinus pain. EXAM: CT HEAD WITHOUT CONTRAST CT MAXILLOFACIAL WITHOUT CONTRAST TECHNIQUE: Multidetector CT imaging of the head and maxillofacial structures were performed using the standard protocol without intravenous contrast. Multiplanar CT image reconstructions of the maxillofacial structures were also generated. COMPARISON:  CT head July 20, 2015. FINDINGS: CT HEAD FINDINGS Brain: No evidence of acute large vascular territory infarction, hemorrhage, hydrocephalus, extra-axial collection or mass lesion/mass effect. Vascular: No hyperdense vessel identified. Mild calcific atherosclerosis. Skull: No acute fracture. Other: No mastoid effusions. CT MAXILLOFACIAL FINDINGS Osseous: No fracture or mandibular dislocation. No destructive process. Orbits: Negative. No traumatic or inflammatory finding. Sinuses: Clear sinuses. Soft tissues: Negative.  See CT neck for evaluation of the neck. IMPRESSION: 1. No evidence of acute intracranial abnormality. 2. Clear sinuses. Electronically Signed   By:  Margaretha Sheffield MD   On: 07/21/2020 10:08   CT Soft Tissue Neck Wo Contrast  Result Date: 07/21/2020 CLINICAL DATA:  Neck pain. EXAM: CT NECK WITHOUT CONTRAST TECHNIQUE: Multidetector CT imaging of the neck was performed following the standard protocol without intravenous contrast. COMPARISON:  None. FINDINGS: Pharynx and larynx: Prominent bilateral palatine and lingual tonsils with partial effacement of the vallecula. Salivary glands: No inflammation, mass, or stone. Thyroid: Unremarkable nonenhanced appearance. Lymph nodes: None enlarged or abnormal density. Mildly prominent but nonenlarged jugulodigastric nodes bilaterally. Vascular: Nondiagnostic evaluation without contrast. Limited intracranial: Further evaluated on same day CT head. Visualized orbits: Negative. Visualized mastoids and paranasal sinuses: Clear Skeleton: No acute or aggressive process. Upper chest: Biapical pleuroparenchymal scarring. Otherwise, clear visualized lung apices. IMPRESSION: Prominent bilateral palatine and lingual tonsils with partial effacement of the vallecula. Findings are nonspecific, but may reflect tonsillitis or hypertrophy. Recommend correlation with direct inspection. Electronically Signed   By: Margaretha Sheffield MD   On: 07/21/2020 10:01   CT Maxillofacial Wo Contrast  Result Date: 07/21/2020 CLINICAL DATA:  Headache with sinus pain. EXAM: CT HEAD WITHOUT CONTRAST CT MAXILLOFACIAL WITHOUT CONTRAST TECHNIQUE: Multidetector CT imaging of the head and maxillofacial structures were performed using the standard protocol without intravenous contrast. Multiplanar CT image reconstructions of the maxillofacial structures were also generated. COMPARISON:  CT head July 20, 2015. FINDINGS: CT HEAD FINDINGS Brain: No evidence of acute large vascular territory infarction, hemorrhage, hydrocephalus, extra-axial collection or mass lesion/mass effect. Vascular: No hyperdense vessel identified. Mild calcific atherosclerosis.  Skull: No acute fracture. Other: No mastoid effusions. CT MAXILLOFACIAL FINDINGS Osseous: No fracture or mandibular dislocation. No destructive process. Orbits: Negative. No traumatic or inflammatory finding. Sinuses: Clear sinuses. Soft tissues: Negative.  See CT neck for evaluation of the neck. IMPRESSION: 1. No evidence of acute intracranial abnormality. 2. Clear sinuses. Electronically Signed   By:  Margaretha Sheffield MD   On: 07/21/2020 10:08    Procedures Procedures   Medications Ordered in ED Medications - No data to display  ED Course  I have reviewed the triage vital signs and the nursing notes.  Pertinent labs & imaging results that were available during my care of the patient were reviewed by me and considered in my medical decision making (see chart for details).    MDM Rules/Calculators/A&P                          40 year old female presents today with nasal congestion with increasing pressure over the past 4 weeks.  She has had some chills, nausea, no fever.  Given previous antibiotic that she has taken, plan Augmentin twice daily for 7 days.  She does have primary care to follow-up with.  8:58 AM Patient states that she does not feel that she can be discharged at this time.  She feels like "something else is going on".  She gets sores in her nose.  Voices concern regarding abscess or other infection.  Plan CT head and neck and lumbar puncture if these are negative.CT results  reviewed and discussed with patient.  CT does not show evidence of sinusitis but does show some tonsillar enlargement.  I discussed return precautions such as difficulty swallowing, speaking, or breathing.  Otherwise we will continue with the course of Augmentin. Final Clinical Impression(s) / ED Diagnoses Final diagnoses:  Sinusitis, unspecified chronicity, unspecified location  Tonsillitis    Rx / DC Orders ED Discharge Orders     None        Pattricia Boss, MD 07/21/20 [id number]    Pattricia Boss,  MD 07/21/20 1022

## 2020-07-22 ENCOUNTER — Ambulatory Visit: Payer: No Typology Code available for payment source | Admitting: Physical Therapy

## 2020-07-27 ENCOUNTER — Encounter: Payer: No Typology Code available for payment source | Admitting: Physical Therapy

## 2020-07-29 ENCOUNTER — Encounter: Payer: No Typology Code available for payment source | Admitting: Physical Therapy

## 2020-07-31 ENCOUNTER — Other Ambulatory Visit: Payer: Self-pay

## 2020-07-31 ENCOUNTER — Telehealth: Payer: Self-pay | Admitting: Physician Assistant

## 2020-07-31 ENCOUNTER — Encounter: Payer: Self-pay | Admitting: Nurse Practitioner

## 2020-07-31 ENCOUNTER — Ambulatory Visit (INDEPENDENT_AMBULATORY_CARE_PROVIDER_SITE_OTHER): Payer: No Typology Code available for payment source | Admitting: Nurse Practitioner

## 2020-07-31 VITALS — BP 87/57 | HR 90 | Temp 98.1°F | Ht 64.0 in | Wt 124.4 lb

## 2020-07-31 DIAGNOSIS — B9681 Helicobacter pylori [H. pylori] as the cause of diseases classified elsewhere: Secondary | ICD-10-CM

## 2020-07-31 DIAGNOSIS — R591 Generalized enlarged lymph nodes: Secondary | ICD-10-CM

## 2020-07-31 DIAGNOSIS — K2970 Gastritis, unspecified, without bleeding: Secondary | ICD-10-CM

## 2020-07-31 DIAGNOSIS — R3589 Other polyuria: Secondary | ICD-10-CM | POA: Diagnosis not present

## 2020-07-31 MED ORDER — AMOXICILLIN-POT CLAVULANATE 875-125 MG PO TABS
1.0000 | ORAL_TABLET | Freq: Two times a day (BID) | ORAL | 0 refills | Status: DC
  Filled 2020-07-31: qty 14, 7d supply, fill #0

## 2020-07-31 MED ORDER — CLARITHROMYCIN 500 MG PO TABS
500.0000 mg | ORAL_TABLET | Freq: Two times a day (BID) | ORAL | 0 refills | Status: DC
  Filled 2020-07-31: qty 28, 14d supply, fill #0

## 2020-07-31 MED ORDER — CLARITHROMYCIN 500 MG PO TABS
500.0000 mg | ORAL_TABLET | Freq: Two times a day (BID) | ORAL | 0 refills | Status: DC
  Filled 2020-07-31: qty 14, 7d supply, fill #0

## 2020-07-31 MED ORDER — LANSOPRAZOLE 30 MG PO CPDR
30.0000 mg | DELAYED_RELEASE_CAPSULE | Freq: Every day | ORAL | 1 refills | Status: DC
  Filled 2020-07-31: qty 30, 30d supply, fill #0
  Filled 2020-08-11 – 2020-08-12 (×2): qty 30, 30d supply, fill #1

## 2020-07-31 MED ORDER — AMOXICILLIN-POT CLAVULANATE 875-125 MG PO TABS
1.0000 | ORAL_TABLET | Freq: Two times a day (BID) | ORAL | 0 refills | Status: DC
  Filled 2020-07-31: qty 28, 14d supply, fill #0

## 2020-07-31 NOTE — Progress Notes (Signed)
Established Patient Office Visit  Subjective:  Patient ID: Marissa Salazar, female    DOB: 1980-08-28  Age: 40 y.o. MRN: [id number]  CC:  Chief Complaint  Patient presents with   Hospitalization Follow-up    HPI Marissa Salazar presents to follow-up after urgent care visit.  She was seen for sore throat and swollen lymph nodes.  She states she is having burning in esophageal area.  She states it only feels better when her stomach is full.  She is experiencing nausea, she is losing weight.  She is treating.  Half marathon and has been unable to run.  She has developed diarrhea.  She is unable to sleep due to severe acid reflux symptoms.  When she was seen in the urgent care, she was treated with Augmentin due to sore throat.  She has had history of H. pylori in the past.  Symptoms she experienced then were very similar to symptoms she is having now.  She denies fever, headache, chills, or body aches.  She denies chest pain chest pressure or shortness of breath.  She does have nausea without vomiting.  She is experiencing intermittent episodes of diarrhea.   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   BUNIONECTOMY Right 2013   COLPOSCOPY  2009   ESOPHAGOGASTRODUODENOSCOPY N/A 07/17/2015   Procedure: ESOPHAGOGASTRODUODENOSCOPY (EGD);  Surgeon: Manya Silvas, MD;  Location: [facility] ENDOSCOPY;  Service: Endoscopy;  Laterality: N/A;    Family History  Problem Relation Age of Onset   Breast cancer Maternal Aunt 60       BRCA negative   Colon cancer Maternal Grandmother 50   Breast cancer Maternal Grandmother 54   Hypertension Maternal Grandmother    Breast cancer Other        GP   Lung cancer Other        GP   Diabetes Paternal Uncle    Heart attack Maternal Uncle    Pancreatic cancer Maternal Aunt 44    Social History   Socioeconomic History   Marital status: Divorced    Spouse name: Not on file   Number of children: 1   Years  of education: Not on file   Highest education level: Not on file  Occupational History   Occupation: Ship broker  Tobacco Use   Smoking status: Never   Smokeless tobacco: Never  Vaping Use   Vaping Use: Never used  Substance and Sexual Activity   Alcohol use: Yes    Alcohol/week: 0.0 standard drinks    Comment: Rare   Drug use: No   Sexual activity: Not Currently    Birth control/protection: None  Other Topics Concern   Not on file  Social History Narrative   Married      [name]; 1 y/o son      Ship broker; [employer] program      Regular exercise         Social Determinants of Health   Financial Resource Strain: Not on file  Food Insecurity: Not on file  Transportation Needs: Not on file  Physical Activity: Not on file  Stress: Not on file  Social Connections: Not on file  Intimate Partner Violence: Not on file    Outpatient Medications Prior to Visit  Medication Sig Dispense Refill   cholecalciferol (VITAMIN D3) 25 MCG (1000 UNIT) tablet Take 1,000 Units by mouth daily.     cyclobenzaprine (FLEXERIL) 10 MG tablet Take 1 tablet (10 mg  total) by mouth 3 (three) times daily as needed for muscle spasms. 30 tablet 0   Multiple Vitamin (MULTIVITAMIN) tablet Take 1 tablet by mouth daily.     naproxen (NAPROSYN) 500 MG tablet Take 1 tablet (500 mg total) by mouth 2 (two) times daily with a meal. 60 tablet 0   ketoconazole (NIZORAL) 2 % shampoo Apply 1 application topically as directed. Wash scalp 3-4 days a week, let sit 5 minutes and rinse out 120 mL 11   mometasone (ELOCON) 0.1 % cream Apply to affected area as directed daily to twice a day to face until irritation resolved, may use up to 2 weeks then d/c 45 g 0   amoxicillin-clavulanate (AUGMENTIN) 875-125 MG tablet Take 1 tablet by mouth every 12 (twelve) hours. 14 tablet 0   No facility-administered medications prior to visit.    Allergies  Allergen Reactions   Ivp Dye [Iodinated Diagnostic Agents] Hives    ROS Review of  Systems  Constitutional:  Positive for activity change, appetite change and fatigue. Negative for chills and fever.  HENT:  Positive for sore throat. Negative for congestion, ear pain, postnasal drip, rhinorrhea, sinus pressure and sinus pain.   Eyes: Negative.   Respiratory:  Negative for cough, chest tightness, shortness of breath and wheezing.   Cardiovascular:  Negative for chest pain and palpitations.  Gastrointestinal:  Positive for diarrhea and nausea. Negative for constipation and vomiting.       Esophageal pain.  Endocrine: Negative for cold intolerance, heat intolerance, polydipsia and polyuria.  Genitourinary: Negative.   Musculoskeletal:  Negative for arthralgias, back pain and myalgias.  Skin:  Negative for rash.  Allergic/Immunologic: Negative.   Neurological:  Negative for dizziness, weakness and headaches.  Hematological:  Positive for adenopathy.  Psychiatric/Behavioral:  Negative for dysphoric mood. The patient is not nervous/anxious.      Objective:    Physical Exam Vitals and nursing note reviewed.  Constitutional:      Appearance: Normal appearance. She is well-developed. She is ill-appearing.  HENT:     Head: Normocephalic and atraumatic.     Right Ear: Ear canal and external ear normal.     Left Ear: Ear canal and external ear normal.     Nose: Nose normal.     Mouth/Throat:     Mouth: Mucous membranes are moist.     Pharynx: Pharyngeal swelling and posterior oropharyngeal erythema present.  Eyes:     Extraocular Movements: Extraocular movements intact.     Conjunctiva/sclera: Conjunctivae normal.     Pupils: Pupils are equal, round, and reactive to light.  Cardiovascular:     Rate and Rhythm: Normal rate and regular rhythm.     Pulses: Normal pulses.     Heart sounds: Normal heart sounds.  Pulmonary:     Effort: Pulmonary effort is normal.     Breath sounds: Normal breath sounds.  Abdominal:     General: Bowel sounds are normal.     Palpations:  Abdomen is soft.     Tenderness: There is abdominal tenderness.     Comments: Mild generalized tenderness present.  Most significant in epigastric area.  Musculoskeletal:        General: Normal range of motion.     Cervical back: Normal range of motion and neck supple.  Lymphadenopathy:     Cervical: No cervical adenopathy.  Skin:    General: Skin is warm and dry.     Capillary Refill: Capillary refill takes less than 2 seconds.  Neurological:     General: No focal deficit present.     Mental Status: She is alert and oriented to person, place, and time.  Psychiatric:        Mood and Affect: Mood normal.        Behavior: Behavior normal.        Thought Content: Thought content normal.        Judgment: Judgment normal.    Today's Vitals   07/31/20 1023  BP: (!) 87/57  Pulse: 90  Temp: 98.1 F (36.7 C)  SpO2: 100%  Weight: 124 lb 6.4 oz (56.4 kg)  Height: _0  (1.626 m)   Body mass index is 21.35 kg/m.   Wt Readings from Last 3 Encounters:  07/31/20 124 lb 6.4 oz (56.4 kg)  07/21/20 128 lb (58.1 kg)  05/07/20 127 lb 8 oz (57.8 kg)     Health Maintenance Due  Topic Date Due   Pneumococcal Vaccine 11-78 Years old (1 - PCV) Never done   Hepatitis C Screening  Never done   COVID-19 Vaccine (3 - Pfizer risk series) 06/29/2019    There are no preventive care reminders to display for this patient.  Lab Results  Component Value Date   TSH 1.510 05/07/2020   Lab Results  Component Value Date   WBC 5.2 07/31/2020   HGB 14.0 07/31/2020   HCT 41.9 07/31/2020   MCV 90 07/31/2020   PLT 250 07/31/2020   Lab Results  Component Value Date   NA 138 07/31/2020   K 4.5 07/31/2020   CO2 25 07/31/2020   GLUCOSE 71 07/31/2020   BUN 16 07/31/2020   CREATININE 0.79 07/31/2020   BILITOT 0.4 05/07/2020   ALKPHOS 54 05/07/2020   AST 31 05/07/2020   ALT 24 05/07/2020   PROT 7.5 05/07/2020   ALBUMIN 4.6 05/07/2020   CALCIUM 9.5 07/31/2020   ANIONGAP 5 11/25/2019   EGFR 99  07/31/2020   GFR 81.83 12/25/2017   Lab Results  Component Value Date   CHOL 213 (H) 05/07/2020   Lab Results  Component Value Date   HDL 132 05/07/2020   Lab Results  Component Value Date   LDLCALC 72 05/07/2020   Lab Results  Component Value Date   TRIG 46 05/07/2020   Lab Results  Component Value Date   CHOLHDL 1.6 05/07/2020   Lab Results  Component Value Date   HGBA1C 5.0 07/31/2020      Assessment & Plan:  1. Gastritis due to Helicobacter species Will treat with lansoprazole 30 mg capsules daily.  We will also add Biaxin 500 mg tablets and Augmentin 875/125 mg tablets, both twice a day for next 14 days.  Refer to GI for further evaluation and treatment. - lansoprazole (PREVACID) 30 MG capsule; Take 1 capsule (30 mg total) by mouth daily at 12 noon.  Dispense: 30 capsule; Refill: 1 - Ambulatory referral to Gastroenterology - clarithromycin (BIAXIN) 500 MG tablet; Take 1 tablet (500 mg total) by mouth 2 (two) times daily.  Dispense: 28 tablet; Refill: 0 - amoxicillin-clavulanate (AUGMENTIN) 875-125 MG tablet; Take 1 tablet by mouth 2 (two) times daily.  Dispense: 28 tablet; Refill: 0  2. Lymphadenopathy Will check blood count and BMP for further evaluation. - CBC with Differential/Platelet - Basic Metabolic Panel (BMET)  3. Polyuria Will check hemoglobin A1c with labs. - HgB A1c   Problem List Items Addressed This Visit       Digestive   Gastritis due to Helicobacter species -  Primary   Relevant Medications   lansoprazole (PREVACID) 30 MG capsule   clarithromycin (BIAXIN) 500 MG tablet   amoxicillin-clavulanate (AUGMENTIN) 875-125 MG tablet   Other Relevant Orders   Ambulatory referral to Gastroenterology     Immune and Lymphatic   Lymphadenopathy   Relevant Orders   CBC with Differential/Platelet   Basic Metabolic Panel (BMET) (Completed)     Other   Polyuria   Relevant Orders   HgB A1c (Completed)    Meds ordered this encounter  Medications    DISCONTD: clarithromycin (BIAXIN) 500 MG tablet    Sig: Take 1 tablet (500 mg total) by mouth 2 (two) times daily.    Dispense:  14 tablet    Refill:  0    Order Specific Question:   Supervising Provider    Answer:   Hali Marry [[id number]]   DISCONTD: amoxicillin-clavulanate (AUGMENTIN) 875-125 MG tablet    Sig: Take 1 tablet by mouth 2 (two) times daily.    Dispense:  14 tablet    Refill:  0    Order Specific Question:   Supervising Provider    Answer:   Beatrice Lecher D [[id number]]   lansoprazole (PREVACID) 30 MG capsule    Sig: Take 1 capsule (30 mg total) by mouth daily at 12 noon.    Dispense:  30 capsule    Refill:  1    Order Specific Question:   Supervising Provider    Answer:   Beatrice Lecher D [[id number]]   clarithromycin (BIAXIN) 500 MG tablet    Sig: Take 1 tablet (500 mg total) by mouth 2 (two) times daily.    Dispense:  28 tablet    Refill:  0    Prescription should be for 14 days. Please disregard initial prescription for 7 days.    Order Specific Question:   Supervising Provider    Answer:   Beatrice Lecher D [[id number]]   amoxicillin-clavulanate (AUGMENTIN) 875-125 MG tablet    Sig: Take 1 tablet by mouth 2 (two) times daily.    Dispense:  28 tablet    Refill:  0    Prescription should be for 14 days. Please disregard initial prescription for 7 days.    Order Specific Question:   Supervising Provider    Answer:   Beatrice Lecher D [[id number]]     Follow-up: Return in about 3 weeks (around 08/21/2020) for gastritis/h. pylori infection. Marland Kitchen    Ronnell Freshwater, NP

## 2020-07-31 NOTE — Telephone Encounter (Signed)
Called patient to notify it was a mistake on our end. RX has been sent in for two weeks.

## 2020-07-31 NOTE — Telephone Encounter (Signed)
Patient said the antibiotic was sent in for one week and she thought it was for two weeks to treat the infection. Please advise, thanks.

## 2020-08-01 LAB — HEMOGLOBIN A1C
Est. average glucose Bld gHb Est-mCnc: 97 mg/dL
Hgb A1c MFr Bld: 5 % (ref 4.8–5.6)

## 2020-08-01 LAB — CBC WITH DIFFERENTIAL/PLATELET
Basophils Absolute: 0 10*3/uL (ref 0.0–0.2)
Basos: 1 %
EOS (ABSOLUTE): 0.1 10*3/uL (ref 0.0–0.4)
Eos: 1 %
Hematocrit: 41.9 % (ref 34.0–46.6)
Hemoglobin: 14 g/dL (ref 11.1–15.9)
Immature Grans (Abs): 0 10*3/uL (ref 0.0–0.1)
Immature Granulocytes: 0 %
Lymphocytes Absolute: 1.4 10*3/uL (ref 0.7–3.1)
Lymphs: 27 %
MCH: 30 pg (ref 26.6–33.0)
MCHC: 33.4 g/dL (ref 31.5–35.7)
MCV: 90 fL (ref 79–97)
Monocytes Absolute: 0.3 10*3/uL (ref 0.1–0.9)
Monocytes: 6 %
Neutrophils Absolute: 3.4 10*3/uL (ref 1.4–7.0)
Neutrophils: 65 %
Platelets: 250 10*3/uL (ref 150–450)
RBC: 4.66 x10E6/uL (ref 3.77–5.28)
RDW: 13.1 % (ref 11.7–15.4)
WBC: 5.2 10*3/uL (ref 3.4–10.8)

## 2020-08-01 LAB — BASIC METABOLIC PANEL
BUN/Creatinine Ratio: 20 (ref 9–23)
BUN: 16 mg/dL (ref 6–20)
CO2: 25 mmol/L (ref 20–29)
Calcium: 9.5 mg/dL (ref 8.7–10.2)
Chloride: 100 mmol/L (ref 96–106)
Creatinine, Ser: 0.79 mg/dL (ref 0.57–1.00)
Glucose: 71 mg/dL (ref 65–99)
Potassium: 4.5 mmol/L (ref 3.5–5.2)
Sodium: 138 mmol/L (ref 134–144)
eGFR: 99 mL/min/{1.73_m2} (ref >59)

## 2020-08-03 ENCOUNTER — Encounter: Payer: Self-pay | Admitting: Gastroenterology

## 2020-08-03 ENCOUNTER — Other Ambulatory Visit: Payer: Self-pay

## 2020-08-03 ENCOUNTER — Encounter: Payer: No Typology Code available for payment source | Admitting: Physical Therapy

## 2020-08-03 MED ORDER — ZOLPIDEM TARTRATE 10 MG PO TABS
ORAL_TABLET | ORAL | 3 refills | Status: DC
  Filled 2020-08-03: qty 30, 30d supply, fill #0
  Filled 2020-10-27: qty 30, 30d supply, fill #1
  Filled 2020-11-30: qty 30, 30d supply, fill #2
  Filled 2021-01-05: qty 30, 30d supply, fill #3

## 2020-08-03 NOTE — Progress Notes (Signed)
Labs look good. Review with patient at her visit 7/11

## 2020-08-04 ENCOUNTER — Other Ambulatory Visit: Payer: Self-pay

## 2020-08-04 ENCOUNTER — Encounter: Payer: Self-pay | Admitting: Gastroenterology

## 2020-08-04 MED ORDER — ZOLPIDEM TARTRATE 10 MG PO TABS
ORAL_TABLET | ORAL | 2 refills | Status: DC
  Filled 2020-08-04: qty 30, 30d supply, fill #0

## 2020-08-06 ENCOUNTER — Ambulatory Visit: Payer: No Typology Code available for payment source | Admitting: Dermatology

## 2020-08-10 DIAGNOSIS — R591 Generalized enlarged lymph nodes: Secondary | ICD-10-CM | POA: Insufficient documentation

## 2020-08-10 DIAGNOSIS — R3589 Other polyuria: Secondary | ICD-10-CM | POA: Insufficient documentation

## 2020-08-10 DIAGNOSIS — B9681 Helicobacter pylori [H. pylori] as the cause of diseases classified elsewhere: Secondary | ICD-10-CM | POA: Insufficient documentation

## 2020-08-10 DIAGNOSIS — K2970 Gastritis, unspecified, without bleeding: Secondary | ICD-10-CM | POA: Insufficient documentation

## 2020-08-11 ENCOUNTER — Other Ambulatory Visit: Payer: Self-pay

## 2020-08-12 ENCOUNTER — Encounter: Payer: Self-pay | Admitting: Hematology and Oncology

## 2020-08-12 ENCOUNTER — Other Ambulatory Visit: Payer: Self-pay | Admitting: Nurse Practitioner

## 2020-08-12 ENCOUNTER — Other Ambulatory Visit: Payer: Self-pay

## 2020-08-12 DIAGNOSIS — B9681 Helicobacter pylori [H. pylori] as the cause of diseases classified elsewhere: Secondary | ICD-10-CM

## 2020-08-12 DIAGNOSIS — K2970 Gastritis, unspecified, without bleeding: Secondary | ICD-10-CM

## 2020-08-12 NOTE — Telephone Encounter (Signed)
The most I will change it to is BID, but ii am willing to do twice daily.

## 2020-08-14 ENCOUNTER — Other Ambulatory Visit: Payer: Self-pay

## 2020-08-14 ENCOUNTER — Other Ambulatory Visit: Payer: Self-pay | Admitting: Nurse Practitioner

## 2020-08-14 DIAGNOSIS — K2970 Gastritis, unspecified, without bleeding: Secondary | ICD-10-CM

## 2020-08-14 DIAGNOSIS — B9681 Helicobacter pylori [H. pylori] as the cause of diseases classified elsewhere: Secondary | ICD-10-CM

## 2020-08-14 MED FILL — Lansoprazole Cap Delayed Release 30 MG: ORAL | 30 days supply | Qty: 30 | Fill #0 | Status: CN

## 2020-08-17 ENCOUNTER — Ambulatory Visit: Payer: No Typology Code available for payment source | Admitting: Nurse Practitioner

## 2020-08-17 ENCOUNTER — Telehealth: Payer: Self-pay | Admitting: Physician Assistant

## 2020-08-17 DIAGNOSIS — K2970 Gastritis, unspecified, without bleeding: Secondary | ICD-10-CM

## 2020-08-17 DIAGNOSIS — B9681 Helicobacter pylori [H. pylori] as the cause of diseases classified elsewhere: Secondary | ICD-10-CM

## 2020-08-17 NOTE — Telephone Encounter (Signed)
Patient would like to see if we can get her GI appointment expedited that is on July 27 if possible and a different office is okay with patient. Thanks

## 2020-08-17 NOTE — Addendum Note (Signed)
Addended by: Mickel Crow on: 08/17/2020 11:39 AM   Modules accepted: Orders

## 2020-08-17 NOTE — Telephone Encounter (Signed)
Referral has been placed. AS, CMA 

## 2020-08-18 ENCOUNTER — Other Ambulatory Visit: Payer: Self-pay | Admitting: Nurse Practitioner

## 2020-08-18 ENCOUNTER — Telehealth: Payer: Self-pay | Admitting: Physician Assistant

## 2020-08-18 ENCOUNTER — Other Ambulatory Visit: Payer: Self-pay

## 2020-08-18 ENCOUNTER — Other Ambulatory Visit: Payer: Self-pay | Admitting: Radiology

## 2020-08-18 DIAGNOSIS — K2100 Gastro-esophageal reflux disease with esophagitis, without bleeding: Secondary | ICD-10-CM

## 2020-08-18 MED ORDER — PANTOPRAZOLE SODIUM 40 MG PO TBEC
40.0000 mg | DELAYED_RELEASE_TABLET | Freq: Every day | ORAL | 3 refills | Status: DC
  Filled 2020-08-18: qty 30, 30d supply, fill #0

## 2020-08-18 NOTE — Progress Notes (Signed)
Discontinued current prescription for lansoprazole and changed to pantoprazole 40mg  daily. She has upcoming appointment with GI later this month.

## 2020-08-18 NOTE — Telephone Encounter (Signed)
Patient would like to try Protonix for acid reflux as her current medication isn't working very well. Patient uses [facility], thanks.

## 2020-08-18 NOTE — Telephone Encounter (Signed)
Discontinued current prescription for lansoprazole and changed to pantoprazole 40mg  daily. She has upcoming appointment with GI later this month.

## 2020-08-20 ENCOUNTER — Other Ambulatory Visit: Payer: Self-pay

## 2020-08-20 ENCOUNTER — Telehealth: Payer: Self-pay | Admitting: Physician Assistant

## 2020-08-20 ENCOUNTER — Encounter: Payer: Self-pay | Admitting: Nurse Practitioner

## 2020-08-20 ENCOUNTER — Ambulatory Visit (INDEPENDENT_AMBULATORY_CARE_PROVIDER_SITE_OTHER): Payer: No Typology Code available for payment source | Admitting: Nurse Practitioner

## 2020-08-20 ENCOUNTER — Encounter: Payer: Self-pay | Admitting: Hematology and Oncology

## 2020-08-20 ENCOUNTER — Other Ambulatory Visit: Payer: Self-pay | Admitting: Radiology

## 2020-08-20 VITALS — BP 89/57 | HR 73 | Temp 98.0°F | Ht 64.0 in | Wt 126.8 lb

## 2020-08-20 DIAGNOSIS — B9681 Helicobacter pylori [H. pylori] as the cause of diseases classified elsewhere: Secondary | ICD-10-CM

## 2020-08-20 DIAGNOSIS — K2970 Gastritis, unspecified, without bleeding: Secondary | ICD-10-CM | POA: Diagnosis not present

## 2020-08-20 DIAGNOSIS — Z1231 Encounter for screening mammogram for malignant neoplasm of breast: Secondary | ICD-10-CM

## 2020-08-20 DIAGNOSIS — K2100 Gastro-esophageal reflux disease with esophagitis, without bleeding: Secondary | ICD-10-CM | POA: Diagnosis not present

## 2020-08-20 MED ORDER — SUCRALFATE 1 G PO TABS
1.0000 g | ORAL_TABLET | Freq: Three times a day (TID) | ORAL | 2 refills | Status: DC
  Filled 2020-08-20: qty 90, 23d supply, fill #0

## 2020-08-20 MED ORDER — METRONIDAZOLE 500 MG PO TABS
500.0000 mg | ORAL_TABLET | Freq: Two times a day (BID) | ORAL | 0 refills | Status: DC
Start: 2020-08-20 — End: 2020-08-26
  Filled 2020-08-20: qty 14, 7d supply, fill #0

## 2020-08-20 MED ORDER — SUCRALFATE 1 GM/10ML PO SUSP
1.0000 g | Freq: Three times a day (TID) | ORAL | 2 refills | Status: DC
  Filled 2020-08-20 (×2): qty 420, 11d supply, fill #0

## 2020-08-20 NOTE — Progress Notes (Signed)
arafate  Established Patient Office Visit  Subjective:  Patient ID: Marissa Salazar, female    DOB: 1980-07-25  Age: 40 y.o. MRN: [id number]  CC:  Chief Complaint  Patient presents with   Follow-up    HPI Charlsie A Lefeber presents for evaluation of gastritis and moderate to severe GERD.  Most recent visit, she was treated with a round of amoxicillin, Biaxin, and lansoprazole.  Finished antibiotics last week.  Still having gastric pain and left abdominal pain, both upper and lower quadrant.  Did change lansoprazole to pantoprazole daily.  She states she is doing a little bit better with this.  Sleep at night now.  States she still getting very bloated and throat is burning most of the day.  She has changed her diet to basically bland food only.  She has an appointment coming up with GI on 09/02/2020.  She denies nausea or vomiting, diarrhea or constipation.  Pain is mostly epigastric and left portion of the abdomen.  She is very fatigued.  Symptoms are interfering with her normal activities.  Unable to participate like she would normally due to abdominal discomfort. Blood work checked at last visit.  BMP, CBC, and hemoglobin A1c were all within normal limits.  Past Medical History:  Diagnosis Date   Abnormal Pap smear    in past; normal colpo   Allergic rhinitis    Allergy    Anemia    Hx fibroids   Blood transfusion without reported diagnosis 05/2019   Dysplastic nevus 10/05/2016   R infra axillary, mild atypia   Family history of breast cancer    Family history of colon cancer    Family history of pancreatic cancer    Fibroid    Iron deficiency anemia due to chronic blood loss    Superficial vein thrombosis 2019   Left leg   Uterine fibroid 05/09/2019   sonohysterogram showing 7.8 x 7.4 cm fibroid with 4.6 cm in endometrial cavity - Dr. [name]    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   BUNIONECTOMY Right 2013   COLPOSCOPY   2009   ESOPHAGOGASTRODUODENOSCOPY N/A 07/17/2015   Procedure: ESOPHAGOGASTRODUODENOSCOPY (EGD);  Surgeon: Manya Silvas, MD;  Location: [facility] ENDOSCOPY;  Service: Endoscopy;  Laterality: N/A;    Family History  Problem Relation Age of Onset   Breast cancer Maternal Aunt 60       BRCA negative   Colon cancer Maternal Grandmother 50   Breast cancer Maternal Grandmother 54   Hypertension Maternal Grandmother    Breast cancer Other        GP   Lung cancer Other        GP   Diabetes Paternal Uncle    Heart attack Maternal Uncle    Pancreatic cancer Maternal Aunt 44    Social History   Socioeconomic History   Marital status: Divorced    Spouse name: Not on file   Number of children: 1   Years of education: Not on file   Highest education level: Not on file  Occupational History   Occupation: Ship broker  Tobacco Use   Smoking status: Never   Smokeless tobacco: Never  Vaping Use   Vaping Use: Never used  Substance and Sexual Activity   Alcohol use: Yes    Alcohol/week: 0.0 standard drinks    Comment: Rare   Drug use: No   Sexual activity: Not Currently    Birth control/protection: None  Other Topics Concern   Not on file  Social History Narrative   Married      [name]; 65 y/o son      Ship broker; [employer] program      Regular exercise         Social Determinants of Health   Financial Resource Strain: Not on file  Food Insecurity: Not on file  Transportation Needs: Not on file  Physical Activity: Not on file  Stress: Not on file  Social Connections: Not on file  Intimate Partner Violence: Not on file    Outpatient Medications Prior to Visit  Medication Sig Dispense Refill   cholecalciferol (VITAMIN D3) 25 MCG (1000 UNIT) tablet Take 1,000 Units by mouth daily.     cyclobenzaprine (FLEXERIL) 10 MG tablet Take 1 tablet (10 mg total) by mouth 3 (three) times daily as needed for muscle spasms. 30 tablet 0   Multiple Vitamin (MULTIVITAMIN) tablet Take 1 tablet by mouth  daily.     pantoprazole (PROTONIX) 40 MG tablet Take 1 tablet (40 mg total) by mouth daily. 30 tablet 3   zolpidem (AMBIEN) 10 MG tablet TAKE 1 TABLET BY MOUTH AT BEDTIME AS NEEDED FOR INSOMNIA 30 tablet 3   amoxicillin-clavulanate (AUGMENTIN) 875-125 MG tablet Take 1 tablet by mouth 2 (two) times daily. 28 tablet 0   clarithromycin (BIAXIN) 500 MG tablet Take 1 tablet (500 mg total) by mouth 2 (two) times daily. 28 tablet 0   naproxen (NAPROSYN) 500 MG tablet Take 1 tablet (500 mg total) by mouth 2 (two) times daily with a meal. 60 tablet 0   No facility-administered medications prior to visit.    Allergies  Allergen Reactions   Ivp Dye [Iodinated Diagnostic Agents] Hives    ROS Review of Systems  Constitutional:  Positive for activity change, appetite change and fatigue. Negative for chills and fever.  HENT:  Positive for sore throat. Negative for congestion, postnasal drip, rhinorrhea and sinus pressure.   Eyes: Negative.   Respiratory:  Negative for cough, chest tightness and shortness of breath.   Cardiovascular:  Negative for chest pain and palpitations.  Gastrointestinal:  Positive for abdominal distention. Negative for constipation, diarrhea, nausea and vomiting.       Epigastric pain and tenderness in the left portion of the abdomen, both upper and lower quadrants.  Endocrine: Negative for cold intolerance, heat intolerance, polydipsia and polyuria.  Musculoskeletal:  Negative for back pain and myalgias.  Skin:  Negative for rash.  Allergic/Immunologic: Negative.   Neurological:  Negative for dizziness, weakness and headaches.  Hematological:  Positive for adenopathy.  Psychiatric/Behavioral:  Positive for sleep disturbance. The patient is nervous/anxious.      Objective:    Physical Exam Vitals and nursing note reviewed.  Constitutional:      Appearance: Normal appearance. She is well-developed. She is ill-appearing.  HENT:     Head: Normocephalic and atraumatic.      Nose: Nose normal.     Mouth/Throat:     Mouth: Mucous membranes are moist.     Pharynx: Posterior oropharyngeal erythema present.  Eyes:     Extraocular Movements: Extraocular movements intact.     Conjunctiva/sclera: Conjunctivae normal.     Pupils: Pupils are equal, round, and reactive to light.  Cardiovascular:     Rate and Rhythm: Normal rate and regular rhythm.     Pulses: Normal pulses.     Heart sounds: Normal heart sounds.  Pulmonary:     Effort: Pulmonary effort is normal.  Breath sounds: Normal breath sounds.  Abdominal:     General: Abdomen is flat. Bowel sounds are increased.     Palpations: Abdomen is soft.     Tenderness: There is abdominal tenderness in the epigastric area, left upper quadrant and left lower quadrant.  Musculoskeletal:        General: Normal range of motion.     Cervical back: Normal range of motion and neck supple.  Lymphadenopathy:     Cervical: Cervical adenopathy present.  Skin:    General: Skin is warm and dry.     Capillary Refill: Capillary refill takes less than 2 seconds.  Neurological:     General: No focal deficit present.     Mental Status: She is alert and oriented to person, place, and time.  Psychiatric:        Mood and Affect: Mood normal.        Behavior: Behavior normal.        Thought Content: Thought content normal.        Judgment: Judgment normal.    Today's Vitals   08/20/20 0816  BP: (!) 89/57  Pulse: 73  Temp: 98 F (36.7 C)  SpO2: 100%  Weight: 126 lb 12.8 oz (57.5 kg)   Body mass index is 21.77 kg/m.   Wt Readings from Last 3 Encounters:  08/20/20 126 lb 12.8 oz (57.5 kg)  07/31/20 124 lb 6.4 oz (56.4 kg)  07/21/20 128 lb (58.1 kg)     Health Maintenance Due  Topic Date Due   Pneumococcal Vaccine 83-17 Years old (1 - PCV) Never done   Hepatitis C Screening  Never done   COVID-19 Vaccine (3 - Pfizer risk series) 06/29/2019    There are no preventive care reminders to display for this  patient.  Lab Results  Component Value Date   TSH 1.510 05/07/2020   Lab Results  Component Value Date   WBC 5.2 07/31/2020   HGB 14.0 07/31/2020   HCT 41.9 07/31/2020   MCV 90 07/31/2020   PLT 250 07/31/2020   Lab Results  Component Value Date   NA 138 07/31/2020   K 4.5 07/31/2020   CO2 25 07/31/2020   GLUCOSE 71 07/31/2020   BUN 16 07/31/2020   CREATININE 0.79 07/31/2020   BILITOT 0.4 05/07/2020   ALKPHOS 54 05/07/2020   AST 31 05/07/2020   ALT 24 05/07/2020   PROT 7.5 05/07/2020   ALBUMIN 4.6 05/07/2020   CALCIUM 9.5 07/31/2020   ANIONGAP 5 11/25/2019   EGFR 99 07/31/2020   GFR 81.83 12/25/2017   Lab Results  Component Value Date   CHOL 213 (H) 05/07/2020   Lab Results  Component Value Date   HDL 132 05/07/2020   Lab Results  Component Value Date   LDLCALC 72 05/07/2020   Lab Results  Component Value Date   TRIG 46 05/07/2020   Lab Results  Component Value Date   CHOLHDL 1.6 05/07/2020   Lab Results  Component Value Date   HGBA1C 5.0 07/31/2020      Assessment & Plan:  1. Gastroesophageal reflux disease with esophagitis without hemorrhage Patient not feeling much better after initial treatment with amoxicillin, Biaxin, and lansoprazole.  Will do trial of metronidazole 500 mg tablets twice daily.  And super fate 1 g 3 times a day.  Check for H. pylori and gluten sensitivity screen.  Continue with bland diet.  Consult with GI as scheduled.   - H Pylori, IGM, IGG, IGA AB -  Gluten Sensitivity Screen - metroNIDAZOLE (FLAGYL) 500 MG tablet; Take 1 tablet (500 mg total) by mouth 2 (two) times daily.  Dispense: 14 tablet; Refill: 0 - sucralfate (CARAFATE) 1 GM/10ML suspension; Take 10 mLs (1 g total) by mouth 4 (four) times daily -  with meals and at bedtime.  Dispense: 500 mL; Refill: 2  2. Gastritis due to Helicobacter species Patient not feeling much better after initial treatment with amoxicillin, Biaxin, and lansoprazole.  Will do trial of  metronidazole 500 mg tablets twice daily.  And super fate 1 g 3 times a day.  Check for H. pylori and gluten sensitivity screen.  Continue with bland diet.  Consult with GI as scheduled. - H Pylori, IGM, IGG, IGA AB - Gluten Sensitivity Screen - metroNIDAZOLE (FLAGYL) 500 MG tablet; Take 1 tablet (500 mg total) by mouth 2 (two) times daily.  Dispense: 14 tablet; Refill: 0 - sucralfate (CARAFATE) 1 GM/10ML suspension; Take 10 mLs (1 g total) by mouth 4 (four) times daily -  with meals and at bedtime.  Dispense: 500 mL; Refill: 2   Problem List Items Addressed This Visit       Digestive   GERD (gastroesophageal reflux disease) - Primary   Relevant Medications   metroNIDAZOLE (FLAGYL) 500 MG tablet   sucralfate (CARAFATE) 1 GM/10ML suspension   Other Relevant Orders   H Pylori, IGM, IGG, IGA AB   Gluten Sensitivity Screen   Gastritis due to Helicobacter species   Relevant Medications   metroNIDAZOLE (FLAGYL) 500 MG tablet   sucralfate (CARAFATE) 1 GM/10ML suspension   Other Relevant Orders   H Pylori, IGM, IGG, IGA AB   Gluten Sensitivity Screen    Meds ordered this encounter  Medications   metroNIDAZOLE (FLAGYL) 500 MG tablet    Sig: Take 1 tablet (500 mg total) by mouth 2 (two) times daily.    Dispense:  14 tablet    Refill:  0    Order Specific Question:   Supervising Provider    Answer:   Beatrice Lecher D [[id number]]   DISCONTD: sucralfate (CARAFATE) 1 g tablet    Sig: Take 1 tablet (1 g total) by mouth 4 (four) times daily -  with meals and at bedtime.    Dispense:  90 tablet    Refill:  2    Order Specific Question:   Supervising Provider    Answer:   Beatrice Lecher D [[id number]]   sucralfate (CARAFATE) 1 GM/10ML suspension    Sig: Take 10 mLs (1 g total) by mouth 4 (four) times daily -  with meals and at bedtime.    Dispense:  500 mL    Refill:  2    Please change original RX to suspension. Thanks.    Order Specific Question:   Supervising Provider    Answer:    Hali Marry [[id number]]    Follow-up: Return for prn worsening or persistent symptoms - can we see if GI at [facility] can see her sooner than 7/27.    Ronnell Freshwater, NP

## 2020-08-20 NOTE — Telephone Encounter (Signed)
I changed this to suspension and resent to [place].

## 2020-08-20 NOTE — Telephone Encounter (Signed)
Called pt she is aware of the new rx change that was sent to the pharmacy

## 2020-08-20 NOTE — Telephone Encounter (Signed)
Patient would like her Carafate sent in as a liquid as it is easier on her stomach. It was sent in as pill form. Patient uses [facility]. Thanks

## 2020-08-23 NOTE — Progress Notes (Signed)
H. Pylori IgM positive. Waiting on gluten sensitivity results.

## 2020-08-25 NOTE — Progress Notes (Signed)
Gluten sensitivity negative.

## 2020-08-26 ENCOUNTER — Encounter: Payer: Self-pay | Admitting: Nurse Practitioner

## 2020-08-26 ENCOUNTER — Other Ambulatory Visit: Payer: Self-pay | Admitting: Nurse Practitioner

## 2020-08-26 ENCOUNTER — Telehealth: Payer: Self-pay | Admitting: Physician Assistant

## 2020-08-26 ENCOUNTER — Other Ambulatory Visit: Payer: Self-pay

## 2020-08-26 DIAGNOSIS — K2970 Gastritis, unspecified, without bleeding: Secondary | ICD-10-CM

## 2020-08-26 DIAGNOSIS — B9681 Helicobacter pylori [H. pylori] as the cause of diseases classified elsewhere: Secondary | ICD-10-CM

## 2020-08-26 MED ORDER — AMOXICILLIN 875 MG PO TABS
875.0000 mg | ORAL_TABLET | Freq: Two times a day (BID) | ORAL | 0 refills | Status: DC
  Filled 2020-08-26: qty 28, 14d supply, fill #0

## 2020-08-26 MED ORDER — CLARITHROMYCIN 500 MG PO TABS
500.0000 mg | ORAL_TABLET | Freq: Two times a day (BID) | ORAL | 0 refills | Status: DC
  Filled 2020-08-26: qty 28, 14d supply, fill #0

## 2020-08-26 NOTE — Progress Notes (Signed)
Mychart message sent to patient about results. She has referral to GI

## 2020-08-26 NOTE — Telephone Encounter (Signed)
Patient would like a new referral for her gastro as she has would like to get in earlier if possible. Please advise, thanks.

## 2020-08-26 NOTE — Telephone Encounter (Signed)
Referral was replaced on 08/17/20. AS, CMA

## 2020-08-26 NOTE — Progress Notes (Signed)
Patient having worsening epigastric symptoms. Appointment with GI not until 7/27. Not getting much improvement from treatment with flagyl. Will repeat treatment with amoxicillin and biaxin both for 14 days. Continue with pantoprazole and sucralfate.

## 2020-08-27 ENCOUNTER — Other Ambulatory Visit: Payer: Self-pay | Admitting: Nurse Practitioner

## 2020-08-27 ENCOUNTER — Other Ambulatory Visit: Payer: Self-pay

## 2020-08-27 ENCOUNTER — Encounter: Payer: Self-pay | Admitting: Nurse Practitioner

## 2020-08-27 DIAGNOSIS — B9681 Helicobacter pylori [H. pylori] as the cause of diseases classified elsewhere: Secondary | ICD-10-CM

## 2020-08-27 DIAGNOSIS — K2970 Gastritis, unspecified, without bleeding: Secondary | ICD-10-CM

## 2020-08-27 MED ORDER — METRONIDAZOLE 500 MG PO TABS
500.0000 mg | ORAL_TABLET | Freq: Three times a day (TID) | ORAL | 0 refills | Status: DC
  Filled 2020-08-27: qty 21, 7d supply, fill #0

## 2020-08-27 NOTE — Progress Notes (Signed)
D/c amoxicillin and biaxin. Do second round of flagyl. New prescription sent to pharmacy.

## 2020-08-28 LAB — F004-IGE WHEAT: Wheat IgE: <0.1 kU/L

## 2020-08-28 LAB — ANTIGLIADIN IGG (NATIVE): Antigliadin IgG (native): 9 units (ref 0–19)

## 2020-08-28 LAB — H PYLORI, IGM, IGG, IGA AB
H pylori, IgM Abs: <9 units (ref 0.0–8.9)
H. pylori, IgA Abs: <9 units (ref 0.0–8.9)
H. pylori, IgG AbS: 0.28 Index Value (ref 0.00–0.79)

## 2020-08-28 LAB — NOTE:

## 2020-08-28 LAB — GLUTEN SENSITIVITY SCREEN: tTG/DGP Screen: NEGATIVE

## 2020-09-02 ENCOUNTER — Telehealth: Payer: Self-pay | Admitting: Gastroenterology

## 2020-09-02 ENCOUNTER — Other Ambulatory Visit: Payer: Self-pay

## 2020-09-02 ENCOUNTER — Encounter: Payer: Self-pay | Admitting: Gastroenterology

## 2020-09-02 ENCOUNTER — Ambulatory Visit (INDEPENDENT_AMBULATORY_CARE_PROVIDER_SITE_OTHER): Payer: No Typology Code available for payment source | Admitting: Gastroenterology

## 2020-09-02 VITALS — BP 92/62 | HR 59 | Ht 64.0 in | Wt 127.5 lb

## 2020-09-02 DIAGNOSIS — Z800 Family history of malignant neoplasm of digestive organs: Secondary | ICD-10-CM

## 2020-09-02 DIAGNOSIS — K219 Gastro-esophageal reflux disease without esophagitis: Secondary | ICD-10-CM | POA: Diagnosis not present

## 2020-09-02 DIAGNOSIS — R1032 Left lower quadrant pain: Secondary | ICD-10-CM

## 2020-09-02 DIAGNOSIS — Z1509 Genetic susceptibility to other malignant neoplasm: Secondary | ICD-10-CM

## 2020-09-02 DIAGNOSIS — R1012 Left upper quadrant pain: Secondary | ICD-10-CM | POA: Diagnosis not present

## 2020-09-02 DIAGNOSIS — Z1589 Genetic susceptibility to other disease: Secondary | ICD-10-CM

## 2020-09-02 DIAGNOSIS — Z1501 Genetic susceptibility to malignant neoplasm of breast: Secondary | ICD-10-CM

## 2020-09-02 NOTE — Patient Instructions (Signed)
If you are age 38 or older, your body mass index should be between 23-30. Your Body mass index is 21.89 kg/m. If this is out of the aforementioned range listed, please consider follow up with your Primary Care Provider.  If you are age 54 or younger, your body mass index should be between 19-25. Your Body mass index is 21.89 kg/m. If this is out of the aformentioned range listed, please consider follow up with your Primary Care Provider.   You have been given a sample of Clenpiq for your Colonoscopy. Please follow the directions printed, not what is on the box.  Due to recent changes in healthcare laws, you may see the results of your imaging and laboratory studies on MyChart before your provider has had a chance to review them.  We understand that in some cases there may be results that are confusing or concerning to you. Not all laboratory results come back in the same time frame and the provider may be waiting for multiple results in order to interpret others.  Please give [place] 48 hours in order for your provider to thoroughly review all the results before contacting the office for clarification of your results.   Thank you for choosing me and [facility].  Vito Cirigliano, D.O.

## 2020-09-02 NOTE — Telephone Encounter (Signed)
Inbound call from patient. Asking if the PA or pre-cert for procedure scheduled 7/28 is approved. Would like a call back. Best contact number [phone]

## 2020-09-02 NOTE — Telephone Encounter (Signed)
I contacted the patient regarding her procedure and PA. Joey has submitted the procedure for EGD/colon urgently. Insurance stated it still could take 1-2 days for  authorization. The patient verbalized understanding and is willing to proceed.

## 2020-09-02 NOTE — Progress Notes (Signed)
Chief Complaint: GERD, gastritis, history of H. 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 pylori.  Symptoms started approximately 3 months ago with index symptoms of headache, sinus congestion, neck tightness.  She is an avid runner, but has not been able to run the last few months due to the symptoms along with fatigue and reflux.  -07/21/2020: Urgent care evaluation for evaluation of sinus congestion and sore throat.  Normal CBC and BMP.  Was treated with Augmentin.  - 07/31/2020: Follow-up with her PCM from UC. c/o heartburn, diarrhea.  Reflux symptoms worse at night.  Was treated with Biaxin, lansoprazole in addition to continued Augmentin for possible H. pylori.  - 08/20/2020: Follow-up with her PCM. She reported not feeling much better after treatment with amoxicillin, Biaxin, and lansoprazole.  Was then treated with metronidazole 500 mg bid, sucralfate along with repeat H. pylori check and celiac panel. - H. pylori negative (although notes say H pylori IgM positive, that was actually the result from 5 years ago) - TTG, antigliadin negative  Minimal change in sxs with the above interventions.  She continues to have reflux symptoms described as heartburn, regurgitation.  No dysphagia.  Also with burning type pain in the LUQ/LLQ.  Takes Protonix 40 mg QAM.  Has 1 more day of Flagyl.  Previously followed by GI [facility], last seen in 08/2015.  At that time, was diagnosed with Helicobacter pylori gastritis with clinical improvement after completing antimicrobial therapy.  Endoscopic History: - EGD (07/17/2015): H. pylori gastritis  Family history notable for MGM with Colon CA (died at 17) and Maternal Aunt with Pancreatic CA (died at 4). Mother gets no screening.  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   Past Medical History:  Diagnosis Date   Abnormal Pap smear    in past; normal colpo   Allergic rhinitis    Allergy    Anemia    Hx fibroids   Blood transfusion without reported diagnosis 05/2019   Dysplastic nevus 10/05/2016   R infra axillary, mild atypia   Family history of breast cancer    Family history of colon cancer    Family history of pancreatic cancer    Fibroid    Iron deficiency anemia due to chronic blood loss    Superficial vein thrombosis 2019   Left leg   Uterine fibroid 05/09/2019   sonohysterogram showing 7.8 x 7.4 cm fibroid with 4.6 cm in endometrial cavity - Dr. [name]     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   BUNIONECTOMY Right 2013   COLPOSCOPY  2009   ESOPHAGOGASTRODUODENOSCOPY N/A 07/17/2015   Procedure: ESOPHAGOGASTRODUODENOSCOPY (EGD);  Surgeon: Manya Silvas, MD;  Location: [facility] ENDOSCOPY;  Service: Endoscopy;  Laterality: N/A;   Family History  Problem Relation Age of Onset   Colon cancer Maternal Grandmother 35   Breast cancer Maternal Grandmother 54   Hypertension Maternal Grandmother    Breast cancer Maternal Aunt 60       BRCA negative   Pancreatic cancer Maternal Aunt 44   Heart attack Maternal Uncle    Diabetes Paternal Uncle    Breast cancer Other  GP   Lung cancer Other        GP   Esophageal cancer Neg Hx    Liver cancer Neg Hx    Stomach cancer Neg Hx    Social History   Tobacco Use   Smoking status: Never   Smokeless tobacco: Never  Vaping Use   Vaping Use: Never used  Substance Use Topics   Alcohol use: Yes    Alcohol/week: 0.0 standard drinks    Comment: Rare   Drug use: No   Current Outpatient Medications  Medication Sig Dispense Refill   cholecalciferol (VITAMIN D3) 25 MCG (1000 UNIT) tablet Take 1,000 Units by mouth daily.     cyclobenzaprine (FLEXERIL) 10 MG tablet Take 1 tablet (10 mg total) by  mouth 3 (three) times daily as needed for muscle spasms. 30 tablet 0   metroNIDAZOLE (FLAGYL) 500 MG tablet Take 1 tablet (500 mg total) by mouth 3 (three) times daily. 21 tablet 0   Multiple Vitamin (MULTIVITAMIN) tablet Take 1 tablet by mouth daily.     pantoprazole (PROTONIX) 40 MG tablet Take 1 tablet (40 mg total) by mouth daily. 30 tablet 3   sucralfate (CARAFATE) 1 GM/10ML suspension Take 10 mLs (1 g total) by mouth 4 (four) times daily -  with meals and at bedtime. 500 mL 2   zolpidem (AMBIEN) 10 MG tablet TAKE 1 TABLET BY MOUTH AT BEDTIME AS NEEDED FOR INSOMNIA 30 tablet 3   No current facility-administered medications for this visit.   Allergies  Allergen Reactions   Ivp Dye [Iodinated Diagnostic Agents] Hives     Review of Systems: All systems reviewed and negative except where noted in HPI.     Physical Exam:    Wt Readings from Last 3 Encounters:  09/02/20 127 lb 8 oz (57.8 kg)  08/20/20 126 lb 12.8 oz (57.5 kg)  07/31/20 124 lb 6.4 oz (56.4 kg)    BP 92/62   Pulse (!) 59   Ht 5' 4"  (1.626 m)   Wt 127 lb 8 oz (57.8 kg)   LMP  (LMP Unknown)   SpO2 98%   BMI 21.89 kg/m  Constitutional:  Pleasant, in no acute distress. Psychiatric: Normal mood and affect. Behavior is normal. EENT: Pupils normal.  Conjunctivae are normal. No scleral icterus. Neck supple. No cervical LAD. Cardiovascular: Normal rate, regular rhythm. No edema Pulmonary/chest: Effort normal and breath sounds normal. No wheezing, rales or rhonchi. Abdominal: Mild TTP in b/l lower abdomen without rebound or guarding.  No peritoneal signs.  Soft, nondistended. Bowel sounds active throughout. There are no masses palpable. No hepatomegaly. Neurological: Alert and oriented to person place and time. Skin: Skin is warm and dry. No rashes noted.   ASSESSMENT AND PLAN;   1) LUQ pain 2) History of Helicobacter pylori infection 3) Heartburn  - EGD to evaluate for mucosal/luminal pathology to include  PUD, gastritis, erosive esophagitis, hiatal hernia - Continue Protonix as currently prescribed as this seems to be offering some clinical benefit - If no clear etiology with endoscopy and symptoms persist, can consider cross-sectional imaging  4) LLQ pain 5) Family history of colon cancer - Patient appropriately concerned about risk of colon cancer given her family history but essentially no CRC screening with her mother and mother's siblings, so unclear about continued genetic risk for her.  Given her active LLQ pain and family history, agree that colonoscopy is appropriate - Schedule colonoscopy  6) Family history of pancreatic cancer 7) Personal history of  ATM gene mutation (07/2018 genetic testing) - Discussed CAPS consortium and ASGE guidelines regarding Pancreatic Cancer screening.  Per current guidelines, she does not technically meet screening guidelines as she does not have a first-degree relative with pancreatic cancer.  She does have a second-degree relative and genetic mutation, and otherwise mother does not undergo any screening, so that does present a bit of a gray area.  Ultimately decided to pursue more acute work-up as above, and can revisit any changes in guidelines and/or referral to [facility] for further evaluation  Plan for expedited EGD/colonoscopy tomorrow  The indications, risks, and benefits of EGD and colonoscopy were explained to the patient in detail. Risks include but are not limited to bleeding, perforation, adverse reaction to medications, and cardiopulmonary compromise. Sequelae include but are not limited to the possibility of surgery, hospitalization, and mortality. The patient verbalized understanding and wished to proceed. All questions answered, referred to scheduler and bowel prep ordered. Further recommendations pending results of the exam.    Lavena Bullion, DO, FACG  09/02/2020, 9:51 AM   Lorrene Reid, PA-C

## 2020-09-03 ENCOUNTER — Other Ambulatory Visit: Payer: Self-pay

## 2020-09-03 ENCOUNTER — Telehealth: Payer: Self-pay | Admitting: Gastroenterology

## 2020-09-03 ENCOUNTER — Encounter: Payer: Self-pay | Admitting: Gastroenterology

## 2020-09-03 ENCOUNTER — Ambulatory Visit (AMBULATORY_SURGERY_CENTER): Payer: No Typology Code available for payment source | Admitting: Gastroenterology

## 2020-09-03 ENCOUNTER — Other Ambulatory Visit (HOSPITAL_COMMUNITY): Payer: Self-pay

## 2020-09-03 VITALS — BP 113/80 | HR 61 | Temp 98.6°F | Resp 13 | Ht 64.0 in | Wt 127.0 lb

## 2020-09-03 DIAGNOSIS — K648 Other hemorrhoids: Secondary | ICD-10-CM

## 2020-09-03 DIAGNOSIS — R1032 Left lower quadrant pain: Secondary | ICD-10-CM

## 2020-09-03 DIAGNOSIS — K2950 Unspecified chronic gastritis without bleeding: Secondary | ICD-10-CM

## 2020-09-03 DIAGNOSIS — K2970 Gastritis, unspecified, without bleeding: Secondary | ICD-10-CM

## 2020-09-03 DIAGNOSIS — R12 Heartburn: Secondary | ICD-10-CM

## 2020-09-03 DIAGNOSIS — K219 Gastro-esophageal reflux disease without esophagitis: Secondary | ICD-10-CM

## 2020-09-03 DIAGNOSIS — K644 Residual hemorrhoidal skin tags: Secondary | ICD-10-CM | POA: Diagnosis not present

## 2020-09-03 DIAGNOSIS — R1012 Left upper quadrant pain: Secondary | ICD-10-CM

## 2020-09-03 MED ORDER — SODIUM CHLORIDE 0.9 % IV SOLN: 500.0000 mL | Freq: Once | INTRAVENOUS | Status: DC

## 2020-09-03 MED ORDER — DICYCLOMINE HCL 10 MG PO CAPS
10.0000 mg | ORAL_CAPSULE | Freq: Four times a day (QID) | ORAL | 11 refills | Status: DC | PRN
  Filled 2020-09-03: qty 30, 8d supply, fill #0

## 2020-09-03 NOTE — Progress Notes (Signed)
VS by CW  I have reviewed the patient's medical history in detail and updated the computerized patient record.  

## 2020-09-03 NOTE — Op Note (Signed)
[place] Patient Name: Marissa Salazar Procedure Date: 09/03/2020 1:28 PM MRN: [id number] Endoscopist: Gerrit Heck , MD Age: 40 Referring MD:  Date of Birth: 01/06/1981 Gender: Female Account #: [id number] Procedure:                Colonoscopy Indications:              Abdominal pain in the left lower quadrant                           Family history notable for maternal grandmother                            with colon cancer at early age (63's). Medicines:                Monitored Anesthesia Care Procedure:                Pre-Anesthesia Assessment:                           - Prior to the procedure, a History and Physical                            was performed, and patient medications and                            allergies were reviewed. The patient's tolerance of                            previous anesthesia was also reviewed. The risks                            and benefits of the procedure and the sedation                            options and risks were discussed with the patient.                            All questions were answered, and informed consent                            was obtained. Prior Anticoagulants: The patient has                            taken no previous anticoagulant or antiplatelet                            agents. ASA Grade Assessment: I - A normal, healthy                            patient. After reviewing the risks and benefits,                            the patient was deemed in satisfactory condition to  undergo the procedure.                           After obtaining informed consent, the colonoscope                            was passed under direct vision. Throughout the                            procedure, the patient's blood pressure, pulse, and                            oxygen saturations were monitored continuously. The                            Olympus CF-HQ190L [id number]) Colonoscope was                             introduced through the anus and advanced to the the                            cecum, identified by appendiceal orifice and                            ileocecal valve. The colonoscopy was performed                            without difficulty. The patient tolerated the                            procedure well. The quality of the bowel                            preparation was fair. The ileocecal valve,                            appendiceal orifice, and rectum were photographed. Scope In: 1:37:33 PM Scope Out: 1:55:33 PM Scope Withdrawal Time: 0 hours 15 minutes 40 seconds  Total Procedure Duration: 0 hours 18 minutes 0 seconds  Findings:                 Skin tags were found on perianal exam.                           A moderate amount of semi-liquid stool and adherent                            debris was found in the transverse colon, in the                            ascending colon and in the cecum, interfering with                            visualization. Lavage of the area was performed  using copious amounts of tap water, resulting in                            clearance with fair visualization. Cannot rule out                            the presence of small or flat polyps in these areas.                           The visualized mucosa was otherwise normal                            appearing throughout the colon. No areas of mucosal                            erythema, edema, erosions, or ulceration noted.                           Non-bleeding internal hemorrhoids were found during                            retroflexion. The hemorrhoids were small. Complications:            No immediate complications. Estimated Blood Loss:     Estimated blood loss was minimal. Impression:               - Preparation of the colon was fair.                           - Perianal skin tags found on perianal exam.                           -  Stool in the transverse colon, in the ascending                            colon and in the cecum.                           - Normal mucosa in the entire examined colon.                           - Non-bleeding internal hemorrhoids.                           - No specimens collected. Recommendation:           - Patient has a contact number available for                            emergencies. The signs and symptoms of potential                            delayed complications were discussed with the  patient. Return to normal activities tomorrow.                            Written discharge instructions were provided to the                            patient.                           - Resume previous diet.                           - Continue present medications.                           - Repeat colonoscopy at age 62 for screening                            purposes.                           - Return to GI clinic at appointment to be                            scheduled.                           - Trial course of Bentyl 10 mg PO prn Q6 hours for                            abdominal pain. Gerrit Heck, MD 09/03/2020 2:33:58 PM

## 2020-09-03 NOTE — Telephone Encounter (Signed)
Pt called to inform that she was able to have bms after drinking prep yesterday. She stated to have called last night because she had not had a bm after taking prep. But prep worked after a little while so she will be coming to her procedure this afternoon.

## 2020-09-03 NOTE — Progress Notes (Signed)
Called to room to assist during endoscopic procedure.  Patient ID and intended procedure confirmed with present staff. Received instructions for my participation in the procedure from the performing physician.  

## 2020-09-03 NOTE — Op Note (Signed)
[place] Patient Name: Marissa Salazar Procedure Date: 09/03/2020 1:26 PM MRN: [id number] Endoscopist: Gerrit Heck , MD Age: 40 Referring MD:  Date of Birth: [birth year] Gender: Female Account #: [id number] Procedure:                Upper GI endoscopy Indications:              Abdominal pain in the left upper quadrant,                            Heartburn,Evaluation for esophageal reflux,                            Exclusion of Helicobacter pylori recurrence                           History of Helicobacter pylori infection in 08/2015.                            Recently treated with 3 separate antibiotic courses                            for possibel recurrence of H pylori. Medicines:                Monitored Anesthesia Care Procedure:                Pre-Anesthesia Assessment:                           - Prior to the procedure, a History and Physical                            was performed, and patient medications and                            allergies were reviewed. The patient's tolerance of                            previous anesthesia was also reviewed. The risks                            and benefits of the procedure and the sedation                            options and risks were discussed with the patient.                            All questions were answered, and informed consent                            was obtained. Prior Anticoagulants: The patient has                            taken no previous anticoagulant or antiplatelet  agents. ASA Grade Assessment: I - A normal, healthy                            patient. After reviewing the risks and benefits,                            the patient was deemed in satisfactory condition to                            undergo the procedure.                           After obtaining informed consent, the endoscope was                            passed under direct vision. Throughout the                             procedure, the patient's blood pressure, pulse, and                            oxygen saturations were monitored continuously. The                            GIF [id number] [id number] was introduced through the                            mouth, and advanced to the third part of duodenum.                            The upper GI endoscopy was accomplished without                            difficulty. The patient tolerated the procedure                            well. Scope In: Scope Out: Findings:                 The examined esophagus was normal.                           The Z-line was regular and was found 39 cm from the                            incisors.                           The gastroesophageal flap valve was visualized                            endoscopically and classified as Hill Grade I                            (prominent fold, tight to endoscope).  The entire examined stomach was normal. Biopsies                            were taken with a cold forceps for Helicobacter                            pylori testing. Estimated blood loss was minimal.                           The examined duodenum was normal. Biopsies were                            taken with a cold forceps for histology. Estimated                            blood loss was minimal. Complications:            No immediate complications. Estimated Blood Loss:     Estimated blood loss was minimal. Impression:               - Normal esophagus.                           - Z-line regular, 39 cm from the incisors.                           - Gastroesophageal flap valve classified as Hill                            Grade I (prominent fold, tight to endoscope).                           - Normal stomach. Biopsied.                           - Normal examined duodenum. Biopsied. Recommendation:           - Patient has a contact number available for                             emergencies. The signs and symptoms of potential                            delayed complications were discussed with the                            patient. Return to normal activities tomorrow.                            Written discharge instructions were provided to the                            patient.                           - Resume previous diet.                           -  Continue present medications.                           - Await pathology results. Gerrit Heck, MD 09/03/2020 2:11:52 PM

## 2020-09-03 NOTE — Progress Notes (Signed)
Report given to PACU, vss 

## 2020-09-03 NOTE — Patient Instructions (Signed)
Handout given for hemorrhoids.  Await pathology results.  Pick up your new medication today.  YOU HAD AN ENDOSCOPIC PROCEDURE TODAY AT [facility]:   Refer to the procedure report that was given to you for any specific questions about what was found during the examination.  If the procedure report does not answer your questions, please call your gastroenterologist to clarify.  If you requested that your care partner not be given the details of your procedure findings, then the procedure report has been included in a sealed envelope for you to review at your convenience later.  YOU SHOULD EXPECT: Some feelings of bloating in the abdomen. Passage of more gas than usual.  Walking can help get rid of the air that was put into your GI tract during the procedure and reduce the bloating. If you had a lower endoscopy (such as a colonoscopy or flexible sigmoidoscopy) you may notice spotting of blood in your stool or on the toilet paper. If you underwent a bowel prep for your procedure, you may not have a normal bowel movement for a few days.  Please Note:  You might notice some irritation and congestion in your nose or some drainage.  This is from the oxygen used during your procedure.  There is no need for concern and it should clear up in a day or so.  SYMPTOMS TO REPORT IMMEDIATELY:  Following lower endoscopy (colonoscopy or flexible sigmoidoscopy):  Excessive amounts of blood in the stool  Significant tenderness or worsening of abdominal pains  Swelling of the abdomen that is new, acute  Fever of 100F or higher  Following upper endoscopy (EGD)  Vomiting of blood or coffee ground material  New chest pain or pain under the shoulder blades  Painful or persistently difficult swallowing  New shortness of breath  Fever of 100F or higher  Black, tarry-looking stools  For urgent or emergent issues, a gastroenterologist can be reached at any hour by calling [phone]. Do not use  MyChart messaging for urgent concerns.    DIET:  We do recommend a small meal at first, but then you may proceed to your regular diet.  Drink plenty of fluids but you should avoid alcoholic beverages for 24 hours.  ACTIVITY:  You should plan to take it easy for the rest of today and you should NOT DRIVE or use heavy machinery until tomorrow (because of the sedation medicines used during the test).    FOLLOW UP: Our staff will call the number listed on your records 48-72 hours following your procedure to check on you and address any questions or concerns that you may have regarding the information given to you following your procedure. If we do not reach you, we will leave a message.  We will attempt to reach you two times.  During this call, we will ask if you have developed any symptoms of COVID 19. If you develop any symptoms (ie: fever, flu-like symptoms, shortness of breath, cough etc.) before then, please call [phone].  If you test positive for Covid 19 in the 2 weeks post procedure, please call and report this information to [place].    If any biopsies were taken you will be contacted by phone or by letter within the next 1-3 weeks.  Please call us at [phone] if you have not heard about the biopsies in 3 weeks.    SIGNATURES/CONFIDENTIALITY: You and/or your care partner have signed paperwork which will be entered into your electronic medical  record.  These signatures attest to the fact that that the information above on your After Visit Summary has been reviewed and is understood.  Full responsibility of the confidentiality of this discharge information lies with you and/or your care-partner.

## 2020-09-07 ENCOUNTER — Telehealth: Payer: Self-pay | Admitting: *Deleted

## 2020-09-07 NOTE — Telephone Encounter (Signed)
  Follow up Call-  Call back number 09/03/2020  Post procedure Call Back phone  # [phone]  Permission to leave phone message Yes  Some recent data might be hidden     Patient questions:  Do you have a fever, pain , or abdominal swelling? No. Pain Score  0 *  Have you tolerated food without any problems? Yes.    Have you been able to return to your normal activities? Yes.    Do you have any questions about your discharge instructions: Diet   No. Medications  No. Follow up visit  No.  Do you have questions or concerns about your Care? No.  Actions: * If pain score is 4 or above: No action needed, pain <4.  Have you developed a fever since your procedure? no  2.   Have you had an respiratory symptoms (SOB or cough) since your procedure? no  3.   Have you tested positive for COVID 19 since your procedure no  4.   Have you had any family members/close contacts diagnosed with the COVID 19 since your procedure?  no   If yes to any of these questions please route to Joylene John, RN and Joella Prince, RN

## 2020-09-09 ENCOUNTER — Other Ambulatory Visit: Payer: Self-pay | Admitting: Obstetrics and Gynecology

## 2020-09-09 DIAGNOSIS — N644 Mastodynia: Secondary | ICD-10-CM

## 2020-09-11 ENCOUNTER — Telehealth: Payer: Self-pay

## 2020-09-11 ENCOUNTER — Other Ambulatory Visit: Payer: Self-pay

## 2020-09-11 MED ORDER — OMEPRAZOLE 20 MG PO CPDR
20.0000 mg | DELAYED_RELEASE_CAPSULE | Freq: Every day | ORAL | 0 refills | Status: DC
  Filled 2020-09-11: qty 30, 30d supply, fill #0

## 2020-09-11 NOTE — Telephone Encounter (Signed)
Given the very mild degree of gastritis, okay to continue with omeprazole 20 mg/day for the next 4 weeks, then can titrate off completely.  There were no associated ulcers and no recurrence of H. pylori, so do not think she needs to take this in the long-term.  Thank you.

## 2020-09-11 NOTE — Telephone Encounter (Signed)
Just saw this phone call note after I sent a message shortly ago that I had reviewed her results.  Please see my separate lab note message and contact patient to review.  Thank you.

## 2020-09-11 NOTE — Telephone Encounter (Signed)
Spoke with patient in regards to recommendations. Pt requested an RX for Omeprazole be sent to her [facility] on file. Pt had no concerns at the end of the call.

## 2020-09-11 NOTE — Telephone Encounter (Signed)
Spoke with patient in regards to pathology results. Patient states that she was previously on Protonix 40 mg daily but she is now on Omeprazole 20 mg daily. Patient would like to know if you would like her to continue Omeprazole at this dose or would you like her on an alternative medication for the gastritis? Please advise, thanks.

## 2020-09-11 NOTE — Telephone Encounter (Signed)
Patient has seen her path report in her [facility] and wants to discuss the results please.

## 2020-09-16 ENCOUNTER — Ambulatory Visit: Payer: No Typology Code available for payment source | Admitting: Dermatology

## 2020-09-18 ENCOUNTER — Other Ambulatory Visit: Payer: Self-pay

## 2020-09-18 ENCOUNTER — Ambulatory Visit
Admission: RE | Admit: 2020-09-18 | Discharge: 2020-09-18 | Disposition: A | Discharge status: Home / Self Care | Payer: No Typology Code available for payment source | Source: Ambulatory Visit | Attending: Obstetrics and Gynecology | Admitting: Obstetrics and Gynecology

## 2020-09-18 DIAGNOSIS — N644 Mastodynia: Secondary | ICD-10-CM

## 2020-10-27 ENCOUNTER — Other Ambulatory Visit: Payer: Self-pay

## 2020-11-05 ENCOUNTER — Encounter: Payer: Self-pay | Admitting: Nurse Practitioner

## 2020-11-05 ENCOUNTER — Other Ambulatory Visit: Payer: Self-pay

## 2020-11-05 ENCOUNTER — Ambulatory Visit (INDEPENDENT_AMBULATORY_CARE_PROVIDER_SITE_OTHER): Payer: No Typology Code available for payment source | Admitting: Nurse Practitioner

## 2020-11-05 VITALS — BP 89/61 | HR 110 | Temp 98.0°F | Ht 64.0 in | Wt 123.2 lb

## 2020-11-05 DIAGNOSIS — R220 Localized swelling, mass and lump, head: Secondary | ICD-10-CM | POA: Diagnosis not present

## 2020-11-05 DIAGNOSIS — R10814 Left lower quadrant abdominal tenderness: Secondary | ICD-10-CM

## 2020-11-05 DIAGNOSIS — R7989 Other specified abnormal findings of blood chemistry: Secondary | ICD-10-CM | POA: Diagnosis not present

## 2020-11-05 DIAGNOSIS — R5383 Other fatigue: Secondary | ICD-10-CM | POA: Diagnosis not present

## 2020-11-05 MED ORDER — METHYLPREDNISOLONE 4 MG PO TBPK
ORAL_TABLET | ORAL | 0 refills | Status: DC
  Filled 2020-11-05: qty 21, 6d supply, fill #0

## 2020-11-05 NOTE — Progress Notes (Signed)
Acute Office Visit  Subjective:    Patient ID: Marissa Salazar, female    DOB: 08-25-1980, 40 y.o.   MRN: [id number]  Chief Complaint  Patient presents with   GI Problem    GI Problem The primary symptoms include fatigue, abdominal pain and nausea. Primary symptoms do not include fever, vomiting, diarrhea, dysuria, myalgias, arthralgias or rash. The illness began more than 7 days ago. The onset was gradual. The problem has not changed since onset. The illness is also significant for anorexia. The illness does not include chills, constipation or back pain. Significant associated medical issues include GERD and irritable bowel syndrome.  Patient is in today for acute visit. She states that she has several strange symptoms that have been going of for a long time however, they are getting worse over the past few months. She states that during the afternoons, she gets flushed and swollen in her face. Though facial swelling is happening all the time, it is worse in the afternoons and evenings. She gets very tired.  She recently saw her GYN provider. They did run full set of labs. TSH was low normal as were the T3 and Free T4. Cortisol level was also minimally abnormal. She continues to have intermittent abdominal pain. She did have colonoscopy and endoscopy since her last visit with me. Colonoscopy showed some non bleeding internal hemorrhoids. And endoscopy was essentially normal. She does have a follow up appointment with GI next week. She does have nausea and dizziness. Denies vomiting or diarrhea. She has some intermittent constipation.   Past Medical History:  Diagnosis Date   Abnormal Pap smear    in past; normal colpo   Allergic rhinitis    Allergy    Anemia    Hx fibroids   Blood transfusion without reported diagnosis 05/2019   Dysplastic nevus 10/05/2016   R infra axillary, mild atypia   Family history of breast cancer    Family history of colon cancer    Family history of pancreatic  cancer    Fibroid    Iron deficiency anemia due to chronic blood loss    Superficial vein thrombosis 2019   Left leg   Uterine fibroid 05/09/2019   sonohysterogram showing 7.8 x 7.4 cm fibroid with 4.6 cm in endometrial cavity - Dr. [name]    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   BUNIONECTOMY Right 2013   COLPOSCOPY  2009   ESOPHAGOGASTRODUODENOSCOPY N/A 07/17/2015   Procedure: ESOPHAGOGASTRODUODENOSCOPY (EGD);  Surgeon: Manya Silvas, MD;  Location: [facility] ENDOSCOPY;  Service: Endoscopy;  Laterality: N/A;    Family History  Problem Relation Age of Onset   Colon cancer Maternal Grandmother 81   Breast cancer Maternal Grandmother 54   Hypertension Maternal Grandmother    Breast cancer Maternal Aunt 60       BRCA negative   Pancreatic cancer Maternal Aunt 44   Heart attack Maternal Uncle    Diabetes Paternal Uncle    Breast cancer Other        GP   Lung cancer Other        GP   Esophageal cancer Neg Hx    Liver cancer Neg Hx    Stomach cancer Neg Hx     Social History   Socioeconomic History   Marital status: Divorced    Spouse name: Not on file   Number of children: 2   Years of education: Not on  file   Highest education level: Not on file  Occupational History   Occupation: Student  Tobacco Use   Smoking status: Never   Smokeless tobacco: Never  Vaping Use   Vaping Use: Never used  Substance and Sexual Activity   Alcohol use: Yes    Alcohol/week: 0.0 standard drinks    Comment: Rare   Drug use: No   Sexual activity: Not Currently    Birth control/protection: None  Other Topics Concern   Not on file  Social History Narrative   Married      [name]; 36 y/o son      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     cyclobenzaprine (FLEXERIL) 10 MG tablet Take 1 tablet (10 mg total) by mouth 3 (three) times daily as needed for muscle spasms. 30 tablet 0   Multiple Vitamin (MULTIVITAMIN) tablet Take 1 tablet by mouth daily.     zolpidem (AMBIEN) 10 MG tablet TAKE 1 TABLET BY MOUTH AT BEDTIME AS NEEDED FOR INSOMNIA 30 tablet 3   dicyclomine (BENTYL) 10 MG capsule Take 1 capsule (10 mg total) by mouth every 6 (six) hours as needed for spasms. 30 capsule 11   metroNIDAZOLE (FLAGYL) 500 MG tablet Take 1 tablet (500 mg total) by mouth 3 (three) times daily. 21 tablet 0   omeprazole (PRILOSEC) 20 MG capsule Take 1 capsule (20 mg total) by mouth daily. 30 capsule 0   pantoprazole (PROTONIX) 40 MG tablet Take 1 tablet (40 mg total) by mouth daily. 30 tablet 3   sucralfate (CARAFATE) 1 GM/10ML suspension Take 10 mLs (1 g total) by mouth 4 (four) times daily -  with meals and at bedtime. 500 mL 2   No facility-administered medications prior to visit.    Allergies  Allergen Reactions   Ivp Dye [Iodinated Diagnostic Agents] Hives    Review of Systems  Constitutional:  Positive for appetite change and fatigue. Negative for activity change, chills and fever.  HENT:  Positive for facial swelling. Negative for congestion, postnasal drip, rhinorrhea, sinus pressure, sinus pain, sneezing and sore throat.   Eyes: Negative.   Respiratory:  Negative for cough, chest tightness, shortness of breath and wheezing.   Cardiovascular:  Negative for chest pain and palpitations.  Gastrointestinal:  Positive for abdominal pain, anorexia and nausea. Negative for constipation, diarrhea and vomiting.       She reports most of her abdominal discomfort is left lower quadrant.   Endocrine: Negative for cold intolerance, heat intolerance, polydipsia and polyuria.  Genitourinary:  Negative for dyspareunia, dysuria, flank pain, frequency and urgency.  Musculoskeletal:  Negative for arthralgias, back pain and myalgias.  Skin:  Negative for rash.       Skin flushing of the face mostly in the afternoon.  Allergic/Immunologic: Negative for environmental allergies.  Neurological:  Negative for dizziness, weakness and headaches.  Hematological:  Negative for adenopathy.  Psychiatric/Behavioral:  The patient is not nervous/anxious.       Objective:  Physical Exam Vitals and nursing note reviewed.  Constitutional:      Appearance: Normal appearance. She is well-developed.  HENT:     Head: Normocephalic and atraumatic.     Nose: Nose normal.  Eyes:     Pupils: Pupils are equal, round, and reactive to light.  Cardiovascular:     Rate and Rhythm: Normal rate and regular rhythm.     Pulses: Normal pulses.     Heart sounds: Normal heart sounds.  Pulmonary:     Effort: Pulmonary effort is normal.     Breath sounds: Normal breath sounds.  Abdominal:     General: Bowel sounds are normal. There is no distension.     Palpations: Abdomen is soft. There is no mass.     Tenderness: There is abdominal tenderness. There is no guarding or rebound.     Hernia: No hernia is present.  Musculoskeletal:        General: Normal range of motion.     Cervical back: Normal range of motion and neck supple.  Lymphadenopathy:     Cervical: No cervical adenopathy.  Skin:    General: Skin is warm and dry.     Capillary Refill: Capillary refill takes less than 2 seconds.  Neurological:     General: No focal deficit present.     Mental Status: She is alert and oriented to person, place, and time.  Psychiatric:        Mood and Affect: Mood normal.        Behavior: Behavior normal.        Thought Content: Thought content normal.        Judgment: Judgment normal.   Today's Vitals    11/05/20 0906  BP: (!) 89/61  Pulse: (!) 110  Temp: 98 F (36.7 C)  SpO2: 99%  Weight: 123 lb 3.2 oz (55.9 kg)  Height: 5' 4"  (1.626 m)   Body mass index is 21.15 kg/m.   Wt Readings from Last 3 Encounters:  11/05/20 123 lb 3.2 oz (55.9 kg)  09/03/20 127 lb (57.6 kg)  09/02/20 127 lb 8 oz (57.8 kg)    Health Maintenance Due  Topic Date Due   Hepatitis C Screening  Never done   COVID-19 Vaccine (3 - Pfizer risk series) 06/29/2019   TETANUS/TDAP  08/22/2020   INFLUENZA VACCINE  09/07/2020    There are no preventive care reminders to display for this patient.   Lab Results  Component Value Date   TSH 1.510 05/07/2020   Lab Results  Component Value Date   WBC 5.2 07/31/2020   HGB 14.0 07/31/2020   HCT 41.9 07/31/2020   MCV 90 07/31/2020   PLT 250 07/31/2020   Lab Results  Component Value Date   NA 138 07/31/2020   K 4.5 07/31/2020   CO2 25 07/31/2020   GLUCOSE 71 07/31/2020   BUN 16 07/31/2020   CREATININE 0.79 07/31/2020   BILITOT 0.4 05/07/2020   ALKPHOS 54 05/07/2020   AST 31 05/07/2020   ALT 24 05/07/2020   PROT 7.5 05/07/2020   ALBUMIN 4.6 05/07/2020   CALCIUM 9.5 07/31/2020   ANIONGAP 5 11/25/2019   EGFR 99 07/31/2020   GFR 81.83 12/25/2017   Lab Results  Component Value Date   CHOL 213 (H) 05/07/2020   Lab Results  Component Value Date   HDL 132 05/07/2020   Lab Results  Component Value Date   LDLCALC 72 05/07/2020   Lab Results  Component Value Date  TRIG 46 05/07/2020   Lab Results  Component Value Date   CHOLHDL 1.6 05/07/2020   Lab Results  Component Value Date   HGBA1C 5.0 07/31/2020       Assessment & Plan:  1. Elevated cortisol level Labs drawn her GYN provider showed mildly elevated cortisol level.  May be contributing to symptoms.  Will refer to endocrinology for further evaluation. - Ambulatory referral to Endocrinology  2. Facial swelling Trial Medrol taper.  Take as directed for 6 days.  Reassess at next visit  in 2 weeks. - methylPREDNISolone (MEDROL) 4 MG TBPK tablet; Take by mouth as directed for 6 days  Dispense: 21 tablet; Refill: 0  3. Fatigue, unspecified type Increased fatigue.  Reviewed labs from her GYN provider.  TSH 0.9 with Free T3 and T4 at low-normal levels.  Unsure if this is contributing to fatigue, but will refer to endocrinology for further evaluation.  4. Left lower quadrant abdominal tenderness without rebound tenderness Reviewed results of patient's recent colonoscopy and endoscopy.  Colonoscopy showed some nonbleeding, internal hemorrhoids without other abnormalities.  Patient does have follow-up appointment with GI provider 11/10/2020.  She also sees GYN provider in 2 weeks.  Problem List Items Addressed This Visit       Other   Elevated cortisol level - Primary   Relevant Orders   Ambulatory referral to Endocrinology   Facial swelling   Relevant Medications   methylPREDNISolone (MEDROL) 4 MG TBPK tablet   Fatigue   Left lower quadrant abdominal tenderness without rebound tenderness     Meds ordered this encounter  Medications   methylPREDNISolone (MEDROL) 4 MG TBPK tablet    Sig: Take by mouth as directed for 6 days    Dispense:  21 tablet    Refill:  0    Order Specific Question:   Supervising Provider    Answer:   Beatrice Lecher D [[id number]]   This note was dictated using Dragon Voice Recognition Software. Rapid proofreading was performed to expedite the delivery of the information. Despite proofreading, phonetic errors will occur which are common with this voice recognition software. Please take this into consideration. If there are any concerns, please contact our office.     Ronnell Freshwater, NP

## 2020-11-06 ENCOUNTER — Other Ambulatory Visit (HOSPITAL_COMMUNITY): Payer: Self-pay

## 2020-11-06 ENCOUNTER — Other Ambulatory Visit: Payer: Self-pay | Admitting: Nurse Practitioner

## 2020-11-06 DIAGNOSIS — R112 Nausea with vomiting, unspecified: Secondary | ICD-10-CM

## 2020-11-06 MED ORDER — ESCITALOPRAM OXALATE 10 MG PO TABS
10.0000 mg | ORAL_TABLET | Freq: Every day | ORAL | 12 refills | Status: DC
  Filled 2020-11-06: qty 30, 30d supply, fill #0

## 2020-11-06 NOTE — Progress Notes (Signed)
Order placed for CT abdomen and pelvis for patient due to persistent abdominal pain with nausea, vomiting, and episodes of diarrhea

## 2020-11-09 ENCOUNTER — Other Ambulatory Visit: Payer: Self-pay

## 2020-11-09 ENCOUNTER — Telehealth: Payer: Self-pay

## 2020-11-09 MED ORDER — PREDNISONE 50 MG PO TABS
50.0000 mg | ORAL_TABLET | ORAL | 0 refills | Status: DC
  Filled 2020-11-09: qty 3, 1d supply, fill #0

## 2020-11-09 NOTE — Telephone Encounter (Signed)
Phone call to patient to review instructions for 13 hr prep for CT w/ contrast on 11/17/20 at 2:00 PM. Prescription called into [facility]. Pt aware and verbalized understanding of instructions. Prescription: 10/11 @ 1:00 AM- 50mg  Prednisone 10/11 @ 7:00 AM- 50mg  Prednisone 10/11 @ 1:00 PM - 50mg  Prednisone and 50mg  Benadryl   Pt did not want me to call benadryl in as a prescription as she states she has benadryl at home. Advised pt to have a driver the day of taking benadryl as it may cause drowsiness.

## 2020-11-10 ENCOUNTER — Other Ambulatory Visit: Payer: Self-pay

## 2020-11-10 ENCOUNTER — Ambulatory Visit
Admission: RE | Admit: 2020-11-10 | Discharge: 2020-11-10 | Disposition: A | Discharge status: Home / Self Care | Payer: No Typology Code available for payment source | Source: Ambulatory Visit | Attending: Nurse Practitioner | Admitting: Nurse Practitioner

## 2020-11-10 ENCOUNTER — Ambulatory Visit: Payer: No Typology Code available for payment source | Admitting: Gastroenterology

## 2020-11-10 DIAGNOSIS — R112 Nausea with vomiting, unspecified: Secondary | ICD-10-CM

## 2020-11-10 MED ORDER — DIPHENHYDRAMINE HCL 50 MG PO CAPS: 50.0000 mg | ORAL_CAPSULE | Freq: Once | ORAL | Status: DC

## 2020-11-10 MED ORDER — PREDNISONE 50 MG PO TABS: 50.0000 mg | ORAL_TABLET | Freq: Four times a day (QID) | ORAL | Status: DC

## 2020-11-10 MED ORDER — DIPHENHYDRAMINE HCL 50 MG/ML IJ SOLN: 50.0000 mg | Freq: Once | INTRAMUSCULAR | Status: DC

## 2020-11-10 MED ORDER — IOPAMIDOL (ISOVUE-300) INJECTION 61%
100.0000 mL | Freq: Once | INTRAVENOUS | Status: AC | PRN
  Administered 2020-11-10: 100 mL via INTRAVENOUS

## 2020-11-12 NOTE — Progress Notes (Signed)
Discuss results with patient at visit 11/20/2020

## 2020-11-12 NOTE — Telephone Encounter (Signed)
Was there a way we could move up her CT Abdomen and pelvis?

## 2020-11-16 ENCOUNTER — Other Ambulatory Visit: Payer: Self-pay | Admitting: Nurse Practitioner

## 2020-11-16 DIAGNOSIS — R519 Headache, unspecified: Secondary | ICD-10-CM

## 2020-11-16 DIAGNOSIS — G8929 Other chronic pain: Secondary | ICD-10-CM

## 2020-11-16 DIAGNOSIS — R42 Dizziness and giddiness: Secondary | ICD-10-CM

## 2020-11-16 NOTE — Progress Notes (Signed)
Due to chronic headaches with new features, including dizziness, ordered MRI of head w/o contrast.

## 2020-11-17 ENCOUNTER — Other Ambulatory Visit: Payer: Self-pay

## 2020-11-17 ENCOUNTER — Encounter: Payer: Self-pay | Admitting: Gastroenterology

## 2020-11-17 ENCOUNTER — Other Ambulatory Visit: Payer: No Typology Code available for payment source

## 2020-11-17 ENCOUNTER — Ambulatory Visit (INDEPENDENT_AMBULATORY_CARE_PROVIDER_SITE_OTHER): Payer: No Typology Code available for payment source | Admitting: Gastroenterology

## 2020-11-17 VITALS — BP 98/68 | HR 79 | Ht 64.0 in | Wt 125.0 lb

## 2020-11-17 DIAGNOSIS — R110 Nausea: Secondary | ICD-10-CM

## 2020-11-17 DIAGNOSIS — R519 Headache, unspecified: Secondary | ICD-10-CM

## 2020-11-17 DIAGNOSIS — R232 Flushing: Secondary | ICD-10-CM | POA: Diagnosis not present

## 2020-11-17 DIAGNOSIS — K5900 Constipation, unspecified: Secondary | ICD-10-CM | POA: Diagnosis not present

## 2020-11-17 DIAGNOSIS — R1032 Left lower quadrant pain: Secondary | ICD-10-CM

## 2020-11-17 DIAGNOSIS — R5383 Other fatigue: Secondary | ICD-10-CM

## 2020-11-17 NOTE — Patient Instructions (Signed)
If you are age 38 or older, your body mass index should be between 23-30. Your Body mass index is 21.46 kg/m. If this is out of the aforementioned range listed, please consider follow up with your Primary Care Provider.  If you are age 39 or younger, your body mass index should be between 19-25. Your Body mass index is 21.46 kg/m. If this is out of the aformentioned range listed, please consider follow up with your Primary Care Provider.   __________________________________________________________  The [facility] providers would like to encourage you to use [facility] to communicate with providers for non-urgent requests or questions.  Due to long hold times on the telephone, sending your provider a message by [facility] may be a faster and more efficient way to get a response.  Please allow 48 business hours for a response.  Please remember that this is for non-urgent requests.   Take Miralax 1 capful mixed in 8 ounces of water at bed time for constipation as tolerated.   Due to recent changes in healthcare laws, you may see the results of your imaging and laboratory studies on MyChart before your provider has had a chance to review them.  We understand that in some cases there may be results that are confusing or concerning to you. Not all laboratory results come back in the same time frame and the provider may be waiting for multiple results in order to interpret others.  Please give [place] 48 hours in order for your provider to thoroughly review all the results before contacting the office for clarification of your results.   Thank you for choosing me and [facility].  Vito Cirigliano, D.O.

## 2020-11-17 NOTE — Progress Notes (Signed)
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   Evaluation to date: - 07/17/2015: EGD with H. pylori gastritis diagnosed by GI at [facility] clinic, improved with antimicrobial therapy.  -07/21/2020: Urgent care evaluation for evaluation of sinus congestion and sore throat.  Normal CBC and BMP.  Was treated with Augmentin.  - 07/31/2020: Follow-up with her PCM from UC. c/o heartburn, diarrhea.  Reflux symptoms worse at night.  Was treated with Biaxin, lansoprazole in addition to continued Augmentin for possible H. pylori.  - 08/20/2020: Follow-up with her PCM. She reported not feeling much better after treatment with amoxicillin, Biaxin, and lansoprazole.  Was then treated with metronidazole 500 mg bid, sucralfate.  Celiac panel negative. - H. pylori negative (although notes say H pylori IgM positive, that was actually the result from 5 years ago) - TTG, antigliadin negative  - 09/02/2020: Evaluation GI clinic.  Minimal change in symptoms with above interventions. - 09/03/2020: EGD: Normal.  Benign gastric/duodenal biopsies - 08/26/2020: Colonoscopy: Skin tags, fair prep but otherwise normal appearing colon mucosa (path benign).  Small internal hemorrhoids.  Repeat age 48 for CRC screening.  Trialed course of Bentyl vs   -11/10/2020: CT Abd/pelvis: 2.8 cm left ovarian cyst, incidental benign hepatic lesions (benign cysts vs biliary hamartomas) with otherwise normal liver, pancreas, GI tract    Family history notable for MGM with Colon CA (died at 43) and Maternal Aunt with Pancreatic CA (died at 58). Mother gets no screening.  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 female presenting to the Gastroenterology Clinic for follow-up.  Initially seen by me on 09/02/2020 with subsequent EGD/colonoscopy as outlined above.  She underwent CT earlier this month and would like to review those results today.   Was seen in the Endocrinology Clinic yesterday for evaluation of flushing/abdominal pain.  Checking thyroid function panel, 24-hour 5-HIAA, serum tryptase.  Main issue today is of episodic facial flushing, ongoing intermittent LLQ pain with constipation, along with mild nausea, headache, dizziness.  Will use laxative prn for constipation with relief.  Pain tends to improve with BM.  No longer with any reflux sxs and has weaned off meds without recurrence.   Review of systems:     No chest pain, no SOB, no fevers, no urinary sx   Past Medical History:  Diagnosis Date   Abnormal Pap smear    in past; normal colpo   Allergic rhinitis    Allergy    Anemia    Hx fibroids   Blood transfusion without reported diagnosis 05/2019   Dysplastic nevus 10/05/2016   R infra axillary, mild atypia   Family history of breast cancer    Family history of colon cancer    Family history of pancreatic cancer    Fibroid    Iron deficiency anemia due to chronic blood loss    Superficial vein thrombosis 2019   Left leg   Uterine fibroid 05/09/2019   sonohysterogram showing 7.8 x 7.4 cm fibroid with 4.6 cm in endometrial cavity - Dr. [name]    Patient's surgical history, family medical history, social history, medications and allergies were all reviewed in Epic    Current  Outpatient Medications  Medication Sig Dispense Refill   cholecalciferol (VITAMIN D3) 25 MCG (1000 UNIT) tablet Take 1,000 Units by mouth daily.     Multiple Vitamin (MULTIVITAMIN) tablet Take 1 tablet by mouth daily.     zolpidem (AMBIEN) 10 MG tablet TAKE 1 TABLET BY MOUTH AT BEDTIME AS NEEDED FOR INSOMNIA 30 tablet 3   No current facility-administered medications for this visit.    Physical  Exam:     LMP  (LMP Unknown)   GENERAL:  Pleasant female in NAD PSYCH: : Cooperative, normal affect EENT:  conjunctiva pink, mucous membranes moist, neck supple without masses CARDIAC:  RRR, no murmur heard, no peripheral edema PULM: Normal respiratory effort, lungs CTA bilaterally, no wheezing ABDOMEN: Minimal TTP in LLQ without rebound or guarding.  Nondistended, soft. No obvious masses, no hepatomegaly,  normal bowel sounds SKIN:  turgor, no lesions seen Musculoskeletal:  Normal muscle tone, normal strength NEURO: Alert and oriented x 3, no focal neurologic deficits   IMPRESSION and PLAN:    1) LLQ pain 2) Constipation 3) Facial flushing 4) Fatigue 5) Headaches 6) Nausea without emesis  Extended work-up has not been revealing for unifying diagnosis for her myriad of symptoms.  Not entirely clear if she has constipation unrelated to her systemic symptoms or as a part of constellation of symptomatology.  Plan as follows:  - Agree with her Endocrinologist and sending off 5 HIAA and serum tryptase for work-up of carcinoid and mastocytosis, respectively. - Did discuss the possibility of porphyria and while symptoms would be a bit atypical of this rare disorder, will plan to send labs as below - Check spot urine for porphobilinogen (PBG), total porphyrins, and urine creatinine.  (PBG results should be expressed as PBG per gram of urine creatinine) - Trial MiraLAX 1 cap/day and titrate to soft stools without straining to have BM - Continue adequate hydration with at least 64 ounces of water/day - May consider referral to Cardiology to r/o POTS - Agree with consideration for referral to Neurology as well - Has appointment with GYN to discuss ovarian cyst - Continue p.o. intake as tolerated with 6 small meals/day with low-fat foods  RTC in 3 months or sooner as needed     I spent 35 minutes of time, including in depth chart review, independent review of results as outlined above,  communicating results with the patient directly, face-to-face time with the patient, coordinating care, and ordering studies and medications as appropriate, and documentation.    Dresser ,DO, FACG 11/17/2020, 11:25 AM

## 2020-11-17 NOTE — Telephone Encounter (Signed)
Results reviewed in person during appointment today.

## 2020-11-18 ENCOUNTER — Telehealth: Payer: Self-pay | Admitting: General Surgery

## 2020-11-18 ENCOUNTER — Other Ambulatory Visit: Payer: Self-pay | Admitting: General Surgery

## 2020-11-18 DIAGNOSIS — R1012 Left upper quadrant pain: Secondary | ICD-10-CM

## 2020-11-18 DIAGNOSIS — R232 Flushing: Secondary | ICD-10-CM

## 2020-11-18 DIAGNOSIS — R1032 Left lower quadrant pain: Secondary | ICD-10-CM

## 2020-11-18 DIAGNOSIS — R1013 Epigastric pain: Secondary | ICD-10-CM

## 2020-11-18 DIAGNOSIS — R5383 Other fatigue: Secondary | ICD-10-CM

## 2020-11-18 NOTE — Telephone Encounter (Signed)
Spoke with Marissa Salazar and she is aware of the labs we would like her to have, she asked if they would be able to draw [facility] and I told her yes. Orders placed patient verbalized understanding.

## 2020-11-18 NOTE — Telephone Encounter (Signed)
-----   Message from [place], DO sent at 11/18/2020 10:12 AM EDT ----- I thought about this case more, and she and I discussed possible additional labs, and I think we should just pull the trigger and order those now since we still dont have a clear dx. Please call patient to update her and order the following:  - Check spot urine for porphobilinogen (PBG), total porphyrins, and urine creatinine.   - Please call the lab to ensure the proper collection methods (ie, does this need to be collected in a container and covered in tin foil to protect from sunlight, etc).   Thanks.

## 2020-11-18 NOTE — Telephone Encounter (Signed)
error 

## 2020-11-18 NOTE — Addendum Note (Signed)
Addended by: Lanny Hurst A on: 11/18/2020 04:45 PM   Modules accepted: Orders

## 2020-11-18 NOTE — Telephone Encounter (Signed)
Patient is unable to have the urine test drawn at any place but the hospital. [facility] is unable to get in the specific urine cup, called [facility] they do not have it either.  I do not have access to place hospital orders for labs. Is this something that you can do? It is for the porphobilinogen, random urine.

## 2020-11-18 NOTE — Addendum Note (Signed)
Addended by: Lanny Hurst A on: 11/18/2020 04:06 PM   Modules accepted: Orders

## 2020-11-18 NOTE — Telephone Encounter (Signed)
The urinary porphyrin does not actually have to be a 24-hour collection, but a spot collection is just fine by guidelines.  I would wait on ordering an octreotide scan until 5-HIAA lab is back.

## 2020-11-19 ENCOUNTER — Ambulatory Visit: Payer: No Typology Code available for payment source | Admitting: Dermatology

## 2020-11-19 NOTE — Telephone Encounter (Signed)
Went to the lab next door at [facility] and they had the urine cup needed for the porphobilinigen urine test. Contacted the patient and she will come to the clinic today to take the test.

## 2020-11-20 ENCOUNTER — Ambulatory Visit: Payer: No Typology Code available for payment source | Admitting: Nurse Practitioner

## 2020-11-20 ENCOUNTER — Telehealth: Payer: Self-pay | Admitting: Nurse Practitioner

## 2020-11-20 ENCOUNTER — Other Ambulatory Visit: Payer: Self-pay | Admitting: Nurse Practitioner

## 2020-11-20 ENCOUNTER — Telehealth: Payer: Self-pay | Admitting: General Surgery

## 2020-11-20 DIAGNOSIS — R42 Dizziness and giddiness: Secondary | ICD-10-CM

## 2020-11-20 DIAGNOSIS — G8929 Other chronic pain: Secondary | ICD-10-CM

## 2020-11-20 DIAGNOSIS — R519 Headache, unspecified: Secondary | ICD-10-CM

## 2020-11-20 NOTE — Progress Notes (Signed)
New order placed for MRI of the brain with and without  contrast for [facility].

## 2020-11-20 NOTE — Telephone Encounter (Signed)
Patient's insurance company has been contacted and PA has been obtained.

## 2020-11-20 NOTE — Telephone Encounter (Signed)
LVM for patient to advise me if she is coming in today to pick up urine cup for her lab.

## 2020-11-20 NOTE — Telephone Encounter (Signed)
Patient called into the office to let Nira Conn know the MRI is supposed to be with contrast.  The current referral placed is without.  Can a new referral be put in with contrast, prior authorization done and sent to [facility] as soon as possible.  Patient is trying to have this completed before she goes back to Neurology.

## 2020-11-20 NOTE — Telephone Encounter (Signed)
Hey. I just placed a new order placed for MRI of the brain with and without  contrast for [facility].

## 2020-11-25 ENCOUNTER — Inpatient Hospital Stay: Admission: RE | Admit: 2020-11-25 | Payer: No Typology Code available for payment source | Source: Ambulatory Visit

## 2020-11-25 ENCOUNTER — Other Ambulatory Visit: Payer: Self-pay

## 2020-11-25 ENCOUNTER — Ambulatory Visit (INDEPENDENT_AMBULATORY_CARE_PROVIDER_SITE_OTHER): Payer: No Typology Code available for payment source | Admitting: Nurse Practitioner

## 2020-11-25 ENCOUNTER — Encounter: Payer: Self-pay | Admitting: Nurse Practitioner

## 2020-11-25 VITALS — BP 102/68 | HR 77 | Temp 97.9°F | Ht 64.0 in | Wt 127.9 lb

## 2020-11-25 DIAGNOSIS — N83202 Unspecified ovarian cyst, left side: Secondary | ICD-10-CM | POA: Diagnosis not present

## 2020-11-25 DIAGNOSIS — D3912 Neoplasm of uncertain behavior of left ovary: Secondary | ICD-10-CM | POA: Diagnosis not present

## 2020-11-25 DIAGNOSIS — R10814 Left lower quadrant abdominal tenderness: Secondary | ICD-10-CM

## 2020-11-25 NOTE — Progress Notes (Signed)
Established Patient Office Visit  Subjective:  Patient ID: Marissa Salazar, female    DOB: Jun 26, 1980  Age: 40 y.o. MRN: [id number]  CC:  Chief Complaint  Patient presents with   Follow-up    HPI Marissa Salazar presents for follow up visit. She continues to have several strange symptoms that have been going of for a long time however, they are getting worse over the past few months. She states that during the afternoons, she gets flushed and swollen in her face. Though facial swelling is happening all the time, it is worse in the afternoons and evenings. She gets very tired.  She recently saw her GYN provider. They did run full set of labs. TSH was low normal as were the T3 and Free T4. Cortisol level was also minimally abnormal. She continues to have intermittent abdominal pain. She has started feeling bone pain and pain in lower back. CT of abdomen and pelvis did show a 2.8cm simple cyst on the left ovary. The abdominal pain and most severe pain is in left lower quadrant of the abdomen. Recently did see endocrinology. They have collected a 24 hour urine sample. She is awaiting results to determine plan for treatment and follow up. An ultrasound of the pelvis was done in July, 2022, and she was not made aware of cyst or other mass/lesion present on the ovary.   Past Medical History:  Diagnosis Date   Abnormal Pap smear    in past; normal colpo   Allergic rhinitis    Allergy    Anemia    Hx fibroids   Blood transfusion without reported diagnosis 05/2019   Dysplastic nevus 10/05/2016   R infra axillary, mild atypia   Family history of breast cancer    Family history of colon cancer    Family history of pancreatic cancer    Fibroid    Iron deficiency anemia due to chronic blood loss    Superficial vein thrombosis 2019   Left leg   Uterine fibroid 05/09/2019   sonohysterogram showing 7.8 x 7.4 cm fibroid with 4.6 cm in endometrial cavity - Dr. [name]    Past Surgical History:   Procedure Laterality Date   BREAST BIOPSY Left 06-12-14   fibroadenoma   BUNIONECTOMY Right 2013   COLPOSCOPY  2009   ESOPHAGOGASTRODUODENOSCOPY N/A 07/17/2015   Procedure: ESOPHAGOGASTRODUODENOSCOPY (EGD);  Surgeon: Manya Silvas, MD;  Location: [facility] ENDOSCOPY;  Service: Endoscopy;  Laterality: N/A;    Family History  Problem Relation Age of Onset   Colon cancer Maternal Grandmother 59   Breast cancer Maternal Grandmother 54   Hypertension Maternal Grandmother    Breast cancer Maternal Aunt 60       BRCA negative   Pancreatic cancer Maternal Aunt 44   Heart attack Maternal Uncle    Diabetes Paternal Uncle    Breast cancer Other        GP   Lung cancer Other        GP   Esophageal cancer Neg Hx    Liver cancer Neg Hx    Stomach cancer Neg Hx     Social History   Socioeconomic History   Marital status: Divorced    Spouse name: Not on file   Number of children: 2   Years of education: Not on file   Highest education level: Not on file  Occupational History   Occupation: Student  Tobacco Use   Smoking status: Never   Smokeless tobacco: Never  Vaping Use   Vaping Use: Never used  Substance and Sexual Activity   Alcohol use: Yes    Alcohol/week: 0.0 standard drinks    Comment: Rare   Drug use: No   Sexual activity: Not Currently    Birth control/protection: None  Other Topics Concern   Not on file  Social History Narrative   Married      [name]; 38 y/o son      S[employer]; [employer] program      Regular exercise         Social Determinants of Health   Financial Resource Strain: Not on file  Food Insecurity: Not on file  Transportation Needs: Not on file  Physical Activity: Not on file  Stress: Not on file  Social Connections: Not on file  Intimate Partner Violence: Not on file    Outpatient Medications Prior to Visit  Medication Sig Dispense Refill   cholecalciferol (VITAMIN D3) 25 MCG (1000 UNIT) tablet Take 1,000 Units by mouth daily.     Multiple  Vitamin (MULTIVITAMIN) tablet Take 1 tablet by mouth daily.     zolpidem (AMBIEN) 10 MG tablet TAKE 1 TABLET BY MOUTH AT BEDTIME AS NEEDED FOR INSOMNIA 30 tablet 3   No facility-administered medications prior to visit.    Allergies  Allergen Reactions   Ivp Dye [Iodinated Diagnostic Agents] Hives    ROS Review of Systems  Constitutional:  Positive for appetite change and fatigue. Negative for activity change, chills and fever.  HENT:  Positive for facial swelling. Negative for congestion, postnasal drip, rhinorrhea, sinus pressure, sinus pain, sneezing and sore throat.   Eyes: Negative.   Respiratory:  Negative for cough, chest tightness, shortness of breath and wheezing.   Cardiovascular:  Negative for chest pain and palpitations.  Gastrointestinal:  Positive for abdominal pain and nausea. Negative for constipation, diarrhea and vomiting.       She reports most of her abdominal discomfort is left lower quadrant.  Endocrine: Negative for cold intolerance, heat intolerance, polydipsia and polyuria.  Genitourinary:  Negative for dyspareunia, dysuria, flank pain, frequency and urgency.  Musculoskeletal:  Negative for arthralgias, back pain and myalgias.  Skin:  Negative for rash.       Skin flushing of the face mostly in the afternoon.  Allergic/Immunologic: Negative for environmental allergies.  Neurological:  Negative for dizziness, weakness and headaches.  Hematological:  Negative for adenopathy.  Psychiatric/Behavioral:  The patient is not nervous/anxious.      Objective:    Physical Exam Vitals and nursing note reviewed.  Constitutional:      Appearance: Normal appearance. She is well-developed.  HENT:     Head: Normocephalic and atraumatic.     Nose: Nose normal.     Mouth/Throat:     Mouth: Mucous membranes are moist.  Eyes:     Extraocular Movements: Extraocular movements intact.     Conjunctiva/sclera: Conjunctivae normal.     Pupils: Pupils are equal, round, and  reactive to light.  Cardiovascular:     Rate and Rhythm: Normal rate and regular rhythm.     Pulses: Normal pulses.     Heart sounds: Normal heart sounds.  Pulmonary:     Effort: Pulmonary effort is normal.     Breath sounds: Normal breath sounds.  Abdominal:     Palpations: Abdomen is soft.  Musculoskeletal:        General: Normal range of motion.     Cervical back: Normal range of motion and neck supple.  Lymphadenopathy:     Cervical: No cervical adenopathy.  Skin:    General: Skin is warm and dry.     Capillary Refill: Capillary refill takes less than 2 seconds.  Neurological:     General: No focal deficit present.     Mental Status: She is alert and oriented to person, place, and time.  Psychiatric:        Mood and Affect: Mood normal.        Behavior: Behavior normal.        Thought Content: Thought content normal.        Judgment: Judgment normal.    Today's Vitals   11/25/20 1610  BP: 102/68  Pulse: 77  Temp: 97.9 F (36.6 C)  SpO2: 100%  Weight: 127 lb 14.4 oz (58 kg)  Height: 5' 4"  (1.626 m)   Body mass index is 21.95 kg/m.   Wt Readings from Last 3 Encounters:  11/25/20 127 lb 14.4 oz (58 kg)  11/17/20 125 lb (56.7 kg)  11/05/20 123 lb 3.2 oz (55.9 kg)     Health Maintenance Due  Topic Date Due   Pneumococcal Vaccine 63-72 Years old (1 - PCV) Never done   Hepatitis C Screening  Never done   COVID-19 Vaccine (3 - Pfizer risk series) 06/29/2019   TETANUS/TDAP  08/22/2020   INFLUENZA VACCINE  09/07/2020    There are no preventive care reminders to display for this patient.  Lab Results  Component Value Date   TSH 1.510 05/07/2020   Lab Results  Component Value Date   WBC 5.2 07/31/2020   HGB 14.0 07/31/2020   HCT 41.9 07/31/2020   MCV 90 07/31/2020   PLT 250 07/31/2020   Lab Results  Component Value Date   NA 138 07/31/2020   K 4.5 07/31/2020   CO2 25 07/31/2020   GLUCOSE 71 07/31/2020   BUN 16 07/31/2020   CREATININE 0.79  07/31/2020   BILITOT 0.4 05/07/2020   ALKPHOS 54 05/07/2020   AST 31 05/07/2020   ALT 24 05/07/2020   PROT 7.5 05/07/2020   ALBUMIN 4.6 05/07/2020   CALCIUM 9.5 07/31/2020   ANIONGAP 5 11/25/2019   EGFR 99 07/31/2020   GFR 81.83 12/25/2017   Lab Results  Component Value Date   CHOL 213 (H) 05/07/2020   Lab Results  Component Value Date   HDL 132 05/07/2020   Lab Results  Component Value Date   LDLCALC 72 05/07/2020   Lab Results  Component Value Date   TRIG 46 05/07/2020   Lab Results  Component Value Date   CHOLHDL 1.6 05/07/2020   Lab Results  Component Value Date   HGBA1C 5.0 07/31/2020      Assessment & Plan:  1. Left lower quadrant abdominal tenderness without rebound tenderness Reviewed CT abdomen pelvis.  Several small, benign-appearing lesions on the liver.  No acute abnormalities which would explain patient's symptoms.  Recent colonoscopy and endoscopy were negative.  She will continue to follow-up with GI.  2. Cyst of left ovary CT scan did not reveal 2.8 cm simple left ovarian cyst.  GYN has suggested trial of estrogen, patient reluctant.  History of it.  We will check a CA125.  Recommend she follow-up with GYN as scheduled.  3. 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  Check CA125 for further evaluation.  Recommend follow-up with GYN as scheduled. 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    Ronnell Freshwater, NP  This note was dictated using Systems analyst. Rapid proofreading was performed to expedite the delivery of the information. Despite proofreading, phonetic errors will occur which are  common with this voice recognition software. Please take this into consideration. If there are any concerns, please contact our office.

## 2020-11-27 ENCOUNTER — Encounter: Payer: Self-pay | Admitting: Nurse Practitioner

## 2020-11-27 LAB — CA 125: Cancer Antigen (CA) 125: 13.3 U/mL (ref 0.0–38.1)

## 2020-11-27 NOTE — Progress Notes (Signed)
Negative Ca 125. MyChart message sent to patient

## 2020-11-29 DIAGNOSIS — D3912 Neoplasm of uncertain behavior of left ovary: Secondary | ICD-10-CM | POA: Insufficient documentation

## 2020-11-29 DIAGNOSIS — N83202 Unspecified ovarian cyst, left side: Secondary | ICD-10-CM | POA: Insufficient documentation

## 2020-11-30 ENCOUNTER — Other Ambulatory Visit: Payer: Self-pay

## 2020-12-01 NOTE — Telephone Encounter (Signed)
Glad to see that the 5-HIAA came back as normal.  The porphyrin testing that we sent off is still pending, so we will look for those results as well.  Not much different to order right now from an imaging or lab standpoint while we await results and follow-up appointment with GYN.  Given our previous conversation about her symptoms and lack of findings so far, can send referral to Cardiology for work-up of flushing, dizziness, with consideration of POTS syndrome.

## 2020-12-01 NOTE — Progress Notes (Signed)
Referring:  [name], MD [address], [place],  [place] [address]  PCP: Lorrene Reid, PA-C  Neurology was asked to evaluate Marissa Salazar, a 40 year old female for a chief complaint of headaches.  Our recommendations of care will be communicated by shared medical record.    CC:  headaches, dizziness  HPI:  Medical co-morbidities: none  The patient presents for evaluation of headaches, dizziness, and stomach pain which began 2-3 months ago. She will develop a 5/10 frontal headache described as pins and needles or burning, which will be accompanied by a sensation of imbalance, palpitations, stomach pain, flushing of both sides of her face, and dilation of bilateral pupils. She does not have associated photophobia or phonophobia. This will last for 1-2 hours and then subside. Occurs three times a day every day, always at 8 am, then 12 pm, then around 6 pm. Denies orthostatic intolerance or palpitations on standing. Episodes will occur whether she is sitting or standing, at work or at home.  She had a GI and endocrinology workup which were unremarkable. Was found to have an ovarian cyst by GYN, CA 125 was wnl. She may see Cardiology for POTS evaluation.  She had a normal CTH on 07/2020. Has an MRI brain scheduled for 12/05/20  Had COVID last year. Also had a hysterectomy around that time.  Headache History: Onset: 2-3 months ago Triggers: no Most common time of day for headache to begin: 8 AM, 12-1 pm, 6 pm Aura: no Location: vertex, frontal Quality/Description: burning, flushing, pins and needles, throbbing Severity: 5-7/10 Associated Symptoms:  Photophobia: no  Phonophobia: no  Nausea: no Other symptoms: dilated pupils, facial swelling, palpitations Worse with activity?: Duration of headaches: 2 hours  Pregnancy planning/birth control: s/p hysterectomy  Headache days per month: 30 Headache free days per month: 0  Current  Treatment: Abortive none  Preventative none  Prior Therapies                                 Motrin Tylenol   Headache Risk Factors: Headache risk factors and/or co-morbidities (-) Neck Pain (-) History of Motor Vehicle Accident (+) Sleep Disorder - insomnia (-) History of Traumatic Brain Injury and/or Concussion (+) History of Syncope  LABS: TSH, vitamin D, porphyrins, 5-HIAA, tryptase wnl  IMAGING:  CTH 6/22: unremarkable  Current Outpatient Medications on File Prior to Visit  Medication Sig Dispense Refill   cholecalciferol (VITAMIN D3) 25 MCG (1000 UNIT) tablet Take 1,000 Units by mouth daily.     Multiple Vitamin (MULTIVITAMIN) tablet Take 1 tablet by mouth daily.     zolpidem (AMBIEN) 10 MG tablet TAKE 1 TABLET BY MOUTH AT BEDTIME AS NEEDED FOR INSOMNIA 30 tablet 3   No current facility-administered medications on file prior to visit.     Allergies: Allergies  Allergen Reactions   Ivp Dye [Iodinated Diagnostic Agents] Hives    Family History: Migraine or other headaches in the family:  no Aneurysms in a first degree relative:  no Brain tumors in the family:  no Other neurological illness in the family:   no  Past Medical History: Past Medical History:  Diagnosis Date   Abnormal Pap smear    in past; normal colpo   Allergic rhinitis    Allergy    Anemia    Hx fibroids   Blood transfusion without reported diagnosis 05/2019   Dysplastic nevus 10/05/2016  R infra axillary, mild atypia   Family history of breast cancer    Family history of colon cancer    Family history of pancreatic cancer    Fibroid    Headache    Iron deficiency anemia due to chronic blood loss    Superficial vein thrombosis 2019   Left leg   Uterine fibroid 05/09/2019   sonohysterogram showing 7.8 x 7.4 cm fibroid with 4.6 cm in endometrial cavity - Dr. Lowe    Past Surgical History Past Surgical History:  Procedure Laterality Date   BREAST BIOPSY Left 06-12-14    fibroadenoma   BUNIONECTOMY Right 2013   COLPOSCOPY  2009   ESOPHAGOGASTRODUODENOSCOPY N/A 07/17/2015   Procedure: ESOPHAGOGASTRODUODENOSCOPY (EGD);  Surgeon: Robert T Elliott, MD;  Location: [facility] ENDOSCOPY;  Service: Endoscopy;  Laterality: N/A;    Social History: Social History   Tobacco Use   Smoking status: Never   Smokeless tobacco: Never  Vaping Use   Vaping Use: Never used  Substance Use Topics   Alcohol use: Yes    Alcohol/week: 0.0 standard drinks    Comment: Rare   Drug use: No    ROS: Negative for fevers, chills. Positive for headaches, flushing, abdominal pain, palpitations. All other systems reviewed and negative unless stated otherwise in HPI.   Physical Exam:   Vital Signs: BP 97/67   Pulse 77   Ht 5' 4" (1.626 m)   Wt 125 lb (56.7 kg)   LMP  (LMP Unknown)   BMI 21.46 kg/m  GENERAL: well appearing,in no acute distress,alert SKIN:  Color, texture, turgor normal. No rashes or lesions HEAD:  Normocephalic/atraumatic. CV:  RRR RESP: Normal respiratory effort MSK: no tenderness to palpation over occiput, neck, or shoulders  NEUROLOGICAL: Mental Status: Alert, oriented to person, place and time,Follows commands Cranial Nerves: PERRL,visual fields intact to confrontation,extraocular movements intact,facial sensation intact,no facial droop or ptosis,hearing intact to finger rub bilaterally,no dysarthria,palate elevate symmetrically,tongue protrudes midline,shoulder shrug intact and symmetric Motor: muscle strength 5/5 both upper and lower extremities,no drift, normal tone Reflexes: 2+ throughout Sensation: intact to light touch all 4 extremities Coordination: Finger-to- nose-finger intact bilaterally,Heel-to-shin intact bilaterally Gait: normal-based   IMPRESSION: 40 year old female who presents for evaluation of a episodes of headaches, imbalance, flushing, and palpitations. Her neurological exam today is normal. Headaches do not have typical migrainous  features. There is no clear sign of a neurological disorder causing her symptoms. Could consider POTS, though she does not endorse typical orthostatic symptoms. She has already had extensive workup, will add autoimmune/inflammatory labs. She has an MRI brain scheduled for later this week. In the meantime will try gabapentin to help reduce the pins and needles sensation in her head as it has been very bothersome to her.  PLAN: -Start gabapentin 300 mg TID -ANA, ENA, ESR, CRP -MRI brain scheduled for 12/05/20 -next steps: consider propranolol, TCA, SNRI for headaches  I spent a total of 45 minutes chart reviewing and counseling the patient. Headache education was done. Discussed treatment options including preventive and acute medication. Discussed medication side effects, adverse reactions and drug interactions. Written educational materials and patient instructions outlining all of the above were given.  Follow-up: 3 months   Jennifer Chima, MD 12/02/2020   10:32 AM   

## 2020-12-02 ENCOUNTER — Other Ambulatory Visit: Payer: Self-pay

## 2020-12-02 ENCOUNTER — Ambulatory Visit: Payer: No Typology Code available for payment source | Admitting: Psychiatry

## 2020-12-02 ENCOUNTER — Encounter: Payer: Self-pay | Admitting: Psychiatry

## 2020-12-02 ENCOUNTER — Encounter: Payer: Self-pay | Admitting: Hematology and Oncology

## 2020-12-02 VITALS — BP 97/67 | HR 77 | Ht 64.0 in | Wt 125.0 lb

## 2020-12-02 DIAGNOSIS — R232 Flushing: Secondary | ICD-10-CM

## 2020-12-02 DIAGNOSIS — R519 Headache, unspecified: Secondary | ICD-10-CM

## 2020-12-02 DIAGNOSIS — R42 Dizziness and giddiness: Secondary | ICD-10-CM

## 2020-12-02 MED ORDER — GABAPENTIN 300 MG PO CAPS
300.0000 mg | ORAL_CAPSULE | Freq: Three times a day (TID) | ORAL | 3 refills | Status: DC
  Filled 2020-12-02: qty 90, 30d supply, fill #0
  Filled 2021-01-12: qty 90, 30d supply, fill #1

## 2020-12-02 NOTE — Patient Instructions (Addendum)
Blood work to look for autoimmune condition Start gabapentin. Take 300 mg at bedtime for one week, then increase to 300 mg twice a day (or 600 mg at bedtime) for one week, then increase to 300 mg TID (or 900 mg at bedtime)

## 2020-12-05 ENCOUNTER — Ambulatory Visit
Admission: RE | Admit: 2020-12-05 | Discharge: 2020-12-05 | Disposition: A | Discharge status: Home / Self Care | Payer: No Typology Code available for payment source | Source: Ambulatory Visit | Attending: Nurse Practitioner | Admitting: Nurse Practitioner

## 2020-12-05 DIAGNOSIS — R519 Headache, unspecified: Secondary | ICD-10-CM

## 2020-12-05 DIAGNOSIS — R42 Dizziness and giddiness: Secondary | ICD-10-CM

## 2020-12-05 LAB — ANA+ENA+DNA/DS+SCL 70+SJOSSA/B
ANA Titer 1: POSITIVE — AB
ENA RNP Ab: <0.2 AI (ref 0.0–0.9)
ENA SM Ab Ser-aCnc: <0.2 AI (ref 0.0–0.9)
ENA SSA (RO) Ab: <0.2 AI (ref 0.0–0.9)
ENA SSB (LA) Ab: <0.2 AI (ref 0.0–0.9)
Scleroderma (Scl-70) (ENA) Antibody, IgG: <0.2 AI (ref 0.0–0.9)
dsDNA Ab: 1 IU/mL (ref 0–9)

## 2020-12-05 LAB — FANA STAINING PATTERNS: Speckled Pattern: 1:160 {titer} — ABNORMAL HIGH

## 2020-12-05 LAB — SEDIMENTATION RATE: Sed Rate: 2 mm/hr (ref 0–32)

## 2020-12-05 LAB — C-REACTIVE PROTEIN: CRP: <1 mg/L (ref 0–10)

## 2020-12-05 MED ORDER — GADOBENATE DIMEGLUMINE 529 MG/ML IV SOLN
11.0000 mL | Freq: Once | INTRAVENOUS | Status: AC | PRN
  Administered 2020-12-05: 11 mL via INTRAVENOUS

## 2020-12-07 ENCOUNTER — Ambulatory Visit: Payer: No Typology Code available for payment source | Admitting: Psychiatry

## 2020-12-07 ENCOUNTER — Other Ambulatory Visit: Payer: Self-pay | Admitting: Psychiatry

## 2020-12-07 DIAGNOSIS — R768 Other specified abnormal immunological findings in serum: Secondary | ICD-10-CM

## 2020-12-09 ENCOUNTER — Encounter: Payer: Self-pay | Admitting: Nurse Practitioner

## 2020-12-09 NOTE — Progress Notes (Signed)
MRI of the brain is normal. MyChart communication sent to patient .

## 2020-12-22 ENCOUNTER — Ambulatory Visit: Payer: No Typology Code available for payment source | Admitting: Dermatology

## 2020-12-25 ENCOUNTER — Ambulatory Visit: Payer: No Typology Code available for payment source | Admitting: Cardiology

## 2020-12-29 ENCOUNTER — Other Ambulatory Visit: Payer: Self-pay

## 2020-12-29 MED ORDER — LO LOESTRIN FE 1 MG-10 MCG / 10 MCG PO TABS
ORAL_TABLET | ORAL | 12 refills | Status: DC
  Filled 2020-12-29: qty 28, 28d supply, fill #0

## 2021-01-02 ENCOUNTER — Encounter: Payer: Self-pay | Admitting: Nurse Practitioner

## 2021-01-05 ENCOUNTER — Other Ambulatory Visit: Payer: Self-pay

## 2021-01-05 ENCOUNTER — Encounter: Payer: Self-pay | Admitting: Psychiatry

## 2021-01-05 ENCOUNTER — Other Ambulatory Visit: Payer: Self-pay | Admitting: Psychiatry

## 2021-01-05 DIAGNOSIS — R768 Other specified abnormal immunological findings in serum: Secondary | ICD-10-CM

## 2021-01-07 ENCOUNTER — Other Ambulatory Visit: Payer: Self-pay

## 2021-01-07 MED ORDER — IBUPROFEN 800 MG PO TABS
ORAL_TABLET | ORAL | 0 refills | Status: DC
  Filled 2021-01-07: qty 30, 10d supply, fill #0

## 2021-01-11 ENCOUNTER — Ambulatory Visit (INDEPENDENT_AMBULATORY_CARE_PROVIDER_SITE_OTHER): Payer: No Typology Code available for payment source | Admitting: Obstetrics and Gynecology

## 2021-01-11 ENCOUNTER — Encounter: Payer: Self-pay | Admitting: Obstetrics and Gynecology

## 2021-01-11 ENCOUNTER — Other Ambulatory Visit: Payer: Self-pay

## 2021-01-11 VITALS — BP 100/58 | HR 70 | Ht 64.0 in | Wt 126.0 lb

## 2021-01-11 DIAGNOSIS — N83202 Unspecified ovarian cyst, left side: Secondary | ICD-10-CM

## 2021-01-11 DIAGNOSIS — R1032 Left lower quadrant pain: Secondary | ICD-10-CM

## 2021-01-11 DIAGNOSIS — R5381 Other malaise: Secondary | ICD-10-CM

## 2021-01-11 NOTE — Progress Notes (Signed)
GYNECOLOGY  VISIT   HPI: 40 y.o.   Divorced  Caucasian  female   G2P2002 with No LMP recorded (lmp unknown). Patient has had a hysterectomy.   here for LLQ discomfort which is worsening--see CT report.  Having pain for 6 months and progressively worse.  Pain is stabbing, radiating, and vibrating. Also having headaches, dizziness, nausea, urinary frequency, pins and needles type feeling.   States she has seen multiple providers for her symptoms.   Hx fibroids.  Had abdominal hysterectomy in September, 2021 With Dr. [name] Her ovaries remain.   CT scan done 11/10/20 showed a 2.8 cm simple left ovarian cyst.  She had a pelvic US in follow up through Dr. [name], and the cyst measured slightly larger by measurements.  She tried LoEstrin Fe one week ago from her gynecologist, and the pain became worse, so this was stopped.   Brown 2.4 12/28/20.  Estradiol 160 10/22/20.   Had Covid last August/September 2021.   Saw endocrinology and they ruled out carcinoid, thyroid disease.   GI did an endoscopy and colonoscopy, which were normal.  Bentyl did not work.   Saw neurology and had a normal MRI of the brain.  Tried Gabapentin and this did not help.   She had a low positive ANA.  She has an appointment on 01/26/21 to see Rheumatology.  GYNECOLOGIC HISTORY: No LMP recorded (lmp unknown). Patient has had a hysterectomy. 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    Elevated cortisol level 11/05/2020   Facial swelling 11/05/2020   Fatigue 11/05/2020   Left lower quadrant abdominal tenderness without rebound tenderness 11/05/2020   Gastritis due to Helicobacter species 92/92/4462   Lymphadenopathy 08/10/2020   Polyuria 08/10/2020   Adjustment disorder with depressed mood 05/07/2020   Bunion 86/38/1771   Complication of pregnancy, antepartum 05/07/2020   Dermatitis 05/07/2020   Gastroenteritis 05/07/2020   Supervision of normal first pregnancy 05/07/2020   Raynaud's phenomenon 05/07/2020   Symptomatic anemia 10/05/2019   Lab test positive for detection of COVID-19 virus 10/05/2019   Uterine leiomyoma 16/57/9038   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   Family history of breast cancer    Family history of colon cancer    Family history of pancreatic cancer    Fibrositis 11/07/2018   Iron deficiency anemia 03/29/2018   Elevated pulse rate 03/29/2018   Menorrhagia 03/29/2018   History of uterine fibroid 03/29/2018   GERD (gastroesophageal reflux disease) 33/38/3291   History of Helicobacter pylori infection 05/23/2017   Nevus 09/29/2016   Family history of breast cancer in female 05/28/2014    Past Medical History:  Diagnosis Date   Abnormal Pap smear  in past; normal colpo   Allergic rhinitis    Allergy    Anemia    Hx fibroids   Blood transfusion without reported diagnosis 05/2019   Dysplastic nevus 10/05/2016   R infra axillary, mild atypia   Family history of breast cancer    Family history of colon cancer    Family history of pancreatic cancer    Fibroid    Headache    Iron deficiency anemia due to chronic blood loss    Superficial vein thrombosis 2019   Left leg   Uterine fibroid 05/09/2019   sonohysterogram showing 7.8 x 7.4 cm fibroid with 4.6 cm in endometrial cavity - Dr. [name]    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   BUNIONECTOMY Right 2013    COLPOSCOPY  2009   ESOPHAGOGASTRODUODENOSCOPY N/A 07/17/2015   Procedure: ESOPHAGOGASTRODUODENOSCOPY (EGD);  Surgeon: Manya Silvas, MD;  Location: [facility] ENDOSCOPY;  Service: Endoscopy;  Laterality: N/A;    Current Outpatient Medications  Medication Sig Dispense Refill   cholecalciferol (VITAMIN D3) 25 MCG (1000 UNIT) tablet Take 1,000 Units by mouth daily.     ibuprofen (ADVIL) 800 MG tablet Take 1 tablet every 6-8 hours by oral route as needed for 10 days. 30 tablet 0   Multiple Vitamin (MULTIVITAMIN) tablet Take 1 tablet by mouth daily.     zolpidem (AMBIEN) 10 MG tablet TAKE 1 TABLET BY MOUTH AT BEDTIME AS NEEDED FOR INSOMNIA 30 tablet 3   gabapentin (NEURONTIN) 300 MG capsule Take 1 capsule (300 mg total) by mouth 3 (three) times daily. (Patient not taking: Reported on 01/11/2021) 90 capsule 3   No current facility-administered medications for this visit.     ALLERGIES: Ivp dye [iodinated diagnostic agents]  Family History  Problem Relation Age of Onset   Colon cancer Maternal Grandmother 53   Breast cancer Maternal Grandmother 54   Hypertension Maternal Grandmother    Breast cancer Maternal Aunt 60       BRCA negative   Pancreatic cancer Maternal Aunt 44   Heart attack Maternal Uncle    Diabetes Paternal Uncle    Breast cancer Other        GP   Lung cancer Other        GP   Esophageal cancer Neg Hx    Liver cancer Neg Hx    Stomach cancer Neg Hx     Social History   Socioeconomic History   Marital status: Divorced    Spouse name: Not on file   Number of children: 2   Years of education: Not on file   Highest education level: Associate degree: occupational, Hotel manager, or vocational program  Occupational History   Occupation: Ship broker    Comment: CMA  Tobacco Use   Smoking status: Never   Smokeless tobacco: Never  Vaping Use   Vaping Use: Never used  Substance and Sexual Activity   Alcohol use: Yes    Alcohol/week: 0.0 standard drinks    Comment: Rare   Drug  use: No   Sexual activity: Not Currently    Birth control/protection: None  Other Topics Concern   Not on file  Social History Narrative   Lives children   Regular exercise   Caffeine- coffee 2 c but stopped   Social Determinants of Health   Financial Resource Strain: Not on file  Food Insecurity: Not on file  Transportation Needs: Not on file  Physical Activity: Not  on file  Stress: Not on file  Social Connections: Not on file  Intimate Partner Violence: Not on file    Review of Systems  Genitourinary:  Positive for pelvic pain (LLQ).  All other systems reviewed and are negative.  PHYSICAL EXAMINATION:    BP (!) 100/58   Pulse 70   Ht _0  (1.626 m)   Wt 126 lb (57.2 kg)   LMP  (LMP Unknown)   BMI 21.63 kg/m     General appearance: alert, cooperative and appears stated age Abdomen: soft, non-tender, no masses,  no organomegaly No abnormal inguinal nodes palpated   Pelvic: External genitalia:  no lesions              Urethra:  normal appearing urethra with no masses, tenderness or lesions              Bartholins and Skenes: normal                 Vagina: normal appearing vagina with normal color and discharge, no lesions              Cervix: absent                Bimanual Exam:  Uterus:  absent              Adnexa: no mass, fullness, tenderness              Rectal exam: Yes.  .  Confirms.              Anus:  normal sphincter tone, no lesions  Chaperone was present for exam:  Estill Bamberg, CMA.  ASSESSMENT  LLQ pain. Chronic.  Status post hysterectomy.  Simple left ovarian cyst.  Hx superficial thrombus of left leg.  ATM gene mutation:  monoallelic. FH of cancers, including breast cancer.  Malaise.  Positive ANA.  PLAN  Care reviewed. We discussed potential adhesive disease as the source of her pain.  Pain control with Lidocaine patches, heat, Tylenol, and Ibuprofen.  She will continue off her COCs due to hx of superficial thrombus of vein in leg.  We did  discuss Depo Provera as a alternative to the COCs, and patient is most comfortable with avoiding hormonal treatment if possible due to family history of breast cancer.  Keep Rheumatology consult appointment.  Consider laparoscopic evaluation if pain persists after seeing Rheumatology.   An After Visit Summary was printed and given to the patient.  32 min  total time was spent for this patient encounter, including preparation, face-to-face counseling with the patient, coordination of care, and documentation of the encounter.

## 2021-01-12 ENCOUNTER — Other Ambulatory Visit: Payer: Self-pay

## 2021-01-15 ENCOUNTER — Other Ambulatory Visit: Payer: Self-pay

## 2021-01-15 MED ORDER — LIDOCAINE 5 % EX PTCH
MEDICATED_PATCH | CUTANEOUS | 0 refills | Status: DC
  Filled 2021-01-15: qty 8, 8d supply, fill #0

## 2021-01-18 ENCOUNTER — Other Ambulatory Visit: Payer: Self-pay

## 2021-01-18 ENCOUNTER — Telehealth: Payer: Self-pay | Admitting: Physician Assistant

## 2021-01-18 ENCOUNTER — Other Ambulatory Visit: Payer: Self-pay | Admitting: Nurse Practitioner

## 2021-01-18 DIAGNOSIS — M064 Inflammatory polyarthropathy: Secondary | ICD-10-CM

## 2021-01-18 MED ORDER — METHYLPREDNISOLONE 4 MG PO TBPK
ORAL_TABLET | ORAL | 0 refills | Status: DC
  Filled 2021-01-18: qty 21, 6d supply, fill #0

## 2021-01-18 NOTE — Telephone Encounter (Signed)
Rx was sent to the pharmacy  

## 2021-01-18 NOTE — Telephone Encounter (Signed)
Patient called and said she had a conversation with Heather on [place] and is requesting a prescription for prednisone. Please advise. [phone]

## 2021-01-19 ENCOUNTER — Other Ambulatory Visit: Payer: Self-pay

## 2021-01-19 MED ORDER — IBUPROFEN 800 MG PO TABS
ORAL_TABLET | ORAL | 0 refills | Status: DC
  Filled 2021-01-19: qty 30, 10d supply, fill #0

## 2021-01-20 ENCOUNTER — Other Ambulatory Visit: Payer: Self-pay

## 2021-01-20 MED ORDER — NORETHINDRONE 0.35 MG PO TABS
ORAL_TABLET | ORAL | 3 refills | Status: DC
  Filled 2021-01-20: qty 84, 84d supply, fill #0

## 2021-01-20 MED ORDER — CYCLOBENZAPRINE HCL 10 MG PO TABS
ORAL_TABLET | ORAL | 2 refills | Status: DC
  Filled 2021-01-20: qty 60, 20d supply, fill #0
  Filled 2021-03-01: qty 60, 20d supply, fill #1

## 2021-01-25 NOTE — Progress Notes (Signed)
Office Visit Note  Patient: Marissa Salazar             Date of Birth: 11-28-1980           MRN: [id number]             PCP: Lorrene Reid, PA-C Referring: Genia Harold, MD Visit Date: 01/26/2021   Subjective:  New Patient (Initial Visit) (Abnormal labs)   History of Present Illness: Marissa Salazar is a 40 y.o. female here for evaluation of positive ANA associated with ongoing headaches and skin flushing and tingling sensations. Symptoms with insidious onset but at least present for past 3 months. She has episodes lasting up to an hour or so at a time with severe pain and burning discomfort and skin redness affecting her face, neck, and upper chest. She notices significant central facial swelling. These are associated with headache and lightheadedness sensation. Sometimes she feels numbness and tingling to extremities during the symptoms. In between pretty much completely resolves. Evaluations so far including Ct and MRI of the head without structural cause of headache and dizziness symptoms. She saw endocrinology for high cortisol and flushing with no specific concerning findings at initial visit. Abdominal CT imaging showing cysts at left ovary and liver without any atypical or high risk features. Lab workup has been unremarkable besides positive ANA with negative reflex ENA panel. During the past 2 years she had COVID infections without severe pulmonary disease but required hysterectomy in 10/2019 due to refractory fibroid bleeding associated with the infection. So far no significant benefit with anything. She tried taking gabapentin with no significant improvement. She has usually been very physically active as a runner but very decreased since to the ongoing symptoms. She reports having some weight loss despite eating well and lack of activity.  Labs reviewed 11/2020 ANA 1:160 speckled dsDNA, SM, RNP, Scl-70, SSA, SSB neg ESR 2 CRP <1 CA 125 neg TSH, T3, T4 wnl  Activities of  Daily Living:  Patient reports morning stiffness for 0  none .   Patient Reports nocturnal pain.  Difficulty dressing/grooming: Denies Difficulty climbing stairs: Denies Difficulty getting out of chair: Denies Difficulty using hands for taps, buttons, cutlery, and/or writing: Denies  Review of Systems  Constitutional:  Positive for fatigue.  HENT:  Positive for mouth dryness.   Eyes:  Positive for dryness.  Respiratory:  Positive for shortness of breath.   Cardiovascular:  Positive for swelling in legs/feet.  Gastrointestinal:  Negative for constipation.  Endocrine: Positive for cold intolerance, heat intolerance, excessive thirst and increased urination.  Genitourinary:  Negative for difficulty urinating.  Musculoskeletal:  Positive for muscle weakness.  Skin:  Positive for rash.  Allergic/Immunologic: Negative for susceptible to infections.  Neurological:  Positive for numbness and weakness.  Hematological:  Positive for bruising/bleeding tendency.  Psychiatric/Behavioral:  Positive for sleep disturbance.    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   Elevated cortisol level 11/05/2020   Facial swelling 11/05/2020   Fatigue 11/05/2020   Left lower quadrant abdominal tenderness without rebound tenderness 11/05/2020   Gastritis due to Helicobacter species 41/66/0630   Lymphadenopathy 08/10/2020   Polyuria 08/10/2020   Bunion 05/07/2020   Dermatitis 05/07/2020   Gastroenteritis 05/07/2020   Raynaud's phenomenon 05/07/2020   Symptomatic anemia 10/05/2019   Uterine leiomyoma 16/02/930   Monoallelic  mutation of ATM gene 11/12/2018   Family history of breast cancer    Family history of colon cancer    Family history of pancreatic cancer    Fibrositis 11/07/2018   Iron deficiency anemia 03/29/2018   Elevated  pulse rate 03/29/2018   History of uterine fibroid 03/29/2018   GERD (gastroesophageal reflux disease) 16/11/9602   History of Helicobacter pylori infection 05/23/2017   Nevus 09/29/2016   Family history of breast cancer in female 05/28/2014    Past Medical History:  Diagnosis Date   Abnormal Pap smear    in past; normal colpo   Allergic rhinitis    Allergy    Anemia    Hx fibroids   Blood transfusion without reported diagnosis 05/2019   Dysplastic nevus 10/05/2016   R infra axillary, mild atypia   Family history of breast cancer    Family history of colon cancer    Family history of pancreatic cancer    Fibroid    Headache    Iron deficiency anemia due to chronic blood loss    Lab test positive for detection of COVID-19 virus 10/05/2019   Menorrhagia 03/29/2018   Superficial vein thrombosis 2019   Left leg   Uterine fibroid 05/09/2019   sonohysterogram showing 7.8 x 7.4 cm fibroid with 4.6 cm in endometrial cavity - Dr. [name]    Family History  Problem Relation Age of Onset   Multiple myeloma Father    Breast cancer Maternal Aunt 60       BRCA negative   Pancreatic cancer Maternal Aunt 44   Heart attack Maternal Uncle    Diabetes Paternal Uncle    Colon cancer Maternal Grandmother 73   Breast cancer Maternal Grandmother 54   Hypertension Maternal Grandmother    Breast cancer Other        GP   Lung cancer Other        GP   Esophageal cancer Neg Hx    Liver cancer Neg Hx    Stomach cancer Neg Hx    Past Surgical History:  Procedure Laterality Date   ABDOMINAL HYSTERECTOMY     BREAST BIOPSY Left 06/12/2014   fibroadenoma   BUNIONECTOMY Right 2013   COLPOSCOPY  2009   ESOPHAGOGASTRODUODENOSCOPY N/A 07/17/2015   Procedure: ESOPHAGOGASTRODUODENOSCOPY (EGD);  Surgeon: Manya Silvas, MD;  Location[facility] ENDOSCOPY;  Service: Endoscopy;  Laterality: N/A;   Social History   Social History Narrative   Lives children   Regular exercise   Caffeine- coffee 2 c but  stopped   Immunization History  Administered Date(s) Administered   Hepatitis A 02/13/2002, 05/12/2002   Hpv-Unspecified 01/18/2006   IPV 02/08/2002   Influenza Whole 11/28/2008   Influenza,inj,Quad PF,6+ Mos 11/12/2019   Influenza-Unspecified 11/21/2013, 10/13/2016   Meningococcal Conjugate 02/08/2002   PFIZER(Purple Top)SARS-COV-2 Vaccination 05/11/2019, 06/01/2019   PPD Test 11/12/2019   Td 10/23/2001, 02/08/2002   Tdap 08/23/2010     Objective: Vital Signs: BP (!) 88/60 (BP Location: Right Arm, Patient Position: Sitting, Cuff Size: Normal)    Pulse 84    Resp 14    Ht _0  (1.626 m)    Wt 123 lb 9.6 oz (56.1 kg)    LMP  (LMP Unknown)    BMI 21.22 kg/m    Physical Exam HENT:     Right Ear: External ear normal.     Left Ear: External ear normal.     Mouth/Throat:     Mouth: Mucous membranes are moist.  Pharynx: Oropharynx is clear.  Eyes:     Comments: Mild conjunctival injection on right eye No periorbital swelling  Cardiovascular:     Rate and Rhythm: Normal rate and regular rhythm.  Pulmonary:     Effort: Pulmonary effort is normal.     Breath sounds: Normal breath sounds.  Musculoskeletal:     Right lower leg: No edema.     Left lower leg: No edema.  Skin:    General: Skin is warm and dry.     Comments: Few moles on face, no rashes or telangiectasias Normal appearing nailfold capillaroscopy No nail or digital pitting  Neurological:     General: No focal deficit present.     Mental Status: She is alert.     Deep Tendon Reflexes: Reflexes normal.  Psychiatric:        Mood and Affect: Mood normal.     Musculoskeletal Exam:  Neck full ROM no tenderness Shoulders full ROM no tenderness or swelling Elbows full ROM no tenderness or swelling Wrists full ROM no tenderness or swelling Fingers full ROM no tenderness or swelling Knees full ROM no tenderness or swelling Ankles full ROM no tenderness or swelling  Investigation: No additional  findings.  Imaging: No results found.  Recent Labs: Lab Results  Component Value Date   WBC 5.2 07/31/2020   HGB 14.0 07/31/2020   PLT 250 07/31/2020   NA 138 07/31/2020   K 4.5 07/31/2020   CL 100 07/31/2020   CO2 25 07/31/2020   GLUCOSE 71 07/31/2020   BUN 16 07/31/2020   CREATININE 0.79 07/31/2020   BILITOT 0.4 05/07/2020   ALKPHOS 54 05/07/2020   AST 31 05/07/2020   ALT 24 05/07/2020   PROT 7.5 05/07/2020   ALBUMIN 4.6 05/07/2020   CALCIUM 9.5 07/31/2020   GFRAA 118 11/12/2019    Speciality Comments: No specialty comments available.  Procedures:  No procedures performed Allergies: Ivp dye [iodinated diagnostic agents]   Assessment / Plan:     Visit Diagnoses: Positive ANA (antinuclear antibody)  Positive ANA with negative initial ENA testing she has dry eyes and mouth and raynaud's but nonspecific for clinical criteria. Episodic symptoms an hour at a time is atypical. Will send for [facility] CTD labs more extensive serology testing with possible early or new disease.  Raynaud's phenomenon without gangrene  No ischemic changes no abnormal nailfold capillary appearance on exam today. No new treatment besides cold protection.  Lymphadenopathy Facial swelling  Other symptoms with lymph node or facial swelling changes but nothing present on exam today so cannot characterize much further. No residual adenopathy or pigmentation changes.  Orders: No orders of the defined types were placed in this encounter.  No orders of the defined types were placed in this encounter.    Follow-Up Instructions: Return for New pt [facility] labs f/u.   Collier Salina, MD  Note - This record has been created using [employer].  Chart creation errors have been sought, but may not always  have been located. Such creation errors do not reflect on  the standard of medical care.

## 2021-01-26 ENCOUNTER — Other Ambulatory Visit: Payer: Self-pay

## 2021-01-26 ENCOUNTER — Ambulatory Visit (INDEPENDENT_AMBULATORY_CARE_PROVIDER_SITE_OTHER): Payer: No Typology Code available for payment source | Admitting: Internal Medicine

## 2021-01-26 ENCOUNTER — Encounter: Payer: Self-pay | Admitting: Internal Medicine

## 2021-01-26 VITALS — BP 88/60 | HR 84 | Resp 14 | Ht 64.0 in | Wt 123.6 lb

## 2021-01-26 DIAGNOSIS — R220 Localized swelling, mass and lump, head: Secondary | ICD-10-CM | POA: Diagnosis not present

## 2021-01-26 DIAGNOSIS — R591 Generalized enlarged lymph nodes: Secondary | ICD-10-CM | POA: Diagnosis not present

## 2021-01-26 DIAGNOSIS — R768 Other specified abnormal immunological findings in serum: Secondary | ICD-10-CM

## 2021-01-26 DIAGNOSIS — I7300 Raynaud's syndrome without gangrene: Secondary | ICD-10-CM

## 2021-02-04 ENCOUNTER — Other Ambulatory Visit: Payer: Self-pay | Admitting: *Deleted

## 2021-02-23 NOTE — Progress Notes (Signed)
Office Visit Note  Patient: Marissa Salazar             Date of Birth: [birth year]           MRN: [id number]             PCP: Lorrene Reid, PA-C Referring: Lorrene Reid, PA-C Visit Date: 02/24/2021   Subjective:   History of Present Illness: Marissa Salazar is a 41 y.o. female here for follow up with headaches, paresthesias, skin flushing and positive ANA. AV[facility] CTD labs checked after initial visit these show ANA positive by Hep-2 at 1:640 speckled also positive phosphatidylserine IgM otherwise negative panel. Symptoms remain the same she has continued episodes of hand redness and flushing with burning or tinlging symptoms.   Previous HPI 01/26/21 Marissa Salazar is a 41 y.o. female here for evaluation of positive ANA associated with ongoing headaches and skin flushing and tingling sensations. Symptoms with insidious onset but at least present for past 3 months. She has episodes lasting up to an hour or so at a time with severe pain and burning discomfort and skin redness affecting her face, neck, and upper chest. She notices significant central facial swelling. These are associated with headache and lightheadedness sensation. Sometimes she feels numbness and tingling to extremities during the symptoms. In between pretty much completely resolves. Evaluations so far including Ct and MRI of the head without structural cause of headache and dizziness symptoms. She saw endocrinology for high cortisol and flushing with no specific concerning findings at initial visit. Abdominal CT imaging showing cysts at left ovary and liver without any atypical or high risk features. Lab workup has been unremarkable besides positive ANA with negative reflex ENA panel. During the past 2 years she had COVID infections without severe pulmonary disease but required hysterectomy in 10/2019 due to refractory fibroid bleeding associated with the infection. So far no significant benefit with anything. She tried taking  gabapentin with no significant improvement. She has usually been very physically active as a runner but very decreased since to the ongoing symptoms. She reports having some weight loss despite eating well and lack of activity.   Labs reviewed 11/2020 ANA 1:160 speckled dsDNA, SM, RNP, Scl-70, SSA, SSB neg ESR 2 CRP <1 CA 125 neg TSH, T3, T4 wnl   Review of Systems  Constitutional:  Positive for fatigue.  HENT:  Positive for nose dryness. Negative for mouth sores and mouth dryness.   Eyes:  Positive for dryness. Negative for pain and itching.  Respiratory:  Positive for shortness of breath. Negative for difficulty breathing.   Cardiovascular:  Positive for palpitations. Negative for chest pain.  Gastrointestinal:  Negative for blood in stool, constipation and diarrhea.  Endocrine: Negative for increased urination.  Genitourinary:  Negative for difficulty urinating.  Musculoskeletal:  Positive for myalgias, morning stiffness, muscle tenderness and myalgias. Negative for joint pain, joint pain and joint swelling.  Skin:  Positive for color change and redness. Negative for rash.  Allergic/Immunologic: Negative for susceptible to infections.  Neurological:  Positive for numbness and headaches. Negative for dizziness, memory loss and weakness.  Hematological:  Negative for bruising/bleeding tendency.  Psychiatric/Behavioral:  Negative for confusion.    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   Elevated cortisol level 11/05/2020   Facial swelling 11/05/2020   Fatigue  11/05/2020   Left lower quadrant abdominal tenderness without rebound tenderness 11/05/2020   Gastritis due to Helicobacter species 78/46/9629   Lymphadenopathy 08/10/2020   Polyuria 08/10/2020   Bunion 05/07/2020   Dermatitis 05/07/2020    Gastroenteritis 05/07/2020   Raynaud's phenomenon 05/07/2020   Symptomatic anemia 10/05/2019   Uterine leiomyoma 52/84/1324   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   Family history of breast cancer    Family history of colon cancer    Family history of pancreatic cancer    Fibrositis 11/07/2018   Iron deficiency anemia 03/29/2018   Elevated pulse rate 03/29/2018   History of uterine fibroid 03/29/2018   GERD (gastroesophageal reflux disease) 40/11/2723   History of Helicobacter pylori infection 05/23/2017   Nevus 09/29/2016   Family history of breast cancer in female 05/28/2014    Past Medical History:  Diagnosis Date   Abnormal Pap smear    in past; normal colpo   Allergic rhinitis    Allergy    Anemia    Hx fibroids   Blood transfusion without reported diagnosis 05/2019   Dysplastic nevus 10/05/2016   R infra axillary, mild atypia   Family history of breast cancer    Family history of colon cancer    Family history of pancreatic cancer    Fibroid    Headache    Iron deficiency anemia due to chronic blood loss    Lab test positive for detection of COVID-19 virus 10/05/2019   Menorrhagia 03/29/2018   Superficial vein thrombosis 2019   Left leg   Uterine fibroid 05/09/2019   sonohysterogram showing 7.8 x 7.4 cm fibroid with 4.6 cm in endometrial cavity - Dr. [name]    Family History  Problem Relation Age of Onset   Multiple myeloma Father    Breast cancer Maternal Aunt 60       BRCA negative   Pancreatic cancer Maternal Aunt 44   Heart attack Maternal Uncle    Diabetes Paternal Uncle    Colon cancer Maternal Grandmother 50   Breast cancer Maternal Grandmother 54   Hypertension Maternal Grandmother    Breast cancer Other        GP   Lung cancer Other        GP   Esophageal cancer Neg Hx    Liver cancer Neg Hx    Stomach cancer Neg Hx    Past Surgical History:  Procedure Laterality Date   ABDOMINAL HYSTERECTOMY     BREAST BIOPSY Left 06/12/2014    fibroadenoma   BUNIONECTOMY Right 2013   COLPOSCOPY  2009   ESOPHAGOGASTRODUODENOSCOPY N/A 07/17/2015   Procedure: ESOPHAGOGASTRODUODENOSCOPY (EGD);  Surgeon: Manya Silvas, MD;  Location: [facility] ENDOSCOPY;  Service: Endoscopy;  Laterality: N/A;   Social History   Social History Narrative   Lives children   Regular exercise   Caffeine- coffee 2 c but stopped   Immunization History  Administered Date(s) Administered   Hepatitis A 02/13/2002, 05/12/2002   Hpv-Unspecified 01/18/2006   IPV 02/08/2002   Influenza Whole 11/28/2008   Influenza,inj,Quad PF,6+ Mos 11/12/2019   Influenza-Unspecified 11/21/2013, 10/13/2016   Meningococcal Conjugate 02/08/2002   PFIZER(Purple Top)SARS-COV-2 Vaccination 05/11/2019, 06/01/2019   PPD Test 11/12/2019   Td 10/23/2001, 02/08/2002   Tdap 08/23/2010     Objective: Vital Signs: BP 113/72 (BP Location: Left Arm, Patient Position: Sitting, Cuff Size: Normal)    Pulse (!) 105    Ht 5' 4"  (1.626 m)    Wt 126 lb 6.4 oz (57.3  kg)    LMP  (LMP Unknown)    BMI 21.70 kg/m    Physical Exam Cardiovascular:     Rate and Rhythm: Normal rate and regular rhythm.  Pulmonary:     Effort: Pulmonary effort is normal.     Breath sounds: Normal breath sounds.  Skin:    General: Skin is warm and dry.  Neurological:     Mental Status: She is alert.  Psychiatric:        Mood and Affect: Mood normal.     Musculoskeletal Exam:  Elbows full ROM no tenderness or swelling Wrists full ROM no tenderness or swelling Fingers full ROM no tenderness or swelling   Investigation: No additional findings.  Imaging: No results found.  Recent Labs: Lab Results  Component Value Date   WBC 5.2 07/31/2020   HGB 14.0 07/31/2020   PLT 250 07/31/2020   NA 138 07/31/2020   K 4.5 07/31/2020   CL 100 07/31/2020   CO2 25 07/31/2020   GLUCOSE 71 07/31/2020   BUN 16 07/31/2020   CREATININE 0.79 07/31/2020   BILITOT 0.4 05/07/2020   ALKPHOS 54 05/07/2020   AST 31  05/07/2020   ALT 24 05/07/2020   PROT 7.5 05/07/2020   ALBUMIN 4.6 05/07/2020   CALCIUM 9.5 07/31/2020   GFRAA 118 11/12/2019    Speciality Comments: No specialty comments available.  Procedures:  No procedures performed Allergies: Ivp dye [iodinated contrast media]   Assessment / Plan:     Visit Diagnoses: Positive ANA (antinuclear antibody)  Raynaud's phenomenon without gangrene  Lymphadenopathy - Plan: hydroxychloroquine (PLAQUENIL) 200 MG tablet, Ambulatory referral to Ophthalmology  Positive ANA but cannot confirm systemic inflammatory process based on current symptoms. May be a false positive or could represent UCTD with nonspecific symptoms. Discussed observation vs trial of treatment we will try HCQ 200 mg daily as lower risk initial DMARD treatment.  High risk medication use - Plan: Ambulatory referral to Ophthalmology  Planning to start HCQ she needs baseline Ophthalnmology evaluation for this. She also has persistent dry eye symptoms with questionable autoimmune disease will benefit with exam for this too.  Orders: Orders Placed This Encounter  Procedures   Ambulatory referral to Ophthalmology   Meds ordered this encounter  Medications   hydroxychloroquine (PLAQUENIL) 200 MG tablet    Sig: Take 1 tablet (200 mg total) by mouth daily.    Dispense:  30 tablet    Refill:  2     Follow-Up Instructions: Return in about 12 weeks (around 05/19/2021) for Possible UCTD/?APS HCQ trial f/u 42mo.   CCollier Salina MD  Note - This record has been created using [employer]  Chart creation errors have been sought, but may not always  have been located. Such creation errors do not reflect on  the standard of medical care.

## 2021-02-24 ENCOUNTER — Ambulatory Visit (INDEPENDENT_AMBULATORY_CARE_PROVIDER_SITE_OTHER): Payer: No Typology Code available for payment source | Admitting: Internal Medicine

## 2021-02-24 ENCOUNTER — Encounter: Payer: Self-pay | Admitting: Internal Medicine

## 2021-02-24 ENCOUNTER — Other Ambulatory Visit: Payer: Self-pay

## 2021-02-24 VITALS — BP 113/72 | HR 105 | Ht 64.0 in | Wt 126.4 lb

## 2021-02-24 DIAGNOSIS — R591 Generalized enlarged lymph nodes: Secondary | ICD-10-CM | POA: Diagnosis not present

## 2021-02-24 DIAGNOSIS — R768 Other specified abnormal immunological findings in serum: Secondary | ICD-10-CM | POA: Diagnosis not present

## 2021-02-24 DIAGNOSIS — Z79899 Other long term (current) drug therapy: Secondary | ICD-10-CM | POA: Diagnosis not present

## 2021-02-24 DIAGNOSIS — I7300 Raynaud's syndrome without gangrene: Secondary | ICD-10-CM | POA: Diagnosis not present

## 2021-02-24 MED ORDER — HYDROXYCHLOROQUINE SULFATE 200 MG PO TABS
200.0000 mg | ORAL_TABLET | Freq: Every day | ORAL | 2 refills | Status: DC
  Filled 2021-02-24: qty 30, 30d supply, fill #0

## 2021-02-26 ENCOUNTER — Other Ambulatory Visit: Payer: Self-pay

## 2021-02-26 ENCOUNTER — Ambulatory Visit: Payer: No Typology Code available for payment source | Admitting: Internal Medicine

## 2021-03-01 ENCOUNTER — Other Ambulatory Visit: Payer: Self-pay

## 2021-03-01 MED ORDER — IBUPROFEN 800 MG PO TABS
ORAL_TABLET | ORAL | 2 refills | Status: DC
  Filled 2021-03-01: qty 30, 10d supply, fill #0

## 2021-03-05 ENCOUNTER — Other Ambulatory Visit: Payer: Self-pay

## 2021-03-09 ENCOUNTER — Ambulatory Visit: Payer: No Typology Code available for payment source | Admitting: Psychiatry

## 2021-04-28 ENCOUNTER — Telehealth: Payer: Self-pay | Admitting: Nurse Practitioner

## 2021-04-28 ENCOUNTER — Encounter: Payer: Self-pay | Admitting: Nurse Practitioner

## 2021-04-28 NOTE — Telephone Encounter (Signed)
We can reschedule the labs, or I can put the orders in to have them done at her workplace. Either is fine.

## 2021-04-28 NOTE — Telephone Encounter (Signed)
She would like them done at her place of work so she doesn't have to miss work and then discuss with you at her physical appointment.  ?

## 2021-04-28 NOTE — Telephone Encounter (Signed)
Patient has a physical scheduled for next week and fbw scheduled however she cannot come in the day of the fbw and wants to know if you can order it and she get it done where she works which is at another [employer] facility? ?

## 2021-04-29 ENCOUNTER — Other Ambulatory Visit: Payer: Self-pay | Admitting: Nurse Practitioner

## 2021-04-29 DIAGNOSIS — R7689 Other specified abnormal immunological findings in serum: Secondary | ICD-10-CM

## 2021-04-29 DIAGNOSIS — R7989 Other specified abnormal findings of blood chemistry: Secondary | ICD-10-CM

## 2021-04-29 DIAGNOSIS — R5383 Other fatigue: Secondary | ICD-10-CM

## 2021-04-29 DIAGNOSIS — E2839 Other primary ovarian failure: Secondary | ICD-10-CM

## 2021-04-29 DIAGNOSIS — E559 Vitamin D deficiency, unspecified: Secondary | ICD-10-CM

## 2021-04-29 DIAGNOSIS — R768 Other specified abnormal immunological findings in serum: Secondary | ICD-10-CM

## 2021-04-29 DIAGNOSIS — R220 Localized swelling, mass and lump, head: Secondary | ICD-10-CM

## 2021-04-29 NOTE — Telephone Encounter (Signed)
Her labs have been ordered and released in [place]. I sent her MyChart message to let her know that were ready

## 2021-04-29 NOTE — Progress Notes (Signed)
Lab orders placed in epic. To be drawn at patient's place of employment.  ?

## 2021-04-29 NOTE — Telephone Encounter (Signed)
Patient is aware 

## 2021-04-30 ENCOUNTER — Other Ambulatory Visit: Payer: No Typology Code available for payment source

## 2021-05-03 ENCOUNTER — Encounter: Payer: Self-pay | Admitting: Hematology and Oncology

## 2021-05-04 ENCOUNTER — Encounter: Payer: Self-pay | Admitting: Nurse Practitioner

## 2021-05-04 LAB — COMPREHENSIVE METABOLIC PANEL
ALT: 20 IU/L (ref 0–32)
AST: 28 IU/L (ref 0–40)
Albumin/Globulin Ratio: 1.7 (ref 1.2–2.2)
Albumin: 4.5 g/dL (ref 3.8–4.8)
Alkaline Phosphatase: 45 IU/L (ref 44–121)
BUN/Creatinine Ratio: 28 — ABNORMAL HIGH (ref 9–23)
BUN: 25 mg/dL — ABNORMAL HIGH (ref 6–20)
Bilirubin Total: <0.2 mg/dL (ref 0.0–1.2)
CO2: 20 mmol/L (ref 20–29)
Calcium: 9.2 mg/dL (ref 8.7–10.2)
Chloride: 103 mmol/L (ref 96–106)
Creatinine, Ser: 0.88 mg/dL (ref 0.57–1.00)
Globulin, Total: 2.6 g/dL (ref 1.5–4.5)
Glucose: 84 mg/dL (ref 70–99)
Potassium: 4.9 mmol/L (ref 3.5–5.2)
Sodium: 139 mmol/L (ref 134–144)
Total Protein: 7.1 g/dL (ref 6.0–8.5)
eGFR: 86 mL/min/{1.73_m2} (ref >59)

## 2021-05-04 LAB — TSH+FREE T4
Free T4: 1.16 ng/dL (ref 0.82–1.77)
TSH: 1.08 u[IU]/mL (ref 0.450–4.500)

## 2021-05-04 LAB — CBC
Hematocrit: 43.5 % (ref 34.0–46.6)
Hemoglobin: 14 g/dL (ref 11.1–15.9)
MCH: 30.2 pg (ref 26.6–33.0)
MCHC: 32.2 g/dL (ref 31.5–35.7)
MCV: 94 fL (ref 79–97)
Platelets: 318 10*3/uL (ref 150–450)
RBC: 4.64 x10E6/uL (ref 3.77–5.28)
RDW: 12.2 % (ref 11.7–15.4)
WBC: 6.9 10*3/uL (ref 3.4–10.8)

## 2021-05-04 LAB — VITAMIN D 25 HYDROXY (VIT D DEFICIENCY, FRACTURES): Vit D, 25-Hydroxy: 50.3 ng/mL (ref 30.0–100.0)

## 2021-05-04 LAB — ESTRADIOL: Estradiol: 23.4 pg/mL

## 2021-05-04 LAB — FSH/LH
FSH: 17.9 m[IU]/mL
LH: 10.7 m[IU]/mL

## 2021-05-04 LAB — SEDIMENTATION RATE: Sed Rate: 6 mm/hr (ref 0–32)

## 2021-05-04 LAB — CORTISOL: Cortisol: 14.9 ug/dL

## 2021-05-04 LAB — PROGESTERONE: Progesterone: <0.1 ng/mL

## 2021-05-04 NOTE — Progress Notes (Signed)
Progesterone is <0.1. likely represents post menopausal.

## 2021-05-05 ENCOUNTER — Ambulatory Visit (INDEPENDENT_AMBULATORY_CARE_PROVIDER_SITE_OTHER): Payer: No Typology Code available for payment source | Admitting: Nurse Practitioner

## 2021-05-05 ENCOUNTER — Encounter: Payer: Self-pay | Admitting: Nurse Practitioner

## 2021-05-05 VITALS — BP 90/54 | HR 88 | Temp 98.7°F | Ht 64.17 in | Wt 126.2 lb

## 2021-05-05 DIAGNOSIS — R5383 Other fatigue: Secondary | ICD-10-CM

## 2021-05-05 DIAGNOSIS — N83202 Unspecified ovarian cyst, left side: Secondary | ICD-10-CM | POA: Diagnosis not present

## 2021-05-05 DIAGNOSIS — R10814 Left lower quadrant abdominal tenderness: Secondary | ICD-10-CM | POA: Diagnosis not present

## 2021-05-05 DIAGNOSIS — Z0001 Encounter for general adult medical examination with abnormal findings: Secondary | ICD-10-CM | POA: Diagnosis not present

## 2021-05-05 DIAGNOSIS — Z0000 Encounter for general adult medical examination without abnormal findings: Secondary | ICD-10-CM

## 2021-05-05 MED ORDER — MEDROXYPROGESTERONE ACETATE 150 MG/ML IM SUSP: INTRAMUSCULAR | 11 refills | Status: DC

## 2021-05-05 MED ORDER — NORETHINDRONE 0.35 MG PO TABS: ORAL_TABLET | ORAL | 3 refills | Status: DC

## 2021-05-05 NOTE — Progress Notes (Signed)
Established patient visit ? ? ?Patient: Marissa Salazar   DOB: 1980-06-25   41 y.o. Female  MRN: [id number] ?Visit Date: 05/05/2021 ? ? ?Chief Complaint  ?Patient presents with  ? Annual Exam  ? ?Subjective  ?  ?HPI  ?The patient presents for annual wellness visit.  ?-continues to have left lower quadrant abdominal pain. Growing abdominal cyst. Has done trial of estrogen supplement. Made symptoms worse. Hesitant to do progesterone trial.  ?-Fatigue.  ?-labs done prior to visit.-thyroid panel normal.  ?-progesterone very low.  ?-estradiol, FSH, and LH indicate circulating reproductive hormones.  ?-other labs essentially normal.  ?-no new concerns or complaints today.  ? ? ?Medications: ?Outpatient Medications Prior to Visit  ?Medication Sig  ? cholecalciferol (VITAMIN D3) 25 MCG (1000 UNIT) tablet Take 1,000 Units by mouth daily.  ? cyclobenzaprine (FLEXERIL) 10 MG tablet Take 1 tablet by mouth 3 times a day as needed  ? hydroxychloroquine (PLAQUENIL) 200 MG tablet Take 1 tablet (200 mg total) by mouth daily.  ? ibuprofen (ADVIL) 800 MG tablet Take 1 tablet every 6-8 hours by oral route as needed for 10 days.  ? Multiple Vitamin (MULTIVITAMIN) tablet Take 1 tablet by mouth daily.  ? zolpidem (AMBIEN) 10 MG tablet TAKE 1 TABLET BY MOUTH AT BEDTIME AS NEEDED FOR INSOMNIA  ? [DISCONTINUED] gabapentin (NEURONTIN) 300 MG capsule Take 1 capsule (300 mg total) by mouth 3 (three) times daily. (Patient not taking: Reported on 01/11/2021)  ? [DISCONTINUED] lidocaine (LIDODERM) 5 % APPLY 1 PATCH BY TOPICAL ROUTE ONCE DAILY (MAY WEAR UP TO 12HOURS.) (Patient not taking: Reported on 01/26/2021)  ? [DISCONTINUED] medroxyPROGESTERone (DEPO-PROVERA) 150 MG/ML injection medroxyprogesterone 150 mg/mL intramuscular suspension (Patient not taking: Reported on 01/26/2021)  ? [DISCONTINUED] methylPREDNISolone (MEDROL) 4 MG TBPK tablet Take by mouth as directed for 6 days (Patient not taking: Reported on 01/26/2021)  ? [DISCONTINUED]  norethindrone (MICRONOR) 0.35 MG tablet Take 1 tablet by mouth daily (Patient not taking: Reported on 01/26/2021)  ? ?No facility-administered medications prior to visit.  ? ? ?Review of Systems  ?Constitutional:  Positive for fatigue. Negative for activity change, appetite change, chills and fever.  ?HENT:  Negative for congestion, postnasal drip, rhinorrhea, sinus pressure, sinus pain, sneezing and sore throat.   ?Eyes: Negative.   ?Respiratory:  Negative for cough, chest tightness, shortness of breath and wheezing.   ?Cardiovascular:  Negative for chest pain and palpitations.  ?Gastrointestinal:  Negative for abdominal pain, constipation, diarrhea, nausea and vomiting.  ?Endocrine: Negative for cold intolerance, heat intolerance, polydipsia and polyuria.  ?Genitourinary:  Positive for pelvic pain. Negative for dyspareunia, dysuria, flank pain, frequency and urgency.  ?     Left lower quadrant Is persistent and worsening.   ?Musculoskeletal:  Negative for arthralgias, back pain and myalgias.  ?Skin:  Negative for rash.  ?Allergic/Immunologic: Negative for environmental allergies.  ?Neurological:  Negative for dizziness, weakness and headaches.  ?Hematological:  Negative for adenopathy.  ?Psychiatric/Behavioral:  Positive for sleep disturbance. The patient is nervous/anxious.   ? ?Last CBC ?Lab Results  ?Component Value Date  ? WBC 6.9 05/03/2021  ? HGB 14.0 05/03/2021  ? HCT 43.5 05/03/2021  ? MCV 94 05/03/2021  ? MCH 30.2 05/03/2021  ? RDW 12.2 05/03/2021  ? PLT 318 05/03/2021  ? ?Last metabolic panel ?Lab Results  ?Component Value Date  ? GLUCOSE 84 05/03/2021  ? NA 139 05/03/2021  ? K 4.9 05/03/2021  ? CL 103 05/03/2021  ? CO2 20 05/03/2021  ? BUN  25 (H) 05/03/2021  ? CREATININE 0.88 05/03/2021  ? EGFR 86 05/03/2021  ? CALCIUM 9.2 05/03/2021  ? PROT 7.1 05/03/2021  ? ALBUMIN 4.5 05/03/2021  ? LABGLOB 2.6 05/03/2021  ? AGRATIO 1.7 05/03/2021  ? BILITOT <0.2 05/03/2021  ? ALKPHOS 45 05/03/2021  ? AST 28  05/03/2021  ? ALT 20 05/03/2021  ? ANIONGAP 5 11/25/2019  ? ?Last lipids ?Lab Results  ?Component Value Date  ? CHOL 213 (H) 05/06/2021  ? HDL 115 05/06/2021  ? [place] 89 05/06/2021  ? LDLDIRECT 59 11/28/2008  ? TRIG 52 05/06/2021  ? CHOLHDL 1.9 05/06/2021  ? ?Last hemoglobin A1c ?Lab Results  ?Component Value Date  ? HGBA1C 5.0 07/31/2020  ? ?Last thyroid functions ?Lab Results  ?Component Value Date  ? TSH 1.080 05/03/2021  ? T3TOTAL 79 05/07/2020  ? ?Last vitamin D ?Lab Results  ?Component Value Date  ? VD25OH 50.3 05/03/2021  ? ?Last vitamin B12 and Folate ?Lab Results  ?Component Value Date  ? HLKTGYBW38 706 10/06/2019  ? FOLATE 22.3 10/06/2019  ? ?  ? ? Objective  ?  ? ?Today's Vitals  ? 05/05/21 1513  ?BP: (!) 90/54  ?Pulse: 88  ?Temp: 98.7 ?F (37.1 ?C)  ?SpO2: 97%  ?Weight: 126 lb 3.2 oz (57.2 kg)  ?Height: 5' 4.17" (1.63 m)  ? ?Body mass index is 21.55 kg/m?.  ? ?BP Readings from Last 3 Encounters:  ?05/05/21 (!) 90/54  ?02/24/21 113/72  ?01/26/21 (!) 88/60  ?  ?Wt Readings from Last 3 Encounters:  ?05/05/21 126 lb 3.2 oz (57.2 kg)  ?02/24/21 126 lb 6.4 oz (57.3 kg)  ?01/26/21 123 lb 9.6 oz (56.1 kg)  ?  ?Physical Exam ?Vitals and nursing note reviewed.  ?Constitutional:   ?   Appearance: Normal appearance. She is well-developed.  ?HENT:  ?   Head: Normocephalic and atraumatic.  ?   Right Ear: Tympanic membrane, ear canal and external ear normal.  ?   Left Ear: Tympanic membrane, ear canal and external ear normal.  ?   Nose: Nose normal.  ?   Mouth/Throat:  ?   Mouth: Mucous membranes are moist.  ?   Pharynx: Oropharynx is clear.  ?Eyes:  ?   Extraocular Movements: Extraocular movements intact.  ?   Conjunctiva/sclera: Conjunctivae normal.  ?   Pupils: Pupils are equal, round, and reactive to light.  ?Cardiovascular:  ?   Rate and Rhythm: Normal rate and regular rhythm.  ?   Pulses: Normal pulses.  ?   Heart sounds: Normal heart sounds.  ?Pulmonary:  ?   Effort: Pulmonary effort is normal.  ?   Breath  sounds: Normal breath sounds.  ?Abdominal:  ?   General: Bowel sounds are normal. There is no distension.  ?   Palpations: Abdomen is soft. There is no mass.  ?   Tenderness: There is abdominal tenderness. There is no right CVA tenderness, left CVA tenderness, guarding or rebound.  ?   Hernia: No hernia is present.  ?   Comments: Left lower quadrant tenderness with palpation.   ?Musculoskeletal:     ?   General: Normal range of motion.  ?   Cervical back: Normal range of motion and neck supple.  ?Lymphadenopathy:  ?   Cervical: No cervical adenopathy.  ?Skin: ?   General: Skin is warm and dry.  ?   Capillary Refill: Capillary refill takes less than 2 seconds.  ?Neurological:  ?   General: No focal deficit  present.  ?   Mental Status: She is alert and oriented to person, place, and time.  ?Psychiatric:     ?   Mood and Affect: Mood normal.     ?   Behavior: Behavior normal.     ?   Thought Content: Thought content normal.     ?   Judgment: Judgment normal.  ?  ? ? Assessment & Plan  ?  ?1. Encounter for general adult medical examination with abnormal findings ?Annual wellness visit today . ? ?2. Other fatigue ?Reviewed labs with essentially normal results. Discussion about stress causing fatigue. Will continue to monitor.  ? ?3. Left lower quadrant abdominal tenderness without rebound tenderness ?Likely related to left ovarian cyst. She will continue to follow up with GYN provider as scheduled.  ? ?4. Cyst of left ovary ?Causing left lower quadrant abdominal tenderness. She will continue to follow up with GYN provider as scheduled.  ? ?5. Health care maintenance ?Check fasting lipid panel and treat high cholesterol as indicate.d  ?- Lipid Profile; Future ?- Lipid Profile  ? ? ?Problem List Items Addressed This Visit   ? ?  ? Endocrine  ? Cyst of left ovary  ?  ? Other  ? Fatigue  ? Left lower quadrant abdominal tenderness without rebound tenderness  ? ?Other Visit Diagnoses   ? ? Encounter for general adult medical  examination with abnormal findings    -  Primary  ? Health care maintenance      ? Relevant Orders  ? Lipid Profile  ? ?  ?  ? ?Return in about 6 months (around 11/05/2021) for fatigue - needs BMP prior to visit .  ?   ? ? ?

## 2021-05-06 ENCOUNTER — Other Ambulatory Visit: Payer: Self-pay | Admitting: Nurse Practitioner

## 2021-05-07 ENCOUNTER — Encounter: Payer: Self-pay | Admitting: Nurse Practitioner

## 2021-05-07 ENCOUNTER — Encounter: Payer: No Typology Code available for payment source | Admitting: Physician Assistant

## 2021-05-07 LAB — LIPID PANEL
Chol/HDL Ratio: 1.9 ratio (ref 0.0–4.4)
Cholesterol, Total: 213 mg/dL — ABNORMAL HIGH (ref 100–199)
HDL: 115 mg/dL (ref >39)
LDL Chol Calc (NIH): 89 mg/dL (ref 0–99)
Triglycerides: 52 mg/dL (ref 0–149)
VLDL Cholesterol Cal: 9 mg/dL (ref 5–40)

## 2021-05-07 NOTE — Progress Notes (Signed)
Patient notified via My Chart and recommendations were provided

## 2021-05-11 ENCOUNTER — Encounter: Payer: Self-pay | Admitting: Obstetrics and Gynecology

## 2021-05-20 ENCOUNTER — Ambulatory Visit: Payer: No Typology Code available for payment source

## 2021-05-20 ENCOUNTER — Ambulatory Visit: Payer: No Typology Code available for payment source | Admitting: Internal Medicine

## 2021-05-31 ENCOUNTER — Other Ambulatory Visit: Payer: Self-pay | Admitting: Obstetrics and Gynecology

## 2021-05-31 DIAGNOSIS — Z9189 Other specified personal risk factors, not elsewhere classified: Secondary | ICD-10-CM

## 2021-06-24 ENCOUNTER — Encounter: Payer: Self-pay | Admitting: Hematology and Oncology

## 2021-06-25 ENCOUNTER — Ambulatory Visit
Admission: RE | Admit: 2021-06-25 | Discharge: 2021-06-25 | Disposition: A | Discharge status: Home / Self Care | Payer: No Typology Code available for payment source | Source: Ambulatory Visit | Attending: Obstetrics and Gynecology | Admitting: Obstetrics and Gynecology

## 2021-06-25 DIAGNOSIS — Z9189 Other specified personal risk factors, not elsewhere classified: Secondary | ICD-10-CM

## 2021-06-25 MED ORDER — GADOBUTROL 1 MMOL/ML IV SOLN
5.0000 mL | Freq: Once | INTRAVENOUS | Status: AC | PRN
  Administered 2021-06-25: 5 mL via INTRAVENOUS

## 2021-06-29 ENCOUNTER — Other Ambulatory Visit: Payer: Self-pay | Admitting: Obstetrics and Gynecology

## 2021-06-29 DIAGNOSIS — R928 Other abnormal and inconclusive findings on diagnostic imaging of breast: Secondary | ICD-10-CM

## 2021-07-08 ENCOUNTER — Ambulatory Visit
Admission: RE | Admit: 2021-07-08 | Discharge: 2021-07-08 | Disposition: A | Discharge status: Home / Self Care | Payer: No Typology Code available for payment source | Source: Ambulatory Visit | Attending: Obstetrics and Gynecology | Admitting: Obstetrics and Gynecology

## 2021-07-08 ENCOUNTER — Other Ambulatory Visit (HOSPITAL_COMMUNITY): Payer: Self-pay | Admitting: Diagnostic Radiology

## 2021-07-08 DIAGNOSIS — R928 Other abnormal and inconclusive findings on diagnostic imaging of breast: Secondary | ICD-10-CM

## 2021-07-08 DIAGNOSIS — Z803 Family history of malignant neoplasm of breast: Secondary | ICD-10-CM

## 2021-07-08 MED ORDER — GADOBUTROL 1 MMOL/ML IV SOLN
5.0000 mL | Freq: Once | INTRAVENOUS | Status: AC | PRN
  Administered 2021-07-08: 5 mL via INTRAVENOUS

## 2021-08-30 ENCOUNTER — Encounter: Payer: Self-pay | Admitting: Hematology and Oncology

## 2021-09-03 ENCOUNTER — Encounter: Payer: Self-pay | Admitting: Hematology and Oncology

## 2021-12-10 ENCOUNTER — Other Ambulatory Visit: Payer: Self-pay | Admitting: Obstetrics and Gynecology

## 2021-12-10 DIAGNOSIS — Z9189 Other specified personal risk factors, not elsewhere classified: Secondary | ICD-10-CM

## 2021-12-22 ENCOUNTER — Encounter: Payer: Self-pay | Admitting: Hematology and Oncology

## 2021-12-23 ENCOUNTER — Encounter: Payer: Self-pay | Admitting: Hematology and Oncology

## 2021-12-24 ENCOUNTER — Encounter: Payer: Self-pay | Admitting: Hematology and Oncology

## 2021-12-27 ENCOUNTER — Encounter: Payer: Self-pay | Admitting: Hematology and Oncology

## 2021-12-28 ENCOUNTER — Encounter: Payer: Self-pay | Admitting: Hematology and Oncology

## 2022-01-01 ENCOUNTER — Encounter: Payer: Self-pay | Admitting: Hematology and Oncology

## 2022-01-05 ENCOUNTER — Encounter: Payer: Self-pay | Admitting: Hematology and Oncology

## 2022-01-08 ENCOUNTER — Ambulatory Visit
Admission: RE | Admit: 2022-01-08 | Discharge: 2022-01-08 | Disposition: A | Discharge status: Home / Self Care | Payer: No Typology Code available for payment source | Source: Ambulatory Visit | Attending: Obstetrics and Gynecology | Admitting: Obstetrics and Gynecology

## 2022-01-08 DIAGNOSIS — Z9189 Other specified personal risk factors, not elsewhere classified: Secondary | ICD-10-CM

## 2022-01-08 MED ORDER — GADOPICLENOL 0.5 MMOL/ML IV SOLN
6.0000 mL | Freq: Once | INTRAVENOUS | Status: AC | PRN
Start: 2022-01-08 — End: 2022-01-08
  Administered 2022-01-08: 6 mL via INTRAVENOUS

## 2022-01-17 ENCOUNTER — Encounter: Payer: Self-pay | Admitting: Hematology and Oncology

## 2022-01-20 ENCOUNTER — Encounter: Payer: Self-pay | Admitting: Hematology and Oncology

## 2022-01-22 ENCOUNTER — Encounter: Payer: Self-pay | Admitting: Hematology and Oncology

## 2022-05-11 ENCOUNTER — Ambulatory Visit: Payer: Managed Care, Other (non HMO) | Admitting: Internal Medicine

## 2022-05-13 ENCOUNTER — Ambulatory Visit: Payer: Managed Care, Other (non HMO) | Admitting: Internal Medicine

## 2022-05-13 NOTE — Progress Notes (Deleted)
Office Visit Note  Patient: Marissa Salazar             Date of Birth: May 12, 1980           MRN: [id number]             PCP: Carlean Jews, NP Referring: Carlean Jews, NP Visit Date: 05/13/2022   Subjective:  No chief complaint on file.   History of Present Illness: Marissa Salazar is a 42 y.o. female here for follow up ***   Previous HPI 02/24/21 Marissa Salazar is a 42 y.o. female here for follow up with headaches, paresthesias, skin flushing and positive ANA. AVISE CTD labs checked after initial visit these show ANA positive by Hep-2 at 1:640 speckled also positive phosphatidylserine IgM otherwise negative panel. Symptoms remain the same she has continued episodes of hand redness and flushing with burning or tinlging symptoms.    Previous HPI 01/26/21 Marissa Salazar is a 42 y.o. female here for evaluation of positive ANA associated with ongoing headaches and skin flushing and tingling sensations. Symptoms with insidious onset but at least present for past 3 months. She has episodes lasting up to an hour or so at a time with severe pain and burning discomfort and skin redness affecting her face, neck, and upper chest. She notices significant central facial swelling. These are associated with headache and lightheadedness sensation. Sometimes she feels numbness and tingling to extremities during the symptoms. In between pretty much completely resolves. Evaluations so far including Ct and MRI of the head without structural cause of headache and dizziness symptoms. She saw endocrinology for high cortisol and flushing with no specific concerning findings at initial visit. Abdominal CT imaging showing cysts at left ovary and liver without any atypical or high risk features. Lab workup has been unremarkable besides positive ANA with negative reflex ENA panel. During the past 2 years she had COVID infections without severe pulmonary disease but required hysterectomy in 10/2019 due to  refractory fibroid bleeding associated with the infection. So far no significant benefit with anything. She tried taking gabapentin with no significant improvement. She has usually been very physically active as a runner but very decreased since to the ongoing symptoms. She reports having some weight loss despite eating well and lack of activity.   Labs reviewed 11/2020 ANA 1:160 speckled dsDNA, SM, RNP, Scl-70, SSA, SSB neg ESR 2 CRP <1 CA 125 neg TSH, T3, T4 wnl   No Rheumatology ROS completed.   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   Elevated cortisol level 11/05/2020   Facial swelling 11/05/2020   Fatigue 11/05/2020   Left lower quadrant abdominal tenderness without rebound tenderness 11/05/2020   Gastritis due to Helicobacter species 08/10/2020   Lymphadenopathy 08/10/2020   Polyuria 08/10/2020   Bunion 05/07/2020   Dermatitis 05/07/2020   Gastroenteritis 05/07/2020   Raynaud's phenomenon 05/07/2020   Symptomatic anemia 10/05/2019   Uterine leiomyoma 04/16/2019   Monoallelic mutation of ATM gene 16/38/4536   Family history of breast cancer    Family history of colon cancer    Family history of pancreatic cancer    Fibrositis 11/07/2018   Iron deficiency anemia 03/29/2018   Elevated pulse rate 03/29/2018   History of uterine fibroid 03/29/2018   GERD (gastroesophageal reflux disease) 05/23/2017   History  of Helicobacter pylori infection 05/23/2017   Nevus 09/29/2016   Family history of breast cancer in female 05/28/2014    Past Medical History:  Diagnosis Date   Abnormal Pap smear    in past; normal colpo   Allergic rhinitis    Allergy    Anemia    Hx fibroids   Blood transfusion without reported diagnosis 05/2019   Dysplastic nevus 10/05/2016   R infra axillary, mild atypia   Family  history of breast cancer    Family history of colon cancer    Family history of pancreatic cancer    Fibroid    Headache    Iron deficiency anemia due to chronic blood loss    Lab test positive for detection of COVID-19 virus 10/05/2019   Menorrhagia 03/29/2018   Monoallelic mutation of ATM gene    Superficial vein thrombosis 2019   Left leg   Uterine fibroid 05/09/2019   sonohysterogram showing 7.8 x 7.4 cm fibroid with 4.6 cm in endometrial cavity - Dr. Rana SnareLowe    Family History  Problem Relation Age of Onset   Multiple myeloma Father    Breast cancer Maternal Aunt 60       BRCA negative   Pancreatic cancer Maternal Aunt 44   Heart attack Maternal Uncle    Diabetes Paternal Uncle    Colon cancer Maternal Grandmother 50   Breast cancer Maternal Grandmother 54   Hypertension Maternal Grandmother    Breast cancer Other        GP   Lung cancer Other        GP   Esophageal cancer Neg Hx    Liver cancer Neg Hx    Stomach cancer Neg Hx    Past Surgical History:  Procedure Laterality Date   ABDOMINAL HYSTERECTOMY  10/30/2019   bilateral salpingectomy - See pathology in Epic:  endometritis, fibroids, salpingitis,.LGSIL of cervix   BREAST BIOPSY Left 06/12/2014   fibroadenoma   BUNIONECTOMY Right 2013   COLPOSCOPY  2009   ESOPHAGOGASTRODUODENOSCOPY N/A 07/17/2015   Procedure: ESOPHAGOGASTRODUODENOSCOPY (EGD);  Surgeon: Scot Junobert T Elliott, MD;  [facility];  Service: Endoscopy;  Laterality: N/A;   Social History   Social History Narrative   Lives children   Regular exercise   Caffeine- coffee 2 c but stopped   Immunization History  Administered Date(s) Administered   Hepatitis A 02/13/2002, 05/12/2002   Hpv-Unspecified 01/18/2006   IPV 02/08/2002   Influenza Whole 11/28/2008   Influenza,inj,Quad PF,6+ Mos 11/12/2019   Influenza-Unspecified 11/21/2013, 10/13/2016   Meningococcal Conjugate 02/08/2002   PFIZER(Purple Top)SARS-COV-2 Vaccination 05/11/2019,  06/01/2019   PPD Test 11/12/2019   Td 10/23/2001, 02/08/2002   Tdap 08/23/2010     Objective: Vital Signs: LMP  (LMP Unknown)    Physical Exam   Musculoskeletal Exam: ***  CDAI Exam: CDAI Score: -- Patient Global: --; Provider Global: -- Swollen: --; Tender: -- Joint Exam 05/13/2022   No joint exam has been documented for this visit   There is currently no information documented on the homunculus. Go to the Rheumatology activity and complete the homunculus joint exam.  Investigation: No additional findings.  Imaging: No results found.  Recent Labs: Lab Results  Component Value Date   WBC 6.9 05/03/2021   HGB 14.0 05/03/2021   PLT 318 05/03/2021   NA 139 05/03/2021   K 4.9 05/03/2021   CL 103 05/03/2021   CO2 20 05/03/2021   GLUCOSE 84 05/03/2021   BUN 25 (H) 05/03/2021  CREATININE 0.88 05/03/2021   BILITOT <0.2 05/03/2021   ALKPHOS 45 05/03/2021   AST 28 05/03/2021   ALT 20 05/03/2021   PROT 7.1 05/03/2021   ALBUMIN 4.5 05/03/2021   CALCIUM 9.2 05/03/2021   GFRAA 118 11/12/2019    Speciality Comments: No specialty comments available.  Procedures:  No procedures performed Allergies: Ivp dye [iodinated contrast media]   Assessment / Plan:     Visit Diagnoses: No diagnosis found.  ***  Orders: No orders of the defined types were placed in this encounter.  No orders of the defined types were placed in this encounter.    Follow-Up Instructions: No follow-ups on file.   Fuller Planhristopher W Ariella Voit, MD  Note - This record has been created using [employer] software.  Chart creation errors have been sought, but may not always  have been located. Such creation errors do not reflect on  the standard of medical care.

## 2022-06-17 ENCOUNTER — Encounter: Payer: Self-pay | Admitting: Hematology and Oncology

## 2022-07-13 ENCOUNTER — Other Ambulatory Visit: Payer: Self-pay | Admitting: Nurse Practitioner

## 2022-07-13 DIAGNOSIS — Z0000 Encounter for general adult medical examination without abnormal findings: Secondary | ICD-10-CM

## 2022-07-29 ENCOUNTER — Ambulatory Visit
Admission: RE | Admit: 2022-07-29 | Discharge: 2022-07-29 | Disposition: A | Discharge status: Home / Self Care | Payer: Managed Care, Other (non HMO) | Source: Ambulatory Visit | Attending: Nurse Practitioner | Admitting: Nurse Practitioner

## 2022-07-29 DIAGNOSIS — Z0000 Encounter for general adult medical examination without abnormal findings: Secondary | ICD-10-CM

## 2022-08-02 NOTE — Progress Notes (Signed)
Negative mammogram  Repeat in 1 year

## 2022-08-29 ENCOUNTER — Other Ambulatory Visit: Payer: Self-pay | Admitting: Obstetrics and Gynecology

## 2022-08-29 DIAGNOSIS — Z1239 Encounter for other screening for malignant neoplasm of breast: Secondary | ICD-10-CM

## 2022-09-19 ENCOUNTER — Encounter: Payer: Self-pay | Admitting: Gastroenterology

## 2022-09-19 ENCOUNTER — Ambulatory Visit: Payer: Managed Care, Other (non HMO) | Admitting: Gastroenterology

## 2022-09-19 ENCOUNTER — Other Ambulatory Visit (INDEPENDENT_AMBULATORY_CARE_PROVIDER_SITE_OTHER): Payer: Managed Care, Other (non HMO)

## 2022-09-19 VITALS — BP 100/62 | HR 68 | Ht 64.0 in | Wt 134.5 lb

## 2022-09-19 DIAGNOSIS — Z1589 Genetic susceptibility to other disease: Secondary | ICD-10-CM | POA: Diagnosis not present

## 2022-09-19 DIAGNOSIS — Z800 Family history of malignant neoplasm of digestive organs: Secondary | ICD-10-CM

## 2022-09-19 DIAGNOSIS — Z1509 Genetic susceptibility to other malignant neoplasm: Secondary | ICD-10-CM | POA: Diagnosis not present

## 2022-09-19 DIAGNOSIS — Z1501 Genetic susceptibility to malignant neoplasm of breast: Secondary | ICD-10-CM | POA: Diagnosis not present

## 2022-09-19 DIAGNOSIS — K219 Gastro-esophageal reflux disease without esophagitis: Secondary | ICD-10-CM

## 2022-09-19 LAB — HEMOGLOBIN A1C: Hgb A1c MFr Bld: 4.9 % (ref 4.6–6.5)

## 2022-09-19 NOTE — Progress Notes (Signed)
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     GI Evaluation to date: - 07/17/2015: EGD with H. pylori gastritis diagnosed by GI at [facility] clinic, improved with antimicrobial therapy.  - 07/31/2020: Follow-up with her PCM from UC. c/o heartburn, diarrhea.  Reflux symptoms worse at night.  Was treated with Biaxin, lansoprazole in addition to continued Augmentin for possible H. pylori.  - 08/20/2020: Follow-up with her PCM. She reported not feeling much better after treatment with amoxicillin, Biaxin, and lansoprazole.  Was then treated with metronidazole 500 mg bid, sucralfate.  Celiac panel negative. - H. pylori negative (although notes say H pylori IgM positive, that was actually the result from 5 years ago) - TTG, antigliadin negative  - 09/02/2020: Evaluation GI clinic.  Minimal change in symptoms with above interventions. - 09/03/2020: EGD: Normal.  Benign gastric/duodenal biopsies - 08/26/2020: Colonoscopy: Skin tags, fair prep but otherwise normal appearing colon mucosa (path benign).  Small internal hemorrhoids.  Repeat age 12 for CRC screening.  Trialed course of Bentyl vs    -11/10/2020: CT Abd/pelvis: 2.8 cm left ovarian cyst, incidental benign hepatic lesions (benign cysts vs biliary hamartomas) with otherwise normal liver, pancreas, GI tract - 12/05/2020: MRI brain: Normal       Family history notable for MGM with Breast CA and Colon CA (died at 58) and Maternal Aunt with Pancreatic CA (died at 38). Mother gets no screening.  Interestingly, patient herself has an ATM gene mutation noted on genetic testing in 07/2018  HPI:     Patient is a 42 y.o. female presenting to the Gastroenterology Clinic for follow-up.  She was  last seen by me in the GI clinic on 11/17/2020.  She would like to discuss Pancreatic Cancer screening due to family history of PCP and personal history of ATM gene mutation.  No abdominal pain, jaundice, icteric sclera, change in appetite, nausea/vomiting, etc.  Will have occasional reflux sxs, largely well controlled with omeprazole on-demand, which she uses approximately 1-2 times every 2 weeks or so.  No dysphagia.  Review of systems:     No chest pain, no SOB, no fevers, no urinary sx   Past Medical History:  Diagnosis Date   Abnormal Pap smear    in past; normal colpo   Allergic rhinitis    Allergy    Anemia    Hx fibroids   Blood transfusion without reported diagnosis 05/2019   Dysplastic nevus 10/05/2016   R infra axillary, mild atypia   Family history of breast cancer    Family history of colon cancer    Family history of pancreatic cancer    Fibroid    Headache    Iron deficiency anemia due to chronic blood loss    Lab test positive for detection of COVID-19 virus 10/05/2019   Menorrhagia 03/29/2018   Monoallelic mutation of ATM gene    Superficial vein thrombosis 2019   Left leg   Uterine fibroid 05/09/2019   sonohysterogram showing 7.8 x 7.4 cm fibroid with 4.6 cm in endometrial cavity - Dr. Rana Snare    Patient's surgical history, family medical history, social history, medications and allergies were all reviewed in Epic    Current Outpatient Medications  Medication Sig Dispense Refill   cholecalciferol (VITAMIN D3) 25  MCG (1000 UNIT) tablet Take 1,000 Units by mouth daily.     EYSUVIS 0.25 % SUSP Place 1 drop into both eyes as needed.     Multiple Vitamin (MULTIVITAMIN) tablet Take 1 tablet by mouth daily.     omeprazole (PRILOSEC) 40 MG capsule Take 40 mg by mouth as needed.     No current facility-administered medications for this visit.    Physical Exam:     Ht 5\' 4"  (1.626 m)   Wt 134 lb 8 oz (61 kg)   LMP  (LMP Unknown)   BMI 23.09 kg/m   GENERAL:   Pleasant female in NAD PSYCH: : Cooperative, normal affect NEURO: Alert and oriented x 3, no focal neurologic deficits   IMPRESSION and PLAN:    1) Family History of Pancreatic Cancer 2) Personal history of ATM gene mutation (07/2018 genetic testing)  We again reviewed the most recent International CAPS Consortium, ASGE, and AGA guidelines, regarding screening for pancreatic cancer recommended for select high risk individuals.  She has a elevated risk gene mutation and family history of pancreatic CA and a second-degree relative (her mother's sister).  Her mother does not undergo any medical screening, and therefore we do not really know much about her clinical history.  After careful consideration and joint decision-making, I believe this patient meets the high risk screening cohort based on the following: -Carriers of a germline BRCA2, BRCA1, p16, PALB2, ATM, MLH1, MSH2, or MSH6 (ie, HNPCC) gene mutation with a family history of Pancreatic Cancer  Recommended age to start surveillance varies by gene mutation status and family history, as follows: -BRCA2, ATM, PALB 2, BRCA1, MLH1/MSH2-start at age 9 or 61, or 10 years younger than the youngest affected blood relative (her affected family member died at age 32)  Screening techniques include the following: -Baseline: MRI/MRCP plus EUS plus fasting blood glucose and/or hemoglobin A1c  -Follow-up/surveillance phase: Alternate MRI/MRCP and EUS plus routine fasting blood glucose and/or hemoglobin A1c -If concerning features on imaging, check CA 19-9 -EUS with FNA for solid lesions ?5 mm, cystic lesions with worrisome features, or asymptomatic main pancreatic duct (MPD) strictures (with or without mass) -CT only for solid lesions, regardless of size, or asymptomatic MPD strictures of unknown etiology (without mass)  -Screening interval every 12 months in patients with no abnormalities/nonconcerning abnormalities -Screening interval every 3-6 months  in patients with abnormalities that are NOT suspicious for malignancy, but are concerning -EUS evaluation should be performed within 3-6 months for indeterminate lesions (abnormalities that are NOT suspicious for malignancy, but are concerning) -EUS evaluation should be performed within 3 months for high-risk lesions, if surgical resection is not planned. -Immediate surgical referral for abnormality suspicious for malignancy  -New-onset diabetes in a high-risk individual should lead to additional diagnostic studies or change in surveillance interval  -Genetic testing and counseling should be considered for familial pancreas cancer relatives who are eligible for surveillance. A positive germline mutation is associated with an increased risk of neoplastic progression and may also lead to screening for other relevant associated cancers -Participation in a registry or referral to a pancreas Center of Excellence should be pursued when possible for high-risk patients undergoing pancreas cancer screening -The target detectable pancreatic neoplasms are resectable stage I pancreatic ductal adenocarcinoma and high-risk precursor neoplasms, such as intraductal papillary mucinous neoplasms with high-grade dysplasia and some enlarged pancreatic intraepithelial neoplasias  -We discussed the limitations and potential risks of pancreas cancer screening prior to initiating any screening program, and the patient  wishes to proceed with screening. Plan for the following:  - MRI/MRCP now  - Hgb A1c now - EUS in 6 months - RTC every 6 months - If index MRI/MRCP and EUS are unrevealing/normal, per protocol, will liberalize to annual screening, alternating between MRI/MRCP and EUS   3) GERD Intermittent reflux symptoms well-controlled with dietary modification and intermittent use of PPI. - Ok to continue PPI on demand - Avoidance of exacerbating foods           Shellia Cleverly ,DO, FACG 09/19/2022, 1:21  PM

## 2022-09-19 NOTE — Patient Instructions (Addendum)
Your provider has requested that you go to the basement level for lab work before leaving today. Press "B" on the elevator. The lab is located at the first door on the left as you exit the elevator.   You have been scheduled for an MRI at [facility]  on 09/23/22. Your appointment time is 7AM. Please arrive to admitting (at main entrance of the hospital) 30 minutes prior to your appointment time for registration purposes. Please make certain not to have anything to eat or drink 6 hours prior to your test. In addition, if you have any metal in your body, have a pacemaker or defibrillator, please be sure to let your ordering physician know. This test typically takes 45 minutes to 1 hour to complete. Should you need to reschedule, please call [phone] to do so.  _______________________________________________________  If your blood pressure at your visit was 140/90 or greater, please contact your primary care physician to follow up on this.  _______________________________________________________  If you are age 40 or older, your body mass index should be between 23-30. Your Body mass index is 23.09 kg/m. If this is out of the aforementioned range listed, please consider follow up with your Primary Care Provider.  If you are age 69 or younger, your body mass index should be between 19-25. Your Body mass index is 23.09 kg/m. If this is out of the aformentioned range listed, please consider follow up with your Primary Care Provider.   __________________________________________________________  The [facility] providers would like to encourage you to use [facility] to communicate with providers for non-urgent requests or questions.  Due to long hold times on the telephone, sending your provider a message by [facility] may be a faster and more efficient way to get a response.  Please allow 48 business hours for a response.  Please remember that this is for non-urgent requests.   Due to recent changes in  healthcare laws, you may see the results of your imaging and laboratory studies on MyChart before your provider has had a chance to review them.  We understand that in some cases there may be results that are confusing or concerning to you. Not all laboratory results come back in the same time frame and the provider may be waiting for multiple results in order to interpret others.  Please give [place] 48 hours in order for your provider to thoroughly review all the results before contacting the office for clarification of your results.     Thank you for choosing me and [facility].  Vito Cirigliano, D.O.

## 2022-09-21 ENCOUNTER — Telehealth: Payer: Self-pay | Admitting: *Deleted

## 2022-09-21 NOTE — Telephone Encounter (Signed)
Spoke with Marissa Salazar who called the office to make a new patient appointment referred to by her OB/Gyn Dr. Rana Snare. Pt was given an appt. Thursday, September 12 th at 0900 with an arrival time of 0845 for check in.  Pt agreed to date and time. Pt reminded that the office will call a day or two prior to her appointment for reminder.

## 2022-09-22 ENCOUNTER — Ambulatory Visit: Payer: Managed Care, Other (non HMO) | Admitting: Gastroenterology

## 2022-09-22 ENCOUNTER — Telehealth: Payer: Self-pay | Admitting: Gastroenterology

## 2022-09-22 DIAGNOSIS — Z800 Family history of malignant neoplasm of digestive organs: Secondary | ICD-10-CM

## 2022-09-22 DIAGNOSIS — Z1509 Genetic susceptibility to other malignant neoplasm: Secondary | ICD-10-CM

## 2022-09-22 NOTE — Telephone Encounter (Signed)
Called patient and LVM that I will be faxing MRI/MRCP order to [facility] imaging in [place].

## 2022-09-22 NOTE — Telephone Encounter (Signed)
Patient asked that her MRI orders be sent to [facility] in [place].  Please call patient and advise.  Thank you.

## 2022-09-23 ENCOUNTER — Ambulatory Visit (HOSPITAL_COMMUNITY): Payer: Managed Care, Other (non HMO)

## 2022-09-29 ENCOUNTER — Ambulatory Visit: Payer: Managed Care, Other (non HMO) | Admitting: Dermatology

## 2022-09-29 ENCOUNTER — Encounter: Payer: Self-pay | Admitting: Dermatology

## 2022-09-29 VITALS — BP 87/56 | HR 63

## 2022-09-29 DIAGNOSIS — Z1283 Encounter for screening for malignant neoplasm of skin: Secondary | ICD-10-CM | POA: Diagnosis not present

## 2022-09-29 DIAGNOSIS — L578 Other skin changes due to chronic exposure to nonionizing radiation: Secondary | ICD-10-CM

## 2022-09-29 DIAGNOSIS — Z79899 Other long term (current) drug therapy: Secondary | ICD-10-CM

## 2022-09-29 DIAGNOSIS — L814 Other melanin hyperpigmentation: Secondary | ICD-10-CM | POA: Diagnosis not present

## 2022-09-29 DIAGNOSIS — Z86018 Personal history of other benign neoplasm: Secondary | ICD-10-CM

## 2022-09-29 DIAGNOSIS — D229 Melanocytic nevi, unspecified: Secondary | ICD-10-CM

## 2022-09-29 DIAGNOSIS — D2239 Melanocytic nevi of other parts of face: Secondary | ICD-10-CM

## 2022-09-29 DIAGNOSIS — Z7189 Other specified counseling: Secondary | ICD-10-CM

## 2022-09-29 DIAGNOSIS — L811 Chloasma: Secondary | ICD-10-CM

## 2022-09-29 NOTE — Progress Notes (Signed)
Follow-Up Visit   Subjective  Marissa Salazar is a 42 y.o. female who presents for the following: Skin Cancer Screening and Full Body Skin Exam, hx of Dysplastic nevus.   The patient presents for Total-Body Skin Exam (TBSE) for skin cancer screening and mole check. The patient has spots, moles and lesions to be evaluated, some may be new or changing and the patient may have concern these could be cancer.    The following portions of the chart were reviewed this encounter and updated as appropriate: medications, allergies, medical history  Review of Systems:  No other skin or systemic complaints except as noted in HPI or Assessment and Plan.  Objective  Well appearing patient in no apparent distress; mood and affect are within normal limits.  A full examination was performed including scalp, head, eyes, ears, nose, lips, neck, chest, axillae, abdomen, back, buttocks, bilateral upper extremities, bilateral lower extremities, hands, feet, fingers, toes, fingernails, and toenails. All findings within normal limits unless otherwise noted below.   Relevant physical exam findings are noted in the Assessment and Plan.       Assessment & Plan   SKIN CANCER SCREENING PERFORMED TODAY.  ACTINIC DAMAGE - Chronic condition, secondary to cumulative UV/sun exposure - diffuse scaly erythematous macules with underlying dyspigmentation - Recommend daily broad spectrum sunscreen SPF 30+ to sun-exposed areas, reapply every 2 hours as needed.  - Staying in the shade or wearing long sleeves, sun glasses (UVA+UVB protection) and wide brim hats (4-inch brim around the entire circumference of the hat) are also recommended for sun protection.  - Call for new or changing lesions.  LENTIGINES, SEBORRHEIC KERATOSES, HEMANGIOMAS - Benign normal skin lesions - Benign-appearing - Call for any changes  MELANOCYTIC NEVI - Tan-brown and/or pink-flesh-colored symmetric macules and papules - Benign appearing  on exam today - Observation - Call clinic for new or changing moles - Recommend daily use of broad spectrum spf 30+ sunscreen to sun-exposed areas.   Angiofibroma/Fibrous Papule nose - 1-2 mm smooth symmetric flesh colored to pink papule(s) without features suspicious for malignancy on dermoscopy Photo taken  - Benign-appearing.  Observation.  Call clinic for new or changing lesions.    MELASMA Exam: reticulated hyperpigmented patches at face/around mouth  Photo taken   Melasma is a chronic; persistent condition of hyperpigmented patches generally on the face, worse in summer due to higher UV exposure.    Heredity; thyroid disease; sun exposure; pregnancy; birth control pills; epilepsy medication and darker skin may predispose to Melasma.   Recommendations include: - Sun avoidance and daily broad spectrum (UVA/UVB) tinted mineral sunscreen SPF 30+, with Zinc or Titanium Dioxide. - Rx topical bleaching creams (i.e. hydroquinone) is a common treatment but should not be used long term.  Hydroquinones may be mixed with retinoids; vitamin C; steroids; Kojic Acid. - Alastin A-luminate, retinoids, vitamin C, topical tranexamic acid, glycolic acid and kojic acid can be used for brightening while on break from hydroquinone - Rx Azelaic Acid is also a treatment option that is safe for pregnancy (Category B). - OTC Heliocare can be helpful in control and prevention. - Oral Rx with Tranexamic Acid 250 mg - 650 mg po daily can be used for moderate to severe cases especially during summer (contraindications include pregnancy; lactation; hx of PE; hx of DVT; clotting disorder; heart disease; anticoagulant use and upcoming long trips)   - Chemical peels (would need multiple for best result).  - Lasers and  Microdermabrasion may also be helpful  adjunct treatments.  Treatment Plan: Patient decline treatment today     HISTORY OF DYSPLASTIC NEVUS Right infra axillary 2018 No evidence of recurrence  today Recommend regular full body skin exams Recommend daily broad spectrum sunscreen SPF 30+ to sun-exposed areas, reapply every 2 hours as needed.  Call if any new or changing lesions are noted between office visits   Actinic skin damage  Lentigo  Melanocytic nevus, unspecified location  History of dysplastic nevus  Skin cancer screening  Medication management  Counseling and coordination of care  Melasma  Fibrous papule of nose   Return in about 1 year (around 09/29/2023) for TBSE, hx of Dysplastic nevus .  IAngelique Holm, CMA, am acting as scribe for Armida Sans, MD .   Documentation: I have reviewed the above documentation for accuracy and completeness, and I agree with the above.  Armida Sans, MD

## 2022-09-29 NOTE — Patient Instructions (Addendum)
Melasma is a chronic; persistent condition of hyperpigmented patches generally on the face, worse in summer due to higher UV exposure.    Heredity; thyroid disease; sun exposure; pregnancy; birth control pills; epilepsy medication and darker skin may predispose to Melasma.   Recommendations include: - Sun avoidance and daily broad spectrum (UVA/UVB) tinted mineral sunscreen SPF 30+, with Zinc or Titanium Dioxide. - Rx topical bleaching creams (i.e. hydroquinone) is a common treatment but should not be used long term.  Hydroquinones may be mixed with retinoids; vitamin C; steroids; Kojic Acid. - Alastin A-luminate, retinoids, vitamin C, topical tranexamic acid, glycolic acid and kojic acid can be used for brightening while on break from hydroquinone - Rx Azelaic Acid is also a treatment option that is safe for pregnancy (Category B). - OTC Heliocare can be helpful in control and prevention. - Oral Rx with Tranexamic Acid 250 mg - 650 mg po daily can be used for moderate to severe cases especially during summer (contraindications include pregnancy; lactation; hx of PE; hx of DVT; clotting disorder; heart disease; anticoagulant use and upcoming long trips)   - Chemical peels (would need multiple for best result).  - Lasers and  Microdermabrasion may also be helpful adjunct treatments.     Due to recent changes in healthcare laws, you may see results of your pathology and/or laboratory studies on MyChart before the doctors have had a chance to review them. We understand that in some cases there may be results that are confusing or concerning to you. Please understand that not all results are received at the same time and often the doctors may need to interpret multiple results in order to provide you with the best plan of care or course of treatment. Therefore, we ask that you please give [place] 2 business days to thoroughly review all your results before contacting the office for clarification. Should we see  a critical lab result, you will be contacted sooner.   If You Need Anything After Your Visit  If you have any questions or concerns for your doctor, please call our main line at [phone] and press option 4 to reach your doctor's medical assistant. If no one answers, please leave a voicemail as directed and we will return your call as soon as possible. Messages left after 4 pm will be answered the following business day.   You may also send [place] a message via MyChart. We typically respond to MyChart messages within 1-2 business days.  For prescription refills, please ask your pharmacy to contact our office. Our fax number is [phone].  If you have an urgent issue when the clinic is closed that cannot wait until the next business day, you can page your doctor at the number below.    Please note that while we do our best to be available for urgent issues outside of office hours, we are not available 24/7.   If you have an urgent issue and are unable to reach [place], you may choose to seek medical care at your doctor's office, retail clinic, urgent care center, or emergency room.  If you have a medical emergency, please immediately call 911 or go to the emergency department.  Pager Numbers  - Dr. Gwen Pounds: [phone]  - Dr. Roseanne Reno: [phone]  - Dr. Katrinka Blazing: [phone]   In the event of inclement weather, please call our main line at [phone] for an update on the status of any delays or closures.  Dermatology Medication Tips: Please keep the boxes that topical  medications come in in order to help keep track of the instructions about where and how to use these. Pharmacies typically print the medication instructions only on the boxes and not directly on the medication tubes.   If your medication is too expensive, please contact our office at [phone] option 4 or send [place] a message through MyChart.   We are unable to tell what your co-pay for medications will be in advance as  this is different depending on your insurance coverage. However, we may be able to find a substitute medication at lower cost or fill out paperwork to get insurance to cover a needed medication.   If a prior authorization is required to get your medication covered by your insurance company, please allow [place] 1-2 business days to complete this process.  Drug prices often vary depending on where the prescription is filled and some pharmacies may offer cheaper prices.  The website www.goodrx.com contains coupons for medications through different pharmacies. The prices here do not account for what the cost may be with help from insurance (it may be cheaper with your insurance), but the website can give you the price if you did not use any insurance.  - You can print the associated coupon and take it with your prescription to the pharmacy.  - You may also stop by our office during regular business hours and pick up a GoodRx coupon card.  - If you need your prescription sent electronically to a different pharmacy, notify our office through [facility] or by phone at [phone] option 4.     Si Usted Necesita Algo Despus de Su Visita  Tambin puede enviarnos un mensaje a travs de Clinical cytogeneticist. Por lo general respondemos a los mensajes de MyChart en el transcurso de 1 a 2 das hbiles.  Para renovar recetas, por favor pida a su farmacia que se ponga en contacto con nuestra oficina. Annie Sable de fax es [place] [phone].  Si tiene un asunto urgente cuando la clnica est cerrada y que no puede esperar hasta el siguiente da hbil, puede llamar/localizar a su doctor(a) al nmero que aparece a continuacin.   Por favor, tenga en cuenta que aunque hacemos todo lo posible para estar disponibles para asuntos urgentes fuera del horario de [place], no estamos disponibles las 24 horas del da, los 7 [address]  [address] de la [place].   Si tiene un problema urgente y no puede comunicarse con nosotros, puede optar por  buscar atencin mdica  en el consultorio de su doctor(a), en una clnica privada, en un centro de atencin urgente o en una sala de emergencias.  Si tiene Engineer, drilling, por favor llame inmediatamente al 911 o vaya a la sala de emergencias.  Nmeros de bper  - Dr. Gwen Pounds: [phone]  - Dra. Roseanne Reno: [phone]  - Dr. Katrinka Blazing: [phone]   En caso de inclemencias del tiempo, por favor llame a Lacy Duverney principal al [phone] para una actualizacin sobre el [place] de cualquier retraso o cierre.  Consejos para la medicacin en dermatologa: Por favor, guarde las cajas en las que vienen los medicamentos de uso tpico para ayudarle a seguir las instrucciones sobre dnde y cmo usarlos. Las farmacias generalmente imprimen las instrucciones del medicamento slo en las cajas y no directamente en los tubos del [place].   Si su medicamento es muy caro, por favor, pngase en contacto con Rolm [place]ala llamando al [phone] y presione la opcin 4 o envenos un mensaje a travs de Clinical cytogeneticist.   No  podemos decirle cul ser su copago por los medicamentos por adelantado ya que esto es diferente dependiendo de la cobertura de su seguro. Sin embargo, es posible que podamos encontrar un medicamento sustituto a Audiological scientist un formulario para que el seguro cubra el medicamento que se considera necesario.   Si se requiere una autorizacin previa para que su compaa de seguros [place] su medicamento, por favor permtanos de 1 a 2 das hbiles para completar [address]  Los precios de los medicamentos varan con frecuencia dependiendo del Environmental consultant de dnde se surte la receta y alguna farmacias pueden ofrecer precios ms baratos.  El sitio web www.goodrx.com tiene cupones para medicamentos de Health and safety inspector. Los precios aqu no tienen en cuenta lo que podra costar con la ayuda del seguro (puede ser ms barato con su seguro), pero el sitio web puede darle el precio si no  utiliz Tourist information centre manager.  - Puede imprimir el cupn correspondiente y llevarlo con su receta a la farmacia.  - Tambin puede pasar por nuestra oficina durante el horario de atencin regular y Education officer, museum una tarjeta de cupones de GoodRx.  - Si necesita que su receta se enve electrnicamente a una farmacia diferente, informe a nuestra oficina a travs de MyChart de  o por telfono llamando al [phone] y presione la opcin 4.

## 2022-10-03 ENCOUNTER — Other Ambulatory Visit: Payer: Self-pay

## 2022-10-03 ENCOUNTER — Telehealth: Payer: Self-pay

## 2022-10-03 MED ORDER — HYDROQUINONE POWD: 2 refills | Status: DC

## 2022-10-03 NOTE — Telephone Encounter (Signed)
error 

## 2022-10-07 ENCOUNTER — Encounter: Payer: Self-pay | Admitting: Dermatology

## 2022-10-11 ENCOUNTER — Ambulatory Visit
Admission: RE | Admit: 2022-10-11 | Discharge: 2022-10-11 | Disposition: A | Discharge status: Home / Self Care | Payer: Managed Care, Other (non HMO) | Source: Ambulatory Visit | Attending: Gastroenterology | Admitting: Gastroenterology

## 2022-10-11 DIAGNOSIS — Z800 Family history of malignant neoplasm of digestive organs: Secondary | ICD-10-CM

## 2022-10-11 DIAGNOSIS — Z1509 Genetic susceptibility to other malignant neoplasm: Secondary | ICD-10-CM

## 2022-10-11 MED ORDER — GADOPICLENOL 0.5 MMOL/ML IV SOLN
6.0000 mL | Freq: Once | INTRAVENOUS | Status: AC | PRN
Start: 2022-10-11 — End: 2022-10-11
  Administered 2022-10-11: 6 mL via INTRAVENOUS

## 2022-10-19 NOTE — Progress Notes (Signed)
GYNECOLOGIC ONCOLOGY NEW PATIENT CONSULTATION   Patient Name: Marissa Salazar  Patient Age: 42 y.o. Date of Service: 10/20/22 Referring Provider: Doristine Locks, MD  Primary Care Provider: Melida Quitter, MD Consulting Provider: Eugene Garnet, MD   Assessment/Plan:  Premenopausal patient with cyclic pelvic pain, recurrent vs persistent left ovarian cyst, and  ATM mutation.   We spent some time reviewing her cyclic symptoms which include pain and bloating. Many of her symptoms sound premenstrual, but she has them at two different times during the month (one of which she thinks is likely around ovulation). Possible etiologies of her pain include premenstrual disorder or other disorder related to cyclic hormonal changes, endometriosis, left ovarian cyst, and non gynecologic etiologies. She has not had a pelvic ultrasound in nearly 7 months. I recommended repeating a pelvic ultrasound to assess ovaries and presence of any adnexal masses. We also discussed the possibility of starting something for hormonal modulation or suppression. We discussed completed suppression with a medication like Lupron or trying an oral low-dose progesterone agent that would give [place] diagnostic capacity without side effects Lupron would like produce. Patient was amenable to starting oral progesterone. Prescription for norethindrone was sent to her pharmacy.    She endorses hot flashes during these cyclic symptoms. Hormonal testing over a year ago likely indicated premenopausal status. We discussed repeating hormonal testing today.   We also discussed her ATM mutation and the small but increased risk of ovarian cancer. There is not sufficient evidence to recommend risk-reducing surgery with BSO in the setting of an ATM mutation, but this can be considered based on a patient's family history. If we were to discuss surgery (for left ovarian mass, to rule out endometriosis, etc), would be reasonable to consider removal of both  ovaries.   Given no personal history of breast cancer, we discussed likely negligible risk to her being on progesterone.  I will contact the patient once we have her updated hormonal testing as well as ultrasound.   A copy of this note was sent to the patient's referring provider.   60 minutes of total time was spent for this patient encounter, including preparation, face-to-face counseling with the patient and coordination of care, and documentation of the encounter.  Eugene Garnet, MD  Division of Gynecologic Oncology  Department of Obstetrics and Gynecology  [facility]  ___________________________________________  Chief Complaint: Chief Complaint  Patient presents with   ATM gene mutation positive    History of Present Illness:  Marissa Salazar is a 42 y.o. y.o. female who is seen in consultation at the request of Dr. Barron Alvine for an evaluation of ovarian cyst, cyclic pelvic pain and associated symptoms.  The patient underwent hysterectomy in 10/2019 secondary to abnormal uterine bleeding and iron deficiency anemia requiring multiple blood transfusions.  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  Benign leiomyoma, acute salpingitis of the left fallopian tube and unremarkable right fallopian tube.  After surgery, she developed cyclic symptoms that have persisted.  She underwent CT of the A/P in 11/2020 in the setting of LLQ abdominal pain after her hysterectomy in 10/2019.  This showed a 2.8 cm simple left ovarian cyst.  Incidental note of a few small low-attenuation hepatic lesions.  Pelvic ultrasound in 12/2021 at [facility] showed a normal right ovary, simple appearing cyst without blood flow within the left ovary measuring up to 3.3 cm. No free fluid.   She  had follow-up with Dr. Rana Snare in February of this year.  At that time, pelvic ultrasound was performed  and showed a normal-appearing right ovary.  Left ovary measured up to 4.1 cm and had a 2.1 x 1.9 cm complex cyst thought to possibly be a hemorrhagic cyst.  No free fluid noted.  She describes that every month, she has multiple symptoms including abdominal pain, bloating, insomnia, fatigue, and hot flashes.  She has the symptoms during the entire month, there seems to be worsening of symptoms around the time of ovulation and what would be her menstrual cycle.  For 2 weeks of the month that she has worsened symptoms.  She describes the pain as a radiating vibration or a pulling/burning.  At its worst, she rates it at a 9 out of 10 on a 10 point point scale and describes it as constant.  She uses heating pads and over-the-counter medications for the pain.  She was previously trialed on Loestrin although this worsened her symptoms.  She has had a discussion about Depo-Provera with her OB/GYN but deferred trial of this.  She endorses a good appetite, has worked on changing her diet recently.  Has had some more recent weight gain.  Exercises regularly including running.  Notes longstanding history of alternating diarrhea and constipation, no recent change to her bowel function.  Endorses some urinary frequency that worsened after her hysterectomy.  She developed an SVT while on combination OCPs prior to her hysterectomy.   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 This was performed in the setting of her family history of breast, colon, and pancreatic cancer.   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   PAST MEDICAL HISTORY:  Past Medical History:  Diagnosis Date   Abnormal Pap smear    in past; normal colpo   Allergic rhinitis    Allergy    Anemia    Hx fibroids   Blood transfusion without reported diagnosis 05/2019   Dysplastic nevus 10/05/2016   R infra axillary, mild atypia   Family history of breast cancer     Family history of colon cancer    Family history of pancreatic cancer    Fibroid    Headache    Iron deficiency anemia due to chronic blood loss    Lab test positive for detection of COVID-19 virus 10/05/2019   Menorrhagia 03/29/2018   Monoallelic mutation of ATM gene    Superficial vein thrombosis 2019   Left leg, while on OCP   Uterine fibroid 05/09/2019   sonohysterogram showing 7.8 x 7.4 cm fibroid with 4.6 cm in endometrial cavity - Dr. Rana Snare     PAST SURGICAL HISTORY:  Past Surgical History:  Procedure Laterality Date   ABDOMINAL HYSTERECTOMY  10/30/2019   bilateral salpingectomy - See pathology in Epic:  endometritis, fibroids, salpingitis,.LGSIL of cervix   BREAST BIOPSY Left 06/12/2014   fibroadenoma   BUNIONECTOMY Right 2013   COLPOSCOPY  2009   ESOPHAGOGASTRODUODENOSCOPY N/A 07/17/2015   Procedure: ESOPHAGOGASTRODUODENOSCOPY (EGD);  Surgeon: Scot Jun, MD;  Location: [facility] ENDOSCOPY;  Service: Endoscopy;  Laterality: N/A;    OB/GYN HISTORY:  OB History  Gravida Para Term Preterm AB Living  2 2 2  0 0 2  SAB IAB Ectopic Multiple Live Births  0 0 0 0 1    # Outcome Date GA Lbr Len/2nd Weight Sex Type Anes PTL Lv  2 Term 08/22/10 [redacted]w[redacted]d  03:02 / 00:12 6 lb 7.4 oz (2.931 kg) M Vag-Spont EPI  LIV     Birth Comments: small skin tag next to R nipple  1 Term             Obstetric Comments  1st Menstrual Cycle: 13   1st Pregnancy:  24    No LMP recorded (lmp unknown). Patient has had a hysterectomy.  Age at menarche: 37  Age at menopause: n/a Hx of STDs: denies Last pap: 2023 History of abnormal pap smears: yes, prior to hysterectomy  SCREENING STUDIES:  Last mammogram: 2024  Last colonoscopy: 2022  MEDICATIONS: Outpatient Encounter Medications as of 10/20/2022  Medication Sig   cholecalciferol (VITAMIN D3) 25 MCG (1000 UNIT) tablet Take 1,000 Units by mouth daily.   EYSUVIS 0.25 % SUSP Place 1 drop into both eyes as needed.   Multiple Vitamin  (MULTIVITAMIN) tablet Take 1 tablet by mouth daily.   norethindrone (ORTHO MICRONOR) 0.35 MG tablet Take 1 tablet (0.35 mg total) by mouth daily.   omeprazole (PRILOSEC) 40 MG capsule Take 40 mg by mouth as needed.   [DISCONTINUED] Hydroquinone POWD Apply to face at bedtime for 3 months then stop for 3 months   No facility-administered encounter medications on file as of 10/20/2022.    ALLERGIES:  Allergies  Allergen Reactions   Ivp Dye [Iodinated Contrast Media] Hives     FAMILY HISTORY:  Family History  Problem Relation Age of Onset   Multiple myeloma Father    Breast cancer Maternal Aunt 60       BRCA negative   Pancreatic cancer Maternal Aunt 44   Heart attack Maternal Uncle    Diabetes Paternal Uncle    Colon cancer Maternal Grandmother 50   Breast cancer Maternal Grandmother 54   Hypertension Maternal Grandmother    Breast cancer Other        GP   Lung cancer Other        GP   Esophageal cancer Neg Hx    Liver cancer Neg Hx    Stomach cancer Neg Hx      SOCIAL HISTORY:  Social Connections: Unknown (01/25/2022)   Received from [facility], [facility]   Social Network    Social Network: Not on file    REVIEW OF SYSTEMS:  + fatigue, bloating, abdominal pain, constipation, urinary frequency, hot flashes Denies appetite changes, fevers, chills, unexplained weight changes. Denies hearing loss, neck lumps or masses, mouth sores, ringing in ears or voice changes. Denies cough or wheezing.  Denies shortness of breath. Denies chest pain or palpitations. Denies leg swelling. Denies blood in stools, nausea, vomiting, or early satiety. Denies pain with intercourse, dysuria, hematuria or incontinence. Denies pelvic pain, vaginal bleeding or vaginal discharge.   Denies joint pain, back pain or muscle pain/cramps. Denies itching, rash, or wounds. Denies dizziness, headaches, numbness or seizures. Denies swollen lymph nodes or glands, denies easy bruising or  bleeding. Denies anxiety, depression, confusion, or decreased concentration.  Physical Exam:  Vital Signs for this encounter:  Blood pressure 111/71, pulse (!) 57, temperature 98.6 F (37 C), temperature source Oral, resp. rate 16, height 5\' 4"  (1.626 m), weight 132 lb 9.6 oz (60.1 kg), SpO2 100%. Body mass index is 22.76 kg/m. General: Alert, oriented, no acute distress.  HEENT: Normocephalic, atraumatic. Sclera anicteric.  Chest: Clear to auscultation bilaterally. No wheezes, rhonchi, or rales. Cardiovascular: Regular rate and rhythm, no murmurs, rubs, or gallops.  Abdomen: Normoactive bowel sounds. Soft, nondistended, nontender  to palpation. No masses or hepatosplenomegaly appreciated. No palpable fluid wave.  Extremities: Grossly normal range of motion. Warm, well perfused. No edema bilaterally.  Skin: No rashes or lesions.  Lymphatics: No cervical, supraclavicular, or inguinal adenopathy.  GU:  Normal external female genitalia. No lesions. No discharge or bleeding.             Bladder/urethra:  No lesions or masses, well supported bladder             Vagina: Well rugated, no lesions.             Cervix/uterus: surgically absent.             Adnexa: Some fullness within the left adnexa that I suspect is redundant colon.  No nodularity or firmness.  No tenderness to palpation.  Rectal: Deferred.  LABORATORY AND RADIOLOGIC DATA:  Outside medical records were reviewed to synthesize the above history, along with the history and physical obtained during the visit.   Lab Results  Component Value Date   WBC 6.9 05/03/2021   HGB 14.0 05/03/2021   HCT 43.5 05/03/2021   PLT 318 05/03/2021   GLUCOSE 84 05/03/2021   CHOL 213 (H) 05/06/2021   TRIG 52 05/06/2021   HDL 115 05/06/2021   LDLDIRECT 59 11/28/2008   LDLCALC 89 05/06/2021   ALT 20 05/03/2021   AST 28 05/03/2021   NA 139 05/03/2021   K 4.9 05/03/2021   CL 103 05/03/2021   CREATININE 0.88 05/03/2021   BUN 25 (H) 05/03/2021    CO2 20 05/03/2021   TSH 1.080 05/03/2021   HGBA1C 4.9 09/19/2022

## 2022-10-20 ENCOUNTER — Inpatient Hospital Stay: Payer: Managed Care, Other (non HMO) | Attending: Gynecologic Oncology | Admitting: Gynecologic Oncology

## 2022-10-20 ENCOUNTER — Encounter: Payer: Self-pay | Admitting: Gynecologic Oncology

## 2022-10-20 ENCOUNTER — Inpatient Hospital Stay: Payer: Managed Care, Other (non HMO)

## 2022-10-20 VITALS — BP 111/71 | HR 57 | Temp 98.6°F | Resp 16 | Ht 64.0 in | Wt 132.6 lb

## 2022-10-20 DIAGNOSIS — Z1502 Genetic susceptibility to malignant neoplasm of ovary: Secondary | ICD-10-CM | POA: Diagnosis not present

## 2022-10-20 DIAGNOSIS — N83202 Unspecified ovarian cyst, left side: Secondary | ICD-10-CM | POA: Diagnosis not present

## 2022-10-20 DIAGNOSIS — Z1509 Genetic susceptibility to other malignant neoplasm: Secondary | ICD-10-CM | POA: Diagnosis not present

## 2022-10-20 DIAGNOSIS — Z803 Family history of malignant neoplasm of breast: Secondary | ICD-10-CM | POA: Diagnosis not present

## 2022-10-20 DIAGNOSIS — R350 Frequency of micturition: Secondary | ICD-10-CM | POA: Diagnosis not present

## 2022-10-20 DIAGNOSIS — Z801 Family history of malignant neoplasm of trachea, bronchus and lung: Secondary | ICD-10-CM | POA: Diagnosis not present

## 2022-10-20 DIAGNOSIS — Z807 Family history of other malignant neoplasms of lymphoid, hematopoietic and related tissues: Secondary | ICD-10-CM | POA: Diagnosis not present

## 2022-10-20 DIAGNOSIS — J309 Allergic rhinitis, unspecified: Secondary | ICD-10-CM | POA: Diagnosis not present

## 2022-10-20 DIAGNOSIS — Z8616 Personal history of COVID-19: Secondary | ICD-10-CM | POA: Diagnosis not present

## 2022-10-20 DIAGNOSIS — R102 Pelvic and perineal pain: Secondary | ICD-10-CM

## 2022-10-20 DIAGNOSIS — Z90710 Acquired absence of both cervix and uterus: Secondary | ICD-10-CM | POA: Insufficient documentation

## 2022-10-20 DIAGNOSIS — Z800 Family history of malignant neoplasm of digestive organs: Secondary | ICD-10-CM | POA: Diagnosis not present

## 2022-10-20 DIAGNOSIS — Z148 Genetic carrier of other disease: Secondary | ICD-10-CM | POA: Diagnosis not present

## 2022-10-20 DIAGNOSIS — Z1501 Genetic susceptibility to malignant neoplasm of breast: Secondary | ICD-10-CM | POA: Diagnosis not present

## 2022-10-20 MED ORDER — NORETHINDRONE 0.35 MG PO TABS: 1.0000 | ORAL_TABLET | Freq: Every day | ORAL | 11 refills | Status: DC

## 2022-10-20 NOTE — Patient Instructions (Signed)
It was very nice to meet you today.  I am sending in a prescription for the low-dose progesterone only pills that we had discussed.  Please let me know if you have any symptoms within the first few weeks after starting these.  Given 42-month interval since your last pelvic ultrasound, we discussed repeating an ultrasound in the near future.  I will call you with these results.  My hope is that having you on some low-dose progesterone will help to answer the question of whether your cyclic symptoms are related to hormonal fluctuations/changes.  We will check in regarding your symptoms in approximately 3 months by phone.

## 2022-10-21 ENCOUNTER — Other Ambulatory Visit (HOSPITAL_COMMUNITY): Payer: Managed Care, Other (non HMO)

## 2022-10-21 LAB — ESTRADIOL: Estradiol: 47.6 pg/mL

## 2022-10-21 LAB — FOLLICLE STIMULATING HORMONE: FSH: 8.8 m[IU]/mL

## 2022-10-21 LAB — LUTEINIZING HORMONE: LH: 8.5 m[IU]/mL

## 2022-10-22 ENCOUNTER — Encounter: Payer: Self-pay | Admitting: Hematology and Oncology

## 2022-10-24 ENCOUNTER — Encounter: Payer: Self-pay | Admitting: Gynecologic Oncology

## 2022-10-24 ENCOUNTER — Telehealth: Payer: Self-pay | Admitting: *Deleted

## 2022-10-24 NOTE — Telephone Encounter (Signed)
Per patient request scheduled [place] at [facility] Patient aware

## 2022-10-25 ENCOUNTER — Ambulatory Visit (HOSPITAL_COMMUNITY): Payer: Managed Care, Other (non HMO)

## 2022-10-26 ENCOUNTER — Other Ambulatory Visit: Payer: Managed Care, Other (non HMO)

## 2022-11-01 ENCOUNTER — Encounter: Payer: Self-pay | Admitting: Gynecologic Oncology

## 2022-11-02 ENCOUNTER — Encounter: Payer: Self-pay | Admitting: Dermatology

## 2022-11-02 DIAGNOSIS — L988 Other specified disorders of the skin and subcutaneous tissue: Secondary | ICD-10-CM

## 2022-11-03 ENCOUNTER — Ambulatory Visit
Admission: RE | Admit: 2022-11-03 | Discharge: 2022-11-03 | Disposition: A | Discharge status: Home / Self Care | Payer: Managed Care, Other (non HMO) | Source: Ambulatory Visit | Attending: Gynecologic Oncology | Admitting: Gynecologic Oncology

## 2022-11-03 DIAGNOSIS — N83202 Unspecified ovarian cyst, left side: Secondary | ICD-10-CM

## 2022-11-03 MED ORDER — TRETINOIN 0.025 % EX CREA: TOPICAL_CREAM | CUTANEOUS | 6 refills | Status: DC

## 2022-11-04 ENCOUNTER — Telehealth: Payer: Self-pay | Admitting: Gynecologic Oncology

## 2022-11-04 DIAGNOSIS — N949 Unspecified condition associated with female genital organs and menstrual cycle: Secondary | ICD-10-CM

## 2022-11-04 NOTE — Telephone Encounter (Signed)
Called patient.  Discussed recent scan.  Recommend starting norethindrone for 2-54-month trial to see if this helps with symptoms.  Will plan to repeat ultrasound in 8-10 weeks.  We have a phone visit in mid December.  If improvement on progesterone, low-dose, we will discuss continued low-dose progesterone use versus consideration of diagnostic/therapeutic surgery.  Eugene Garnet MD Gynecologic Oncology

## 2022-11-04 NOTE — Telephone Encounter (Signed)
Called the patient to discuss recent ultrasound.  No answer.  Message left requesting callback.  Eugene Garnet MD Gynecologic Oncology

## 2022-11-24 ENCOUNTER — Encounter: Payer: Self-pay | Admitting: Obstetrics and Gynecology

## 2023-01-04 ENCOUNTER — Ambulatory Visit (HOSPITAL_BASED_OUTPATIENT_CLINIC_OR_DEPARTMENT_OTHER)
Admission: RE | Admit: 2023-01-04 | Discharge: 2023-01-04 | Disposition: A | Discharge status: Home / Self Care | Payer: Managed Care, Other (non HMO) | Source: Ambulatory Visit | Attending: Gynecologic Oncology | Admitting: Gynecologic Oncology

## 2023-01-04 ENCOUNTER — Ambulatory Visit (HOSPITAL_COMMUNITY): Admission: RE | Admit: 2023-01-04 | Payer: Managed Care, Other (non HMO) | Source: Ambulatory Visit

## 2023-01-04 DIAGNOSIS — N949 Unspecified condition associated with female genital organs and menstrual cycle: Secondary | ICD-10-CM | POA: Diagnosis present

## 2023-01-10 ENCOUNTER — Telehealth: Payer: Self-pay | Admitting: Gynecologic Oncology

## 2023-01-10 DIAGNOSIS — N949 Unspecified condition associated with female genital organs and menstrual cycle: Secondary | ICD-10-CM

## 2023-01-10 NOTE — Telephone Encounter (Signed)
Called the patient - discussed recent ultrasound. Overall, very reassuring. Discussed decreased size of right ovarian cyst. Her symptoms have been significantly better in the last 3 months and she opted not to start the progesterone only pills. Discussed option of additional ultrasound in 3-6 months and if this is reassuring and she remains relatively asymptomatic, that we can stop getting imaging unless something were to change with her symptoms.  Order for repeat ultrasound placed and message sent to the office to schedule her for follow-up US in 3-6 months.  Eugene Garnet MD Gynecologic Oncology

## 2023-01-11 ENCOUNTER — Encounter: Payer: Self-pay | Admitting: Hematology and Oncology

## 2023-01-19 ENCOUNTER — Telehealth: Payer: Managed Care, Other (non HMO) | Admitting: Gynecologic Oncology

## 2023-01-30 ENCOUNTER — Other Ambulatory Visit: Payer: Managed Care, Other (non HMO)

## 2023-02-12 ENCOUNTER — Inpatient Hospital Stay: Admission: RE | Admit: 2023-02-12 | Payer: Managed Care, Other (non HMO) | Source: Ambulatory Visit

## 2023-02-12 ENCOUNTER — Other Ambulatory Visit: Payer: Managed Care, Other (non HMO)

## 2023-03-19 ENCOUNTER — Ambulatory Visit
Admission: RE | Admit: 2023-03-19 | Discharge: 2023-03-19 | Disposition: A | Discharge status: Home / Self Care | Payer: Managed Care, Other (non HMO) | Source: Ambulatory Visit | Attending: Obstetrics and Gynecology | Admitting: Obstetrics and Gynecology

## 2023-03-19 DIAGNOSIS — Z1239 Encounter for other screening for malignant neoplasm of breast: Secondary | ICD-10-CM

## 2023-03-19 MED ORDER — GADOPICLENOL 0.5 MMOL/ML IV SOLN
6.0000 mL | Freq: Once | INTRAVENOUS | Status: AC | PRN
Start: 2023-03-19 — End: 2023-03-19
  Administered 2023-03-19: 6 mL via INTRAVENOUS

## 2023-03-29 ENCOUNTER — Encounter: Payer: Self-pay | Admitting: Genetic Counselor

## 2023-03-30 ENCOUNTER — Encounter: Payer: Self-pay | Admitting: Gynecologic Oncology

## 2023-03-30 ENCOUNTER — Telehealth: Payer: Self-pay | Admitting: Gynecologic Oncology

## 2023-03-30 NOTE — Telephone Encounter (Signed)
I called and spoke with the patient about her MyChart message.  We discussed her recent symptoms.  She has been quite symptomatic over the last 2 months with insomnia, night sweats.  Both are happening cyclically.  Discussed options again in terms of treatment and trying to tease out what role her hormones are playing in the symptoms.  Although hormones were consistent with premenopausal status back in September, she certainly could be having some hormone fluctuations that are causing menopausal symptoms.  Discussed the option of starting low-dose progesterone with norethindrone.  She never started this after our initial visit.  We also discussed low estrogen combination birth control.  Given her history of SVT, my vote would be that we start with low-dose progesterone to see if this provides some symptom relief.  She will let me know if she needs a new prescription to be sent.  I have asked her to reach out in 6-8 weeks to let me know how her symptoms are.  While there is some inherent risk to putting her on a combination low-dose birth control pill, given the impact on her quality of life recently, I think that the potential benefits outweigh the risks.  We will consider this if oral progesterone does not help with symptoms.  Eugene Garnet MD Gynecologic Oncology

## 2023-03-30 NOTE — Telephone Encounter (Signed)
Thank you - called and spoke with the patient

## 2023-05-25 ENCOUNTER — Encounter: Payer: Self-pay | Admitting: Gynecologic Oncology

## 2023-05-29 ENCOUNTER — Ambulatory Visit (HOSPITAL_BASED_OUTPATIENT_CLINIC_OR_DEPARTMENT_OTHER): Payer: Managed Care, Other (non HMO)

## 2023-06-01 ENCOUNTER — Ambulatory Visit
Admission: RE | Admit: 2023-06-01 | Discharge: 2023-06-01 | Discharge status: Home / Self Care | Source: Ambulatory Visit | Attending: Gynecologic Oncology

## 2023-06-01 DIAGNOSIS — N949 Unspecified condition associated with female genital organs and menstrual cycle: Secondary | ICD-10-CM

## 2023-06-06 ENCOUNTER — Encounter: Payer: Self-pay | Admitting: Gynecologic Oncology

## 2023-06-07 ENCOUNTER — Telehealth: Payer: Self-pay | Admitting: Gynecologic Oncology

## 2023-06-07 NOTE — Telephone Encounter (Signed)
 Called the patient regarding recent ultrasound as well as her MyChart message.  Ultrasound shows simple follicular cyst versus corpus luteal cyst in the left ovary, normal-appearing right ovary.  The patient overall reports improvement in her symptoms.  She notes significantly less hot flashes/night sweats, sleeping better.  Has had some bloating and left-sided abdominal pain the last 2 cycles though.  Discussed options moving forward.  I recommended we do another 2-3 months trial with the current low-dose progesterone.  If at that point, she remains symptomatic, we discussed potentially doubling the dose of norethindrone .  It is possible, that she is still ovulating on the low-dose norethindrone .  I asked her to reach out by MyChart in late June or early July to let me know how she is feeling.  Wiley Hanger MD Gynecologic Oncology

## 2023-06-15 ENCOUNTER — Other Ambulatory Visit: Payer: Self-pay | Admitting: Obstetrics and Gynecology

## 2023-06-15 DIAGNOSIS — Z1231 Encounter for screening mammogram for malignant neoplasm of breast: Secondary | ICD-10-CM

## 2023-07-25 ENCOUNTER — Encounter: Payer: Self-pay | Admitting: Gynecologic Oncology

## 2023-07-25 ENCOUNTER — Other Ambulatory Visit: Payer: Self-pay | Admitting: Gynecologic Oncology

## 2023-07-25 DIAGNOSIS — R102 Pelvic and perineal pain: Secondary | ICD-10-CM

## 2023-07-25 MED ORDER — NORETHINDRONE 0.35 MG PO TABS: 1.0000 | ORAL_TABLET | Freq: Every day | ORAL | 11 refills | Status: AC

## 2023-07-31 ENCOUNTER — Ambulatory Visit
Admission: RE | Admit: 2023-07-31 | Discharge: 2023-07-31 | Disposition: A | Discharge status: Home / Self Care | Source: Ambulatory Visit | Attending: Obstetrics and Gynecology | Admitting: Obstetrics and Gynecology

## 2023-07-31 DIAGNOSIS — Z1231 Encounter for screening mammogram for malignant neoplasm of breast: Secondary | ICD-10-CM

## 2023-08-01 ENCOUNTER — Telehealth: Payer: Self-pay | Admitting: Gastroenterology

## 2023-08-01 NOTE — Telephone Encounter (Signed)
 I don't think so she can keep appt with Dr San to discuss EUS there are no openings right now until Sept/October.

## 2023-08-01 NOTE — Telephone Encounter (Signed)
 Inbound call from patient wanting to schedule EUS? Please advise.

## 2023-08-01 NOTE — Telephone Encounter (Signed)
MyChart message sent to patient with this information.

## 2023-09-18 ENCOUNTER — Encounter: Payer: Self-pay | Admitting: Gynecologic Oncology

## 2023-09-20 ENCOUNTER — Telehealth: Payer: Self-pay | Admitting: Gynecologic Oncology

## 2023-09-20 NOTE — Telephone Encounter (Signed)
 Called the patient to discuss her recent MyChart message.  Reviewed that overall symptoms have been improved including less hot flashes and better sleep.  She has been feeling more bloated than previously, seems to be independent of how much or what she eats although has not been keeping a food diary.  Offered several options for moving forward.  Discussed that we could get a pelvic ultrasound to look at both ovaries.  Her last ultrasound was very reassuring, but given her history of ovarian cysts, would be reasonable to look again at the adnexa.  I also discussed that she could try stopping the progesterone for 2-3 weeks to see if this improves her bloating symptoms.  This may mean that her other symptoms that have improved on progesterone will worsen again.  I also suggested that she read some about the FODMAP diet.  It may be that there is something in her diet that is causing her bloating and she may be able to identify it with an elimination diet.  She would like to talk to her fianc about the ultrasound and will let me know.  I asked her to keep me posted but suggested that she either stop the progesterone or pursue the FODMAP diet and not do both at the same time so that we know what is helping.  Comer Dollar MD Gynecologic Oncology

## 2023-10-04 ENCOUNTER — Telehealth: Payer: Self-pay | Admitting: Gastroenterology

## 2023-10-04 ENCOUNTER — Ambulatory Visit: Admitting: Gastroenterology

## 2023-10-04 NOTE — Telephone Encounter (Signed)
 Good Morning Dr. San,   Patient called stating that she would not be in for her appointment this morning at 10:40 due to having COVID and still not feeling well.   Patient states she will call back at a later time to reschedule.

## 2023-10-19 ENCOUNTER — Encounter: Payer: Self-pay | Admitting: Dermatology

## 2023-10-19 ENCOUNTER — Ambulatory Visit: Payer: Managed Care, Other (non HMO) | Admitting: Dermatology

## 2023-10-19 DIAGNOSIS — L814 Other melanin hyperpigmentation: Secondary | ICD-10-CM

## 2023-10-19 DIAGNOSIS — L821 Other seborrheic keratosis: Secondary | ICD-10-CM

## 2023-10-19 DIAGNOSIS — Z7189 Other specified counseling: Secondary | ICD-10-CM

## 2023-10-19 DIAGNOSIS — D2272 Melanocytic nevi of left lower limb, including hip: Secondary | ICD-10-CM

## 2023-10-19 DIAGNOSIS — L219 Seborrheic dermatitis, unspecified: Secondary | ICD-10-CM

## 2023-10-19 DIAGNOSIS — D229 Melanocytic nevi, unspecified: Secondary | ICD-10-CM

## 2023-10-19 DIAGNOSIS — Z1283 Encounter for screening for malignant neoplasm of skin: Secondary | ICD-10-CM | POA: Diagnosis not present

## 2023-10-19 DIAGNOSIS — Z79899 Other long term (current) drug therapy: Secondary | ICD-10-CM

## 2023-10-19 DIAGNOSIS — L578 Other skin changes due to chronic exposure to nonionizing radiation: Secondary | ICD-10-CM | POA: Diagnosis not present

## 2023-10-19 DIAGNOSIS — D2262 Melanocytic nevi of left upper limb, including shoulder: Secondary | ICD-10-CM

## 2023-10-19 DIAGNOSIS — W908XXA Exposure to other nonionizing radiation, initial encounter: Secondary | ICD-10-CM

## 2023-10-19 DIAGNOSIS — L811 Chloasma: Secondary | ICD-10-CM

## 2023-10-19 DIAGNOSIS — Z86018 Personal history of other benign neoplasm: Secondary | ICD-10-CM

## 2023-10-19 DIAGNOSIS — D225 Melanocytic nevi of trunk: Secondary | ICD-10-CM

## 2023-10-19 MED ORDER — KETOCONAZOLE 2 % EX SHAM: 1.0000 | MEDICATED_SHAMPOO | Freq: Once | CUTANEOUS | 11 refills | Status: AC

## 2023-10-19 MED ORDER — TRETINOIN 0.05 % EX CREA: TOPICAL_CREAM | Freq: Every day | CUTANEOUS | 11 refills | Status: AC

## 2023-10-19 NOTE — Patient Instructions (Addendum)
 Melasma is a chronic; persistent condition of hyperpigmented patches generally on the face, worse in summer due to higher UV exposure.    Heredity; thyroid  disease; sun exposure; pregnancy; birth control pills; epilepsy medication and darker skin may predispose to Melasma.   Recommendations include: - Sun avoidance and daily broad spectrum (UVA/UVB) tinted mineral sunscreen SPF 30+, with Zinc  or Titanium Dioxide. - Rx topical bleaching creams (i.e. hydroquinone ) is a common treatment but should not be used long term.  Hydroquinones may be mixed with retinoids; vitamin C; steroids; Kojic Acid. - Alastin A-luminate, retinoids, vitamin C, topical tranexamic acid , glycolic acid and kojic acid can be used for brightening while on break from hydroquinone  - Rx Azelaic Acid is also a treatment option that is safe for pregnancy (Category B). - OTC Heliocare can be helpful in control and prevention. - Oral Rx with Tranexamic Acid  250 mg - 650 mg po daily can be used for moderate to severe cases especially during summer (contraindications include pregnancy; lactation; hx of PE; hx of DVT; clotting disorder; heart disease; anticoagulant use and upcoming long trips)   - Chemical peels (would need multiple for best result).  - Lasers and  Microdermabrasion may also be helpful adjunct treatments.   Recommend daily broad spectrum (UVA/UVB) tinted mineral sunscreen SPF 30+, with Zinc  or Titanium Dioxide.   Melanoma ABCDEs  Melanoma is the most dangerous type of skin cancer, and is the leading cause of death from skin disease.  You are more likely to develop melanoma if you: Have light-colored skin, light-colored eyes, or red or blond hair Spend a lot of time in the sun Tan regularly, either outdoors or in a tanning bed Have had blistering sunburns, especially during childhood Have a close family member who has had a melanoma Have atypical moles or large birthmarks  Early detection of melanoma is key since  treatment is typically straightforward and cure rates are extremely high if we catch it early.   The first sign of melanoma is often a change in a mole or a new dark spot.  The ABCDE system is a way of remembering the signs of melanoma.  A for asymmetry:  The two halves do not match. B for border:  The edges of the growth are irregular. C for color:  A mixture of colors are present instead of an even brown color. D for diameter:  Melanomas are usually (but not always) greater than 6mm - the size of a pencil eraser. E for evolution:  The spot keeps changing in size, shape, and color.  Please check your skin once per month between visits. You can use a small mirror in front and a large mirror behind you to keep an eye on the back side or your body.   If you see any new or changing lesions before your next follow-up, please call to schedule a visit.  Please continue daily skin protection including broad spectrum sunscreen SPF 30+ to sun-exposed areas, reapplying every 2 hours as needed when you're outdoors.    Due to recent changes in healthcare laws, you may see results of your pathology and/or laboratory studies on MyChart before the doctors have had a chance to review them. We understand that in some cases there may be results that are confusing or concerning to you. Please understand that not all results are received at the same time and often the doctors may need to interpret multiple results in order to provide you with the best plan of care  or course of treatment. Therefore, we ask that you please give us  2 business days to thoroughly review all your results before contacting the office for clarification. Should we see a critical lab result, you will be contacted sooner.   If You Need Anything After Your Visit  If you have any questions or concerns for your doctor, please call our main line at [phone] and press option 4 to reach your doctor's medical assistant. If no one answers, please  leave a voicemail as directed and we will return your call as soon as possible. Messages left after 4 pm will be answered the following business day.   You may also send us  a message via MyChart. We typically respond to MyChart messages within 1-2 business days.  For prescription refills, please ask your pharmacy to contact our office. Our fax number is [phone].  If you have an urgent issue when the clinic is closed that cannot wait until the next business day, you can page your doctor at the number below.    Please note that while we do our best to be available for urgent issues outside of office hours, we are not available 24/7.   If you have an urgent issue and are unable to reach us , you may choose to seek medical care at your doctor's office, retail clinic, urgent care center, or emergency room.  If you have a medical emergency, please immediately call 911 or go to the emergency department.  Pager Numbers  - Dr. Hester: [phone]  - Dr. Jackquline: [phone]  - Dr. Claudene: [phone]   - Dr. Raymund: [phone]  In the event of inclement weather, please call our main line at [phone] for an update on the status of any delays or closures.  Dermatology Medication Tips: Please keep the boxes that topical medications come in in order to help keep track of the instructions about where and how to use these. Pharmacies typically print the medication instructions only on the boxes and not directly on the medication tubes.   If your medication is too expensive, please contact our office at [phone] option 4 or send us  a message through MyChart.   We are unable to tell what your co-pay for medications will be in advance as this is different depending on your insurance coverage. However, we may be able to find a substitute medication at lower cost or fill out paperwork to get insurance to cover a needed medication.   If a prior authorization is required to get your  medication covered by your insurance company, please allow us  1-2 business days to complete this process.  Drug prices often vary depending on where the prescription is filled and some pharmacies may offer cheaper prices.  The website www.goodrx.com contains coupons for medications through different pharmacies. The prices here do not account for what the cost may be with help from insurance (it may be cheaper with your insurance), but the website can give you the price if you did not use any insurance.  - You can print the associated coupon and take it with your prescription to the pharmacy.  - You may also stop by our office during regular business hours and pick up a GoodRx coupon card.  - If you need your prescription sent electronically to a different pharmacy, notify our office through [facility] or by phone at [phone] option 4.     Si Usted Necesita Algo Despus de Su Visita  Tambin puede enviarnos un mensaje a clyda  de MyChart. Por lo general respondemos a los mensajes de MyChart en el transcurso de 1 a 2 das hbiles.  Para renovar recetas, por favor pida a su farmacia que se ponga en contacto con nuestra oficina. Randi lakes de fax es [place] [phone].  Si tiene un asunto urgente cuando la clnica est cerrada y que no puede esperar hasta el siguiente da hbil, puede llamar/localizar a su doctor(a) al nmero que aparece a continuacin.   Por favor, tenga en cuenta que aunque hacemos todo lo posible para estar disponibles para asuntos urgentes fuera del horario de [place], no estamos disponibles las 24 horas del da, los 7 [address]  [address] de la [place].   Si tiene un problema urgente y no puede comunicarse con nosotros, puede optar por buscar atencin mdica  en el consultorio de su doctor(a), en una clnica privada, en un centro de atencin urgente o en una sala de emergencias.  Si tiene Engineer, drilling, por favor llame inmediatamente al 911 o vaya a la sala de  emergencias.  Nmeros de bper  - Dr. Hester: [phone]  - Dra. Jackquline: [phone]  - Dr. Claudene: [phone]  - Dra. Kitts: [phone]  En caso de inclemencias del [place], por favor llame a nuestra lnea principal al [phone] para una actualizacin sobre el estado de cualquier retraso o cierre.  Consejos para la medicacin en dermatologa: Por favor, guarde las cajas en las que vienen los medicamentos de uso tpico para ayudarle a seguir las instrucciones sobre dnde y cmo usarlos. Las farmacias generalmente imprimen las instrucciones del medicamento slo en las cajas y no directamente en los tubos del [employer].   Si su medicamento es muy caro, por favor, pngase en contacto con landry rieger llamando al [phone] y presione la opcin 4 o envenos un mensaje a travs de Clinical cytogeneticist.   No podemos decirle cul ser su copago por los medicamentos por adelantado ya que esto es diferente dependiendo de la cobertura de su seguro. Sin embargo, es posible que podamos encontrar un medicamento sustituto a Audiological scientist un formulario para que el seguro cubra el medicamento que se considera necesario.   Si se requiere una autorizacin previa para que su compaa de seguros malta su medicamento, por favor permtanos de 1 a 2 das hbiles para completar este proceso.  Los precios de los medicamentos varan con frecuencia dependiendo del Environmental consultant de dnde se surte la receta y alguna farmacias pueden ofrecer precios ms baratos.  El sitio web www.goodrx.com tiene cupones para medicamentos de Health and safety inspector. Los precios aqu no tienen en cuenta lo que podra costar con la ayuda del seguro (puede ser ms barato con su seguro), pero el sitio web puede darle el precio si no utiliz Tourist information centre manager.  - Puede imprimir el cupn correspondiente y llevarlo con su receta a la farmacia.  - Tambin puede pasar por nuestra oficina durante el horario de atencin regular y Education officer, museum una tarjeta  de cupones de GoodRx.  - Si necesita que su receta se enve electrnicamente a una farmacia diferente, informe a nuestra oficina a travs de MyChart de [place] o por telfono llamando al [phone] y presione la opcin 4.

## 2023-10-19 NOTE — Progress Notes (Addendum)
 Follow-Up Visit   Subjective  Marissa Salazar is a 43 y.o. female who presents for the following: Skin Cancer Screening and Full Body Skin Exam  The patient presents for Total-Body Skin Exam (TBSE) for skin cancer screening and mole check. The patient has spots, moles and lesions to be evaluated, some may be new or changing and the patient may have concern these could be cancer.  Hx dysplastic nevus. Patient using OTC products for melasma and tretinoin  0.025%. Patient tolerating well and would like to increase strength.   The following portions of the chart were reviewed this encounter and updated as appropriate: medications, allergies, medical history  Review of Systems:  No other skin or systemic complaints except as noted in HPI or Assessment and Plan.  Objective  Well appearing patient in no apparent distress; mood and affect are within normal limits.  A full examination was performed including scalp, head, eyes, ears, nose, lips, neck, chest, axillae, abdomen, back, buttocks, bilateral upper extremities, bilateral lower extremities, hands, feet, fingers, toes, fingernails, and toenails. All findings within normal limits unless otherwise noted below.   Relevant physical exam findings are noted in the Assessment and Plan.   Assessment & Plan   SKIN CANCER SCREENING PERFORMED TODAY.  Telangiectasia R nose. Counseling for BBL / IPL / Laser and Coordination of Care Discussed the treatment option of Broad Band Light (BBL) /Intense Pulsed Light (IPL)/ Laser for skin discoloration, including brown spots and redness.  Typically we recommend at least 1-3 treatment sessions about 5-8 weeks apart for best results.  Cannot have tanned skin when BBL performed, and regular use of sunscreen/photoprotection is advised after the procedure to help maintain results. The patient's condition may also require maintenance treatments in the future.  The fee for BBL / laser treatments is $350 per  treatment session for the whole face.  A fee can be quoted for other parts of the body.  Insurance typically does not pay for BBL/laser treatments and therefore the fee is an out-of-pocket cost. Recommend prophylactic valtrex treatment. Once scheduled for procedure, will send Rx in prior to patient's appointment.   ACTINIC DAMAGE - Chronic condition, secondary to cumulative UV/sun exposure - diffuse scaly erythematous macules with underlying dyspigmentation - Recommend daily broad spectrum sunscreen SPF 30+ to sun-exposed areas, reapply every 2 hours as needed.  - Staying in the shade or wearing long sleeves, sun glasses (UVA+UVB protection) and wide brim hats (4-inch brim around the entire circumference of the hat) are also recommended for sun protection.  - Call for new or changing lesions.  LENTIGINES, SEBORRHEIC KERATOSES, HEMANGIOMAS - Benign normal skin lesions - Benign-appearing - Call for any changes  MELANOCYTIC NEVI - Tan-brown and/or pink-flesh-colored symmetric macules and papules - Benign appearing on exam today - Observation - Call clinic for new or changing moles - Recommend daily use of broad spectrum spf 30+ sunscreen to sun-exposed areas.  - left bicep 1.5 mm regular dark brown macule. Observe. - left UQA costal margin 2 mm regular dark brown macule. Observe. - left medial mid foot 3 mm regular brown macule. Observe. - left dorsal foot proximal to little toe 1.5 mm regular brown macule. Observe.            MELASMA Exam: reticulated hyperpigmented patches at face Chronic condition with duration or expected duration over one year. Currently well-controlled. Melasma is a chronic; persistent condition of hyperpigmented patches generally on the face, worse in summer due to higher UV exposure.  Heredity; thyroid  disease; sun exposure; pregnancy; birth control pills; epilepsy medication and darker skin may predispose to Melasma.   Recommendations include: - Sun  avoidance and daily broad spectrum (UVA/UVB) tinted mineral sunscreen SPF 30+, with Zinc  or Titanium Dioxide. - Rx topical bleaching creams (i.e. hydroquinone ) is a common treatment but should not be used long term.  Hydroquinones may be mixed with retinoids; vitamin C; steroids; Kojic Acid. - Alastin A-luminate, retinoids, vitamin C, topical tranexamic acid , glycolic acid and kojic acid can be used for brightening while on break from hydroquinone  - Rx Azelaic Acid is also a treatment option that is safe for pregnancy (Category B). - OTC Heliocare can be helpful in control and prevention. - Oral Rx with Tranexamic Acid  250 mg - 650 mg po daily can be used for moderate to severe cases especially during summer (contraindications include pregnancy; lactation; hx of PE; hx of DVT; clotting disorder; heart disease; anticoagulant use and upcoming long trips)   - Chemical peels (would need multiple for best result).  - Lasers and  Microdermabrasion may also be helpful adjunct treatments. Treatment Plan: Continue tretinoin  increasing to 0.05% at bedtime as tolerated.  Recommend daily broad spectrum (UVA/UVB) tinted mineral sunscreen SPF 30+, with Zinc  or Titanium Dioxide. Long term medication management.  Patient is using long term (months to years) prescription medication  to control their dermatologic condition.  These medications require periodic monitoring to evaluate for efficacy and side effects and may require periodic laboratory monitoring.  History of Dysplastic Nevi - 2018 right infraaxillary mild atypia - No evidence of recurrence today - Recommend regular full body skin exams - Recommend daily broad spectrum sunscreen SPF 30+ to sun-exposed areas, reapply every 2 hours as needed.  - Call if any new or changing lesions are noted between office visits  SEBORRHEIC DERMATITIS Exam: scale at scalp Chronic and persistent condition with duration or expected duration over one year. Condition is  bothersome/symptomatic for patient. Currently flared. Seborrheic Dermatitis is a chronic persistent rash characterized by pinkness and scaling most commonly of the mid face but also can occur on the scalp (dandruff), ears; mid chest, mid back and groin.  It tends to be exacerbated by stress and cooler weather.  People who have neurologic disease may experience new onset or exacerbation of existing seborrheic dermatitis.  The condition is not curable but treatable and can be controlled. Treatment Plan: Apply ketoconazole  2% shampoo apply three times per week, massage into scalp and leave in for 5-10 minutes before rinsing out. This can be followed by conditioner or shampoo and conditioner of your choice.   MELASMA   Related Medications tretinoin  (RETIN-A ) 0.05 % cream Apply topically at bedtime. SKIN CANCER SCREENING   ACTINIC SKIN DAMAGE   LENTIGO   MELANOCYTIC NEVUS, UNSPECIFIED LOCATION   HISTORY OF DYSPLASTIC NEVUS   SEBORRHEIC DERMATITIS   COUNSELING AND COORDINATION OF CARE   MEDICATION MANAGEMENT   Return in about 1 year (around 10/18/2024) for TBSE, with Dr. MARLA, HxDN.  Marissa Salazar, RMA, am acting as scribe for Alm Rhyme, MD .   Documentation: I have reviewed the above documentation for accuracy and completeness, and I agree with the above.  Alm Rhyme, MD

## 2023-10-23 ENCOUNTER — Encounter: Payer: Self-pay | Admitting: Dermatology

## 2023-10-26 MED ORDER — VALACYCLOVIR HCL 500 MG PO TABS: ORAL_TABLET | ORAL | 1 refills | Status: AC

## 2023-11-14 ENCOUNTER — Other Ambulatory Visit: Payer: Self-pay | Admitting: Obstetrics and Gynecology

## 2023-11-14 ENCOUNTER — Telehealth: Payer: Self-pay | Admitting: Gastroenterology

## 2023-11-14 DIAGNOSIS — Z800 Family history of malignant neoplasm of digestive organs: Secondary | ICD-10-CM

## 2023-11-14 DIAGNOSIS — Z1509 Genetic susceptibility to other malignant neoplasm: Secondary | ICD-10-CM

## 2023-11-14 DIAGNOSIS — Z1239 Encounter for other screening for malignant neoplasm of breast: Secondary | ICD-10-CM

## 2023-11-14 NOTE — Telephone Encounter (Signed)
 Inbound call from patient stating she would like to be advised on if she can have an EUS due to family history of pancreatic cancer or does to have to wait due to her recently having an MRI in 2024. Requesting a call back  Please advise  Thank you

## 2023-11-15 NOTE — Telephone Encounter (Signed)
 Schedule next available Upper EUS. Thanks. GM

## 2023-11-16 ENCOUNTER — Other Ambulatory Visit: Payer: Self-pay

## 2023-11-16 DIAGNOSIS — Z800 Family history of malignant neoplasm of digestive organs: Secondary | ICD-10-CM

## 2023-11-16 NOTE — Telephone Encounter (Signed)
 EUS has been entered for 01/15/24 8 am at [facility] with GM

## 2023-11-16 NOTE — Telephone Encounter (Signed)
 EUS scheduled, pt instructed and medications reviewed.  Patient instructions mailed to home.  Patient to call with any questions or concerns.

## 2023-12-04 ENCOUNTER — Telehealth: Payer: Self-pay | Admitting: Gastroenterology

## 2023-12-04 NOTE — Telephone Encounter (Signed)
 Patient called requesting to reschedule 12/8 EUS. Requesting to know if 12/22 is available. Please advise, thank you

## 2023-12-04 NOTE — Telephone Encounter (Signed)
 The pt appt has been rescheduled to 12/22 at 845 am  New instructions given and sent to My Chart.  She does view and ok to receive there

## 2024-01-09 ENCOUNTER — Encounter: Payer: Self-pay | Admitting: Hematology and Oncology

## 2024-01-16 ENCOUNTER — Ambulatory Visit: Admitting: Dermatology

## 2024-01-17 ENCOUNTER — Encounter: Payer: Self-pay | Admitting: Hematology and Oncology

## 2024-01-18 ENCOUNTER — Encounter: Payer: Self-pay | Admitting: Hematology and Oncology

## 2024-01-20 ENCOUNTER — Inpatient Hospital Stay
Admission: RE | Admit: 2024-01-20 | Discharge: 2024-01-20 | Attending: Obstetrics and Gynecology | Admitting: Obstetrics and Gynecology

## 2024-01-20 DIAGNOSIS — Z1239 Encounter for other screening for malignant neoplasm of breast: Secondary | ICD-10-CM

## 2024-01-20 MED ORDER — GADOPICLENOL 0.5 MMOL/ML IV SOLN
6.0000 mL | Freq: Once | INTRAVENOUS | Status: AC | PRN
  Administered 2024-01-20: 11:00:00 6 mL via INTRAVENOUS

## 2024-01-22 ENCOUNTER — Encounter (HOSPITAL_COMMUNITY): Payer: Self-pay | Admitting: Gastroenterology

## 2024-01-22 ENCOUNTER — Telehealth: Payer: Self-pay | Admitting: Gastroenterology

## 2024-01-22 NOTE — Telephone Encounter (Addendum)
 Procedure:Upper EUS Procedure date: 01/29/24 Procedure location: [facility] Arrival Time: 7:15 am Any prep concerns? No  Has the patient obtained the prep from the pharmacy ? No prep needed Do you have a care partner and transportation: Yes Any additional concerns? No

## 2024-01-29 ENCOUNTER — Ambulatory Visit (HOSPITAL_COMMUNITY)
Admission: RE | Admit: 2024-01-29 | Discharge: 2024-01-29 | Disposition: A | Discharge status: Home / Self Care | Payer: Self-pay | Attending: Gastroenterology | Admitting: Gastroenterology

## 2024-01-29 ENCOUNTER — Encounter (HOSPITAL_COMMUNITY): Payer: Self-pay | Admitting: Gastroenterology

## 2024-01-29 ENCOUNTER — Other Ambulatory Visit: Payer: Self-pay

## 2024-01-29 ENCOUNTER — Telehealth: Payer: Self-pay

## 2024-01-29 ENCOUNTER — Ambulatory Visit (HOSPITAL_COMMUNITY)

## 2024-01-29 ENCOUNTER — Encounter (HOSPITAL_COMMUNITY): Admission: RE | Disposition: A | Payer: Self-pay | Attending: Gastroenterology

## 2024-01-29 DIAGNOSIS — K2289 Other specified disease of esophagus: Secondary | ICD-10-CM | POA: Diagnosis not present

## 2024-01-29 DIAGNOSIS — K2960 Other gastritis without bleeding: Secondary | ICD-10-CM

## 2024-01-29 DIAGNOSIS — K3189 Other diseases of stomach and duodenum: Secondary | ICD-10-CM | POA: Diagnosis not present

## 2024-01-29 DIAGNOSIS — Z801 Family history of malignant neoplasm of trachea, bronchus and lung: Secondary | ICD-10-CM | POA: Diagnosis not present

## 2024-01-29 DIAGNOSIS — K869 Disease of pancreas, unspecified: Secondary | ICD-10-CM

## 2024-01-29 DIAGNOSIS — Z803 Family history of malignant neoplasm of breast: Secondary | ICD-10-CM | POA: Diagnosis not present

## 2024-01-29 DIAGNOSIS — Z1509 Genetic susceptibility to other malignant neoplasm: Secondary | ICD-10-CM | POA: Insufficient documentation

## 2024-01-29 DIAGNOSIS — R519 Headache, unspecified: Secondary | ICD-10-CM | POA: Insufficient documentation

## 2024-01-29 DIAGNOSIS — Z800 Family history of malignant neoplasm of digestive organs: Secondary | ICD-10-CM | POA: Insufficient documentation

## 2024-01-29 DIAGNOSIS — Z9189 Other specified personal risk factors, not elsewhere classified: Secondary | ICD-10-CM | POA: Diagnosis not present

## 2024-01-29 DIAGNOSIS — K259 Gastric ulcer, unspecified as acute or chronic, without hemorrhage or perforation: Secondary | ICD-10-CM | POA: Diagnosis not present

## 2024-01-29 DIAGNOSIS — I899 Noninfective disorder of lymphatic vessels and lymph nodes, unspecified: Secondary | ICD-10-CM | POA: Diagnosis not present

## 2024-01-29 DIAGNOSIS — Z807 Family history of other malignant neoplasms of lymphoid, hematopoietic and related tissues: Secondary | ICD-10-CM | POA: Diagnosis not present

## 2024-01-29 DIAGNOSIS — K824 Cholesterolosis of gallbladder: Secondary | ICD-10-CM | POA: Insufficient documentation

## 2024-01-29 DIAGNOSIS — K828 Other specified diseases of gallbladder: Secondary | ICD-10-CM

## 2024-01-29 DIAGNOSIS — K219 Gastro-esophageal reflux disease without esophagitis: Secondary | ICD-10-CM | POA: Diagnosis not present

## 2024-01-29 DIAGNOSIS — Z1501 Genetic susceptibility to malignant neoplasm of breast: Secondary | ICD-10-CM | POA: Diagnosis not present

## 2024-01-29 DIAGNOSIS — D649 Anemia, unspecified: Secondary | ICD-10-CM | POA: Insufficient documentation

## 2024-01-29 DIAGNOSIS — Q453 Other congenital malformations of pancreas and pancreatic duct: Secondary | ICD-10-CM | POA: Insufficient documentation

## 2024-01-29 DIAGNOSIS — Z1289 Encounter for screening for malignant neoplasm of other sites: Secondary | ICD-10-CM | POA: Diagnosis not present

## 2024-01-29 HISTORY — PX: EUS: SHX5427

## 2024-01-29 SURGERY — ULTRASOUND, UPPER GI TRACT, ENDOSCOPIC
Anesthesia: Monitor Anesthesia Care

## 2024-01-29 MED ORDER — PROPOFOL 500 MG/50ML IV EMUL
INTRAVENOUS | Status: DC | PRN
  Administered 2024-01-29: 150 ug/kg/min via INTRAVENOUS

## 2024-01-29 MED ORDER — PROPOFOL 1000 MG/100ML IV EMUL
INTRAVENOUS | Status: AC
  Filled 2024-01-29: qty 100

## 2024-01-29 MED ORDER — PROPOFOL 10 MG/ML IV BOLUS
INTRAVENOUS | Status: DC | PRN
  Administered 2024-01-29 (×3): 40 mg via INTRAVENOUS

## 2024-01-29 MED ORDER — LIDOCAINE 2% (20 MG/ML) 5 ML SYRINGE
INTRAMUSCULAR | Status: DC | PRN
  Administered 2024-01-29: 40 mg via INTRAVENOUS

## 2024-01-29 MED ORDER — SODIUM CHLORIDE 0.9 % IV SOLN: INTRAVENOUS | Status: DC | PRN

## 2024-01-29 MED ORDER — LACTATED RINGERS IV SOLN
INTRAVENOUS | Status: AC | PRN
  Administered 2024-01-29: 1000 mL via INTRAVENOUS

## 2024-01-29 MED ORDER — PANTOPRAZOLE SODIUM 20 MG PO TBEC: 20.0000 mg | DELAYED_RELEASE_TABLET | Freq: Every day | ORAL | 1 refills | Status: AC

## 2024-01-29 NOTE — Anesthesia Procedure Notes (Signed)
 Procedure Name: MAC Date/Time: 01/29/2024 9:07 AM  Performed by: Judythe Tanda Aran, CRNAPre-anesthesia Checklist: Patient identified, Emergency Drugs available, Suction available and Patient being monitored Patient Re-evaluated:Patient Re-evaluated prior to induction Oxygen Delivery Method: Simple face mask

## 2024-01-29 NOTE — H&P (Signed)
 "  GASTROENTEROLOGY PROCEDURE H&P NOTE   Primary Care Physician: Chinita Hoy CROME, PA-C  HPI: Marissa Salazar is a 43 y.o. female who presents for EGD/EUS for HR Pancreatic Cancer screening in setting of ATM mutation + FHx Pancreatic Cancer.  Past Medical History:  Diagnosis Date   Abnormal Pap smear    in past; normal colpo   Allergic rhinitis    Allergy    Anemia    Hx fibroids   Blood transfusion without reported diagnosis 05/2019   Dysplastic nevus 10/05/2016   R infra axillary, mild atypia   Family history of breast cancer    Family history of colon cancer    Family history of pancreatic cancer    Fibroid    Headache    Iron deficiency anemia due to chronic blood loss    Lab test positive for detection of COVID-19 virus 10/05/2019   Menorrhagia 03/29/2018   Monoallelic mutation of ATM gene    Superficial vein thrombosis 2019   Left leg, while on OCP   Uterine fibroid 05/09/2019   sonohysterogram showing 7.8 x 7.4 cm fibroid with 4.6 cm in endometrial cavity - Dr. Marget   Past Surgical History:  Procedure Laterality Date   ABDOMINAL HYSTERECTOMY  10/30/2019   bilateral salpingectomy - See pathology in Epic:  endometritis, fibroids, salpingitis,.LGSIL of cervix   BREAST BIOPSY Left 06/12/2014   fibroadenoma   BUNIONECTOMY Right 2013   COLPOSCOPY  2009   ESOPHAGOGASTRODUODENOSCOPY N/A 07/17/2015   Procedure: ESOPHAGOGASTRODUODENOSCOPY (EGD);  Surgeon: Lamar ONEIDA Holmes, MD;  Location: [facility] ENDOSCOPY;  Service: Endoscopy;  Laterality: N/A;   No current facility-administered medications for this encounter.   Current Medications[1] Allergies[2] Family History  Problem Relation Age of Onset   Multiple myeloma Father    Breast cancer Maternal Aunt 60       BRCA negative   Pancreatic cancer Maternal Aunt 44   Heart attack Maternal Uncle    Diabetes Paternal Uncle    Colon cancer Maternal Grandmother 9   Breast cancer Maternal Grandmother 54   Hypertension  Maternal Grandmother    Breast cancer Other        GP   Lung cancer Other        GP   Esophageal cancer Neg Hx    Liver cancer Neg Hx    Stomach cancer Neg Hx    Social History   Socioeconomic History   Marital status: Divorced    Spouse name: Not on file   Number of children: 2   Years of education: Not on file   Highest education level: Associate degree: occupational, scientist, product/process development, or vocational program  Occupational History   Occupation: Consulting Civil Engineer    Comment: CMA  Tobacco Use   Smoking status: Never   Smokeless tobacco: Never  Vaping Use   Vaping status: Never Used  Substance and Sexual Activity   Alcohol use: Not Currently    Comment: Rare   Drug use: No   Sexual activity: Not Currently    Birth control/protection: None  Other Topics Concern   Not on file  Social History Narrative   Lives children   Regular exercise   Caffeine- coffee 2 c but stopped   Social Drivers of Health   Tobacco Use: Low Risk (01/29/2024)   Patient History    Smoking Tobacco Use: Never    Smokeless Tobacco Use: Never    Passive Exposure: Not on file  Financial Resource Strain: Low Risk (05/21/2023)   Received from  [facility]   Overall Financial Resource Strain (CARDIA)    Difficulty of Paying Living Expenses: Not hard at all  Food Insecurity: No Food Insecurity (05/21/2023)   Received from [facility]   Epic    Within the past 12 months, you worried that your food would run out before you got the money to buy more.: Never true    Within the past 12 months, the food you bought just didn't last and you didn't have money to get more.: Never true  Transportation Needs: No Transportation Needs (05/21/2023)   Received from [facility] - Transportation    Lack of Transportation (Medical): No    Lack of Transportation (Non-Medical): No  Physical Activity: Sufficiently Active (05/21/2023)   Received from [facility]   Exercise Vital Sign    On average, how many days per week  do you engage in moderate to strenuous exercise (like a brisk walk)?: 3 days    On average, how many minutes do you engage in exercise at this level?: 60 min  Stress: Stress Concern Present (05/21/2023)   Received from [facility] - Occupational Stress Questionnaire    Feeling of Stress : To some extent  Social Connections: Socially Integrated (05/21/2023)   Received from [facility]   Social Network    How would you rate your social network (family, work, friends)?: Good participation with social networks  Intimate Partner Violence: Not At Risk (05/21/2023)   Received from [facility]   HITS    Over the last 12 months how often did your partner physically hurt you?: Never    Over the last 12 months how often did your partner insult you or talk down to you?: Never    Over the last 12 months how often did your partner threaten you with physical harm?: Never    Over the last 12 months how often did your partner scream or curse at you?: Never  Depression (PHQ2-9): Low Risk (05/05/2021)   Depression (PHQ2-9)    PHQ-2 Score: 0  Alcohol Screen: Not on file  Housing: Unknown (06/26/2023)   Received from [facility]   Epic    At any time in the past 12 months, were you homeless or living in a shelter (including now)?: No    Number of Times Moved in the Last Year: Not on file    Unable to Pay for Housing in the Last Year: Not on file  Utilities: Not At Risk (05/21/2023)   Received from [facility] Utilities    Threatened with loss of utilities: No  Health Literacy: Not on file    Physical Exam: Today's Vitals   01/22/24 1230 01/29/24 0751  Weight: 60 kg 59 kg  Height: 5' 4 (1.626 m) 5' 4 (1.626 m)  PainSc:  0-No pain   Body mass index is 22.31 kg/m. GEN: NAD EYE: Sclerae anicteric ENT: MMM CV: Non-tachycardic GI: Soft, NT/ND NEURO:  Alert & Oriented x 3  Lab Results: No results for input(s): WBC, HGB,  HCT, PLT in the last 72 hours. BMET No results for input(s): NA, K, CL, CO2, GLUCOSE, BUN, CREATININE, CALCIUM in the last 72 hours. LFT No results for input(s): PROT, ALBUMIN, AST, ALT, ALKPHOS, BILITOT, BILIDIR, IBILI in the last 72 hours. PT/INR No results for input(s): LABPROT, INR in the last 72 hours.   Impression / Plan: This is a 43 y.o.female who presents for  EGD/EUS for HR Pancreatic Cancer screening in setting of ATM mutation + FHx Pancreatic Cancer.  The risks of an EUS including intestinal perforation, bleeding, infection, aspiration, and medication effects were discussed as was the possibility it may not give a definitive diagnosis if a biopsy is performed.  When a biopsy of the pancreas is done as part of the EUS, there is an additional risk of pancreatitis at the rate of about 1-2%.  It was explained that procedure related pancreatitis is typically mild, although it can be severe and even life threatening, which is why we do not perform random pancreatic biopsies and only biopsy a lesion/area we feel is concerning enough to warrant the risk.   The risks and benefits of endoscopic evaluation/treatment were discussed with the patient and/or family; these include but are not limited to the risk of perforation, infection, bleeding, missed lesions, lack of diagnosis, severe illness requiring hospitalization, as well as anesthesia and sedation related illnesses.  The patient's history has been reviewed, patient examined, no change in status, and deemed stable for procedure.  The patient and/or family was provided an opportunity to ask questions and all were answered.  The patient and/or family is agreeable to proceed.    Aloha Finner, MD [facility] Office # [phone]     [1] No current facility-administered medications for this encounter. [2]  Allergies Allergen Reactions   Ivp Dye [Iodinated  Contrast Media] Hives   "

## 2024-01-29 NOTE — Telephone Encounter (Signed)
-----   Message from Aloha Finner, MD sent at 01/29/2024  9:45 AM EST ----- Regarding: Follow-up VC, Please see EUS report. She will need continued high risk pancreas cancer screening as per our protocol. MRI/MRCP in 1 year under your name. Will put in for the recall on this and EUS in 2 years. I told her to speak with her primary care provider about getting her hemoglobin A1c done this year. Thanks. GM   Darshawn Boateng, Please place MRI/MRCP recall 1 year under Dr. Rennis name. EUS recall in 2 years. Thanks. GM

## 2024-01-29 NOTE — Anesthesia Postprocedure Evaluation (Signed)
"   Anesthesia Post Note  Patient: Viona A Lippe  Procedure(s) Performed: ULTRASOUND, UPPER GI TRACT, ENDOSCOPIC     Patient location during evaluation: PACU Anesthesia Type: MAC Level of consciousness: awake Pain management: pain level controlled Vital Signs Assessment: post-procedure vital signs reviewed and stable Respiratory status: spontaneous breathing Cardiovascular status: blood pressure returned to baseline Postop Assessment: no apparent nausea or vomiting Anesthetic complications: no   No notable events documented.  Last Vitals:  Vitals:   01/29/24 0950 01/29/24 1000  BP: 119/71 115/82  Pulse: 72 (!) 58  Resp: 16 17  Temp:    SpO2: 100% 100%    Last Pain:  Vitals:   01/29/24 1000  TempSrc:   PainSc: 0-No pain                 Katrice Goel T Colhoun      "

## 2024-01-29 NOTE — Op Note (Signed)
 [facility] Patient Name: Marissa Salazar Procedure Date: 01/29/2024 MRN: [id number] Attending MD: Aloha Finner , MD, [id number] Date of Birth: [birth year] CSN: [id number] Age: 43 Admit Type: Outpatient Procedure:                Upper EUS Indications:              Initial EUS exam for pancreatic cancer screening in                            high-risk patient, Genetic mutation associated with                            pancreatic cancer risk identified; ATM mutation                            positive, Family history of pancreatic ductal                            adenocarcinoma in other relative Providers:                Aloha Finner, MD, Robie Breed, RN,                            [facility] [place], Technician, Renay Darner Referring MD:              Medicines:                Monitored Anesthesia Care Complications:            No immediate complications. Estimated Blood Loss:     Estimated blood loss was minimal. Procedure:                Pre-Anesthesia Assessment:                           - Prior to the procedure, a History and Physical                            was performed, and patient medications and                            allergies were reviewed. The patient's tolerance of                            previous anesthesia was also reviewed. The risks                            and benefits of the procedure and the sedation                            options and risks were discussed with the patient.                            All questions were answered, and informed consent  was obtained. [Anticoagulant Agents] [Ijbd Prior to                            Procedure]. [ASA Grade]. After reviewing the risks                            and benefits, the patient was deemed in                            satisfactory condition to undergo the procedure.                           After obtaining informed consent, the  endoscope was                            passed under direct vision. Throughout the                            procedure, the patient's blood pressure, pulse, and                            oxygen saturations were monitored continuously. The                            [id number]-[id number] ([id number]) Olympus endoscope was introduced                            through the mouth, and advanced to the second part                            of duodenum. The [id number]-[id number] ([id number]) Olympus                            duodenoscope was introduced through the mouth, and                            advanced to the area of papilla. The [id number]                            ([id number]) Olympus endosonoscope was introduced                            through the mouth, and advanced to the duodenum for                            ultrasound examination from the esophagus, stomach                            and duodenum. The upper EUS was accomplished                            without difficulty. The patient tolerated the  procedure. Scope In: Scope Out: Findings:      ENDOSCOPIC FINDING: :      No gross lesions were noted in the entire esophagus.      The Z-line was irregular and was found 39 cm from the incisors.      A few dispersed diminutive erosions with no bleeding and no stigmata of       recent bleeding were found in the entire examined stomach. Biopsies were       taken with a cold forceps for histology and Helicobacter pylori testing.      No gross lesions were noted in the duodenal bulb, in the first portion       of the duodenum and in the second portion of the duodenum.      The major papilla and minor papilla were normal.      ENDOSONOGRAPHIC FINDING: :      Pancreas divisum was suspected.      There was no sign of significant endosonographic abnormality in the       pancreatic head, genu of the pancreas, pancreatic body, pancreatic tail       and uncinate process of the pancreas. No  masses, no cysts, no       calcifications, the pancreatic duct was regular in contour.      There was no sign of significant endosonographic abnormality in the       common bile duct and in the common hepatic duct. Ducts of normal caliber       were identified.      3 small 2 to 3 mm polyps identified endosonographically within the       gallbladder.      Endosonographic imaging of the ampulla showed no intramural       (subepithelial) lesion.      Endosonographic imaging in the visualized portion of the liver showed no       mass.      No malignant-appearing lymph nodes were visualized in the celiac region       (level 20), peripancreatic region and porta hepatis region.      Endosonographic imaging in the gastroesophageal junction showed no       intramural (subepithelial) lesion.      The celiac region was visualized.      The esophagus, stomach and duodenum were examined endosonographically. Impression:               EGD impression:                           - No gross lesions in the entire esophagus. Z-line                            irregular, 39 cm from the incisors.                           - Erosive gastropathy with no bleeding and no                            stigmata of recent bleeding. Biopsied.                           - No gross lesions in the duodenal bulb, in the  first portion of the duodenum and in the second                            portion of the duodenum.                           - Normal major papilla and minor papilla.                           EUS impression:                           - Pancreas divisum was suspected.                           - There was no sign of significant pathology in the                            pancreatic head, genu of the pancreas, pancreatic                            body, pancreatic tail and uncinate process of the                            pancreas.                           - There was no sign of  significant pathology in the                            common bile duct and in the common hepatic duct.                           - No evidence of ampullary intramural lesion.                           - 3 gallbladder polyps noted (3 mm or smaller).                           - No malignant-appearing lymph nodes were                            visualized in the celiac region (level 20),                            peripancreatic region and porta hepatis region. Moderate Sedation:      Not Applicable - Patient had care per Anesthesia. Recommendation:           - The patient will be observed post-procedure,                            until all discharge criteria are met.                           - Discharge patient  to home.                           - Patient has a contact number available for                            emergencies. The signs and symptoms of potential                            delayed complications were discussed with the                            patient. Return to normal activities tomorrow.                            Written discharge instructions were provided to the                            patient.                           - Resume previous diet.                           - Initiate Protonix  20 mg daily                           - Await path results.                           - Observe patient's clinical course.                           - For future surveillance for pancreatic cancer,                            alternating upper endoscopic ultrasound and                            cross-sectional imaging is recommended. It is                            recommended that the patient undergo an MRI/MRCP in                            1 year for high risk pancreas cancer screening. She                            should have a hemoglobin A1c performed within the                            next year. If all is well on next imaging, then                            plan  for upper EUS 2 years from now.                           -  For her gallbladder polyps, the current imaging                            plan for high risk pancreas cancer screening cohort                            will suffice more than enough for monitoring of                            this.                           - The findings and recommendations were discussed                            with the patient.                           - The findings and recommendations were discussed                            with the designated responsible adult. Procedure Code(s):        --- Professional ---                           [phone], Esophagogastroduodenoscopy, flexible,                            transoral; with endoscopic ultrasound examination,                            including the esophagus, stomach, and either the                            duodenum or a surgically altered stomach where the                            jejunum is examined distal to the anastomosis                           43239, Esophagogastroduodenoscopy, flexible,                            transoral; with biopsy, single or multiple Diagnosis Code(s):        --- Professional ---                           K22.89, Other specified disease of esophagus                           K31.89, Other diseases of stomach and duodenum                           K82.8, Other specified diseases of gallbladder  I89.9, Noninfective disorder of lymphatic vessels                            and lymph nodes, unspecified                           Z12.89, Encounter for screening for malignant                            neoplasm of other sites                           Z15.09, Genetic susceptibility to other malignant                            neoplasm                           Z80.0, Family history of malignant neoplasm of                            digestive organs CPT copyright 2022 American [employer] Association.  All rights reserved. The codes documented in this report are preliminary and upon coder review may  be revised to meet current compliance requirements. Aloha Finner, MD 01/29/2024 9:44:36 AM Number of Addenda: 0

## 2024-01-29 NOTE — Discharge Instructions (Signed)
 YOU HAD AN ENDOSCOPIC PROCEDURE TODAY: Refer to the procedure report and other information in the discharge instructions given to you for any specific questions about what was found during the examination. If this information does not answer your questions, please call [place] office at [phone] to clarify.   YOU SHOULD EXPECT: Some feelings of bloating in the abdomen. Passage of more gas than usual. Walking can help get rid of the air that was put into your GI tract during the procedure and reduce the bloating. If you had a lower endoscopy (such as a colonoscopy or flexible sigmoidoscopy) you may notice spotting of blood in your stool or on the toilet paper. Some abdominal soreness may be present for a day or two, also.  DIET: Your first meal following the procedure should be a light meal and then it is ok to progress to your normal diet. A half-sandwich or bowl of soup is an example of a good first meal. Heavy or fried foods are harder to digest and may make you feel nauseous or bloated. Drink plenty of fluids but you should avoid alcoholic beverages for 24 hours    ACTIVITY: Your care partner should take you home directly after the procedure. You should plan to take it easy, moving slowly for the rest of the day. You can resume normal activity the day after the procedure however YOU SHOULD NOT DRIVE, use power tools, machinery or perform tasks that involve climbing or major physical exertion for 24 hours (because of the sedation medicines used during the test).   SYMPTOMS TO REPORT IMMEDIATELY: A gastroenterologist can be reached at any hour. Please call [phone]  for any of the following symptoms:    Following upper endoscopy (EGD, EUS, ERCP, esophageal dilation) Vomiting of blood or coffee ground material  New, significant abdominal pain  New, significant chest pain or pain under the shoulder blades  Painful or persistently difficult swallowing  New shortness of breath  Black,  tarry-looking or red, bloody stools  FOLLOW UP:  If any biopsies were taken you will be contacted by phone or by letter within the next 1-3 weeks. Call [phone]  if you have not heard about the biopsies in 3 weeks.  Please also call with any specific questions about appointments or follow up tests.

## 2024-01-29 NOTE — Anesthesia Preprocedure Evaluation (Addendum)
"                                    Anesthesia Evaluation  Patient identified by MRN, date of birth, ID band Patient awake    Reviewed: Allergy & Precautions, NPO status , Patient's Chart, lab work & pertinent test results  History of Anesthesia Complications Negative for: history of anesthetic complications  Airway Mallampati: I  TM Distance: >3 FB Neck ROM: Full    Dental  (+) Teeth Intact, Dental Advisory Given   Pulmonary neg pulmonary ROS   breath sounds clear to auscultation       Cardiovascular negative cardio ROS  Rhythm:Regular Rate:Normal     Neuro/Psych  Headaches    GI/Hepatic ,GERD  Medicated,,ATM mutation + Family Hx of Pancreatic Cancer    Endo/Other  negative endocrine ROS    Renal/GU negative Renal ROS     Musculoskeletal   Abdominal   Peds  Hematology  (+) Blood dyscrasia, anemia   Anesthesia Other Findings ATM Mutation  Reproductive/Obstetrics negative OB ROS                              Anesthesia Physical Anesthesia Plan  ASA: 1  Anesthesia Plan: MAC   Post-op Pain Management:    Induction: Intravenous  PONV Risk Score and Plan: 2 and Propofol  infusion and Treatment may vary due to age or medical condition  Airway Management Planned: Natural Airway and Simple Face Mask  Additional Equipment: None  Intra-op Plan:   Post-operative Plan:   Informed Consent: I have reviewed the patients History and Physical, chart, labs and discussed the procedure including the risks, benefits and alternatives for the proposed anesthesia with the patient or authorized representative who has indicated his/her understanding and acceptance.     Dental advisory given  Plan Discussed with: CRNA  Anesthesia Plan Comments:          Anesthesia Quick Evaluation  "

## 2024-01-29 NOTE — Transfer of Care (Signed)
 Immediate Anesthesia Transfer of Care Note  Patient: Marissa Salazar  Procedure(s) Performed: ULTRASOUND, UPPER GI TRACT, ENDOSCOPIC  Patient Location: Endoscopy Unit  Anesthesia Type:MAC  Level of Consciousness: awake  Airway & Oxygen Therapy: Patient Spontanous Breathing and Patient connected to nasal cannula oxygen  Post-op Assessment: Report given to RN and Post -op Vital signs reviewed and stable  Post vital signs: Reviewed and stable  Last Vitals:  Vitals Value Taken Time  BP    Temp    Pulse    Resp    SpO2      Last Pain:  Vitals:   01/29/24 0751  TempSrc: Temporal  PainSc: 0-No pain         Complications: No notable events documented.

## 2024-01-29 NOTE — Telephone Encounter (Signed)
 Recalls have been entered

## 2024-01-30 ENCOUNTER — Encounter (HOSPITAL_COMMUNITY): Payer: Self-pay | Admitting: Gastroenterology

## 2024-01-30 ENCOUNTER — Ambulatory Visit: Payer: Self-pay | Admitting: Gastroenterology

## 2024-01-30 LAB — SURGICAL PATHOLOGY

## 2024-02-12 ENCOUNTER — Encounter: Payer: Self-pay | Admitting: Hematology and Oncology

## 2024-10-22 ENCOUNTER — Ambulatory Visit: Admitting: Dermatology
